# Patient Record
Sex: Female | Born: 1955 | Race: White | Hispanic: No | Marital: Married | Smoking: Former smoker
Health system: Southern US, Community
[De-identification: ages and names within clinical notes are randomized; demographics above are authoritative.]

## PROBLEM LIST (undated history)

## (undated) DIAGNOSIS — E119 Type 2 diabetes mellitus without complications: Secondary | ICD-10-CM

## (undated) DIAGNOSIS — I5189 Other ill-defined heart diseases: Secondary | ICD-10-CM

## (undated) DIAGNOSIS — M4306 Spondylolysis, lumbar region: Secondary | ICD-10-CM

## (undated) DIAGNOSIS — K259 Gastric ulcer, unspecified as acute or chronic, without hemorrhage or perforation: Secondary | ICD-10-CM

## (undated) DIAGNOSIS — L732 Hidradenitis suppurativa: Secondary | ICD-10-CM

## (undated) DIAGNOSIS — E785 Hyperlipidemia, unspecified: Secondary | ICD-10-CM

## (undated) DIAGNOSIS — I259 Chronic ischemic heart disease, unspecified: Secondary | ICD-10-CM

## (undated) DIAGNOSIS — K219 Gastro-esophageal reflux disease without esophagitis: Secondary | ICD-10-CM

## (undated) DIAGNOSIS — M4302 Spondylolysis, cervical region: Secondary | ICD-10-CM

## (undated) DIAGNOSIS — E669 Obesity, unspecified: Secondary | ICD-10-CM

## (undated) DIAGNOSIS — I240 Acute coronary thrombosis not resulting in myocardial infarction: Secondary | ICD-10-CM

## (undated) DIAGNOSIS — R112 Nausea with vomiting, unspecified: Secondary | ICD-10-CM

## (undated) DIAGNOSIS — M5412 Radiculopathy, cervical region: Secondary | ICD-10-CM

## (undated) DIAGNOSIS — I10 Essential (primary) hypertension: Secondary | ICD-10-CM

## (undated) DIAGNOSIS — M069 Rheumatoid arthritis, unspecified: Secondary | ICD-10-CM

## (undated) DIAGNOSIS — I219 Acute myocardial infarction, unspecified: Secondary | ICD-10-CM

## (undated) DIAGNOSIS — I2510 Atherosclerotic heart disease of native coronary artery without angina pectoris: Secondary | ICD-10-CM

## (undated) DIAGNOSIS — Z9889 Other specified postprocedural states: Secondary | ICD-10-CM

## (undated) HISTORY — DX: Obesity, unspecified: E66.9

## (undated) HISTORY — PX: ABDOMINAL HYSTERECTOMY: SHX81

## (undated) HISTORY — DX: Essential (primary) hypertension: I10

## (undated) HISTORY — DX: Other ill-defined heart diseases: I51.89

## (undated) HISTORY — DX: Rheumatoid arthritis, unspecified: M06.9

## (undated) HISTORY — DX: Atherosclerotic heart disease of native coronary artery without angina pectoris: I25.10

## (undated) HISTORY — DX: Acute myocardial infarction, unspecified: I21.9

## (undated) HISTORY — DX: Hyperlipidemia, unspecified: E78.5

## (undated) SURGERY — LEFT HEART CATH AND CORONARY ANGIOGRAPHY
Anesthesia: Moderate Sedation

---

## 2003-11-16 ENCOUNTER — Ambulatory Visit: Payer: Self-pay | Admitting: Family Medicine

## 2008-07-06 ENCOUNTER — Ambulatory Visit: Payer: Self-pay | Admitting: Family Medicine

## 2009-05-01 ENCOUNTER — Ambulatory Visit: Payer: Self-pay | Admitting: Family Medicine

## 2012-07-16 DIAGNOSIS — M47816 Spondylosis without myelopathy or radiculopathy, lumbar region: Secondary | ICD-10-CM | POA: Insufficient documentation

## 2012-07-16 DIAGNOSIS — L732 Hidradenitis suppurativa: Secondary | ICD-10-CM | POA: Insufficient documentation

## 2012-08-03 DIAGNOSIS — M5412 Radiculopathy, cervical region: Secondary | ICD-10-CM | POA: Insufficient documentation

## 2012-08-29 ENCOUNTER — Emergency Department: Payer: Self-pay | Admitting: Emergency Medicine

## 2012-08-29 LAB — COMPREHENSIVE METABOLIC PANEL
Alkaline Phosphatase: 83 U/L (ref 50–136)
Anion Gap: 9 (ref 7–16)
BUN: 11 mg/dL (ref 7–18)
Bilirubin,Total: 0.3 mg/dL (ref 0.2–1.0)
Calcium, Total: 9.4 mg/dL (ref 8.5–10.1)
EGFR (Non-African Amer.): >60
Glucose: 196 mg/dL — ABNORMAL HIGH (ref 65–99)
Osmolality: 279 (ref 275–301)
Potassium: 3.4 mmol/L — ABNORMAL LOW (ref 3.5–5.1)
SGOT(AST): 29 U/L (ref 15–37)
SGPT (ALT): 41 U/L (ref 12–78)
Sodium: 137 mmol/L (ref 136–145)

## 2012-08-29 LAB — CBC
Platelet: 448 10*3/uL — ABNORMAL HIGH (ref 150–440)
RBC: 4.54 10*6/uL (ref 3.80–5.20)
RDW: 13.8 % (ref 11.5–14.5)

## 2012-08-29 LAB — LIPASE, BLOOD: Lipase: 111 U/L (ref 73–393)

## 2014-05-27 ENCOUNTER — Inpatient Hospital Stay
Admission: EM | Admit: 2014-05-27 | Discharge: 2014-05-31 | DRG: 247 | Disposition: A | Discharge status: Home / Self Care | Payer: Medicare Other | Attending: Internal Medicine | Admitting: Internal Medicine

## 2014-05-27 DIAGNOSIS — I361 Nonrheumatic tricuspid (valve) insufficiency: Secondary | ICD-10-CM | POA: Diagnosis not present

## 2014-05-27 DIAGNOSIS — I200 Unstable angina: Secondary | ICD-10-CM | POA: Diagnosis not present

## 2014-05-27 DIAGNOSIS — E781 Pure hyperglyceridemia: Secondary | ICD-10-CM | POA: Diagnosis present

## 2014-05-27 DIAGNOSIS — E669 Obesity, unspecified: Secondary | ICD-10-CM | POA: Diagnosis present

## 2014-05-27 DIAGNOSIS — Z79899 Other long term (current) drug therapy: Secondary | ICD-10-CM

## 2014-05-27 DIAGNOSIS — I214 Non-ST elevation (NSTEMI) myocardial infarction: Secondary | ICD-10-CM | POA: Diagnosis present

## 2014-05-27 DIAGNOSIS — M069 Rheumatoid arthritis, unspecified: Secondary | ICD-10-CM | POA: Diagnosis present

## 2014-05-27 DIAGNOSIS — I10 Essential (primary) hypertension: Secondary | ICD-10-CM | POA: Diagnosis present

## 2014-05-27 DIAGNOSIS — I2510 Atherosclerotic heart disease of native coronary artery without angina pectoris: Secondary | ICD-10-CM | POA: Diagnosis present

## 2014-05-27 DIAGNOSIS — Z6830 Body mass index (BMI) 30.0-30.9, adult: Secondary | ICD-10-CM | POA: Diagnosis not present

## 2014-05-27 DIAGNOSIS — E785 Hyperlipidemia, unspecified: Secondary | ICD-10-CM | POA: Diagnosis present

## 2014-05-27 HISTORY — DX: Non-ST elevation (NSTEMI) myocardial infarction: I21.4

## 2014-05-27 LAB — BASIC METABOLIC PANEL
Anion Gap: 9 (ref 7–16)
BUN: 16 mg/dL
Calcium, Total: 9.5 mg/dL
Chloride: 97 mmol/L — ABNORMAL LOW
Co2: 31 mmol/L
Creatinine: 0.79 mg/dL
GLUCOSE: 243 mg/dL — AB
Potassium: 4.9 mmol/L
Sodium: 137 mmol/L

## 2014-05-27 LAB — CBC
HCT: 41.6 % (ref 35.0–47.0)
HGB: 13.4 g/dL (ref 12.0–16.0)
MCH: 28.5 pg (ref 26.0–34.0)
MCHC: 32.2 g/dL (ref 32.0–36.0)
MCV: 89 fL (ref 80–100)
Platelet: 467 10*3/uL — ABNORMAL HIGH (ref 150–440)
RBC: 4.69 10*6/uL (ref 3.80–5.20)
RDW: 13.5 % (ref 11.5–14.5)
WBC: 17.3 10*3/uL — ABNORMAL HIGH (ref 3.6–11.0)

## 2014-05-27 LAB — TROPONIN I
Troponin-I: 0.2 ng/mL — ABNORMAL HIGH
Troponin-I: 0.62 ng/mL — ABNORMAL HIGH

## 2014-05-27 LAB — CK-MB
CK-MB: 3.7 ng/mL
CK-MB: 4.4 ng/mL

## 2014-05-28 DIAGNOSIS — I361 Nonrheumatic tricuspid (valve) insufficiency: Secondary | ICD-10-CM

## 2014-05-28 DIAGNOSIS — I214 Non-ST elevation (NSTEMI) myocardial infarction: Secondary | ICD-10-CM | POA: Diagnosis present

## 2014-05-28 LAB — HEMOGLOBIN A1C: Hemoglobin A1C: 7.3 % — ABNORMAL HIGH

## 2014-05-28 LAB — LIPID PANEL
Cholesterol: 324 mg/dL — ABNORMAL HIGH
HDL Cholesterol: 36 mg/dL — ABNORMAL LOW
TRIGLYCERIDES: 658 mg/dL — AB

## 2014-05-28 LAB — TROPONIN I: Troponin-I: 0.81 ng/mL — ABNORMAL HIGH

## 2014-05-28 LAB — CK-MB: CK-MB: 5.3 ng/mL — ABNORMAL HIGH

## 2014-05-28 MED ORDER — ATENOLOL 50 MG PO TABS
50.0000 mg | ORAL_TABLET | Freq: Every day | ORAL | Status: DC
  Administered 2014-05-29 – 2014-05-31 (×3): 50 mg via ORAL
  Filled 2014-05-28 (×3): qty 1

## 2014-05-28 MED ORDER — SIMVASTATIN 40 MG PO TABS
40.0000 mg | ORAL_TABLET | Freq: Every day | ORAL | Status: DC
  Administered 2014-05-29 – 2014-05-30 (×2): 40 mg via ORAL
  Filled 2014-05-28 (×2): qty 1

## 2014-05-28 MED ORDER — LEFLUNOMIDE 10 MG PO TABS
10.0000 mg | ORAL_TABLET | Freq: Every day | ORAL | Status: DC
  Administered 2014-05-29 – 2014-05-31 (×2): 10 mg via ORAL

## 2014-05-28 MED ORDER — INSULIN ASPART 100 UNIT/ML ~~LOC~~ SOLN
0.0000 [IU] | Freq: Three times a day (TID) | SUBCUTANEOUS | Status: DC
  Administered 2014-05-29 – 2014-05-30 (×3): 2 [IU] via SUBCUTANEOUS
  Filled 2014-05-28 (×2): qty 2

## 2014-05-28 MED ORDER — SODIUM CHLORIDE 0.9 % IJ SOLN: 10.0000 mL | INTRAMUSCULAR | Status: DC | PRN

## 2014-05-28 MED ORDER — SUCRALFATE 1 G PO TABS
1.0000 g | ORAL_TABLET | Freq: Three times a day (TID) | ORAL | Status: DC
  Administered 2014-05-29 – 2014-05-31 (×7): 1 g via ORAL
  Filled 2014-05-28 (×7): qty 1

## 2014-05-28 MED ORDER — SENNA 8.6 MG PO TABS
1.0000 | ORAL_TABLET | Freq: Two times a day (BID) | ORAL | Status: DC | PRN
  Filled 2014-05-28: qty 1

## 2014-05-28 MED ORDER — HYDROCODONE-ACETAMINOPHEN 5-325 MG PO TABS
1.0000 | ORAL_TABLET | ORAL | Status: DC | PRN
  Administered 2014-05-29 – 2014-05-30 (×4): 1 via ORAL
  Filled 2014-05-28 (×4): qty 1

## 2014-05-28 MED ORDER — ADULT MULTIVITAMIN W/MINERALS CH
1.0000 | ORAL_TABLET | Freq: Every day | ORAL | Status: DC
  Administered 2014-05-29 – 2014-05-31 (×2): 1 via ORAL
  Filled 2014-05-28 (×4): qty 1

## 2014-05-28 MED ORDER — GABAPENTIN 300 MG PO CAPS
600.0000 mg | ORAL_CAPSULE | Freq: Two times a day (BID) | ORAL | Status: DC
  Administered 2014-05-29 – 2014-05-31 (×5): 600 mg via ORAL
  Filled 2014-05-28 (×5): qty 2

## 2014-05-28 MED ORDER — DOCUSATE SODIUM 100 MG PO CAPS: 100.0000 mg | ORAL_CAPSULE | Freq: Two times a day (BID) | ORAL | Status: DC | PRN

## 2014-05-28 MED ORDER — PANTOPRAZOLE SODIUM 40 MG PO TBEC
40.0000 mg | DELAYED_RELEASE_TABLET | Freq: Every day | ORAL | Status: DC
  Administered 2014-05-29 – 2014-05-31 (×3): 40 mg via ORAL
  Filled 2014-05-28 (×3): qty 1

## 2014-05-28 MED ORDER — ACETAMINOPHEN 325 MG PO TABS: 650.0000 mg | ORAL_TABLET | ORAL | Status: DC | PRN

## 2014-05-28 MED ORDER — NITROGLYCERIN 0.1 MG/HR TD PT24
0.1000 mg | MEDICATED_PATCH | Freq: Every day | TRANSDERMAL | Status: DC
  Administered 2014-05-29 – 2014-05-31 (×3): 0.1 mg via TRANSDERMAL
  Filled 2014-05-28 (×5): qty 1

## 2014-05-28 MED ORDER — ENOXAPARIN SODIUM 100 MG/ML ~~LOC~~ SOLN
90.0000 mg | Freq: Two times a day (BID) | SUBCUTANEOUS | Status: DC
Start: 2014-05-29 — End: 2014-05-30
  Administered 2014-05-29 (×2): 90 mg via SUBCUTANEOUS
  Filled 2014-05-28 (×2): qty 1

## 2014-05-28 MED ORDER — CYCLOBENZAPRINE HCL 10 MG PO TABS
10.0000 mg | ORAL_TABLET | Freq: Two times a day (BID) | ORAL | Status: DC | PRN
  Administered 2014-05-29 – 2014-05-30 (×3): 10 mg via ORAL
  Filled 2014-05-28 (×3): qty 1

## 2014-05-28 MED ORDER — ONDANSETRON HCL 4 MG/2ML IJ SOLN: 4.0000 mg | INTRAMUSCULAR | Status: DC | PRN

## 2014-05-29 DIAGNOSIS — I249 Acute ischemic heart disease, unspecified: Secondary | ICD-10-CM

## 2014-05-29 LAB — GLUCOSE, CAPILLARY: GLUCOSE-CAPILLARY: 144 mg/dL — AB (ref 70–99)

## 2014-05-29 MED ORDER — SODIUM CHLORIDE 0.9 % IJ SOLN
3.0000 mL | Freq: Two times a day (BID) | INTRAMUSCULAR | Status: DC
Start: 2014-05-29 — End: 2014-05-30
  Administered 2014-05-29 (×2): 3 mL via INTRAVENOUS

## 2014-05-29 MED ORDER — SODIUM CHLORIDE 0.9 % IV SOLN: 1.0000 mL/kg/h | INTRAVENOUS | Status: DC

## 2014-05-29 MED ORDER — SODIUM CHLORIDE 0.9 % IV SOLN
3.0000 mL/kg/h | INTRAVENOUS | Status: AC
  Administered 2014-05-30: 3 mL/kg/h via INTRAVENOUS

## 2014-05-29 MED ORDER — SODIUM CHLORIDE 0.9 % IV SOLN: 250.0000 mL | INTRAVENOUS | Status: DC | PRN

## 2014-05-29 MED ORDER — ASPIRIN 81 MG PO CHEW
81.0000 mg | CHEWABLE_TABLET | ORAL | Status: AC
  Administered 2014-05-30: 81 mg via ORAL
  Filled 2014-05-29: qty 1

## 2014-05-29 NOTE — Progress Notes (Signed)
Subjective ;59 year old female patient admitted for non-Q-wave MI. She is been chest pain-free since admission. Seen by cardiology, possible cardiac catheter tomorrow morning.:  Continue her aspirin, beta blockers, statins, nitrates and full dose Lovenox. Patient wants to go to and see if she requires a bypass surgery. Denies any other complaints.   Objective: Vital signs in last 24 hours: Temp:  [98 F (36.7 C)] 98 F (36.7 C) (05/01 0457) Pulse Rate:  [80] 80 (05/01 0457) BP: (154)/(80) 154/80 mmHg (05/01 0457) SpO2:  [98 %] 98 % (05/01 0457) Weight:  [89.54 kg (197 lb 6.4 oz)-91.808 kg (202 lb 6.4 oz)] 89.54 kg (197 lb 6.4 oz) (05/01 0457) Weight change:     Intake/Output from previous day:   Intake/Output this shift:    General appearance: cooperative Head: Normocephalic, without obvious abnormality, atraumatic Resp: clear to auscultation bilaterally Cardio: regular rate and rhythm, S1, S2 normal, no murmur, click, rub or gallop Skin: Skin color, texture, turgor normal. No rashes or lesions  Lab Results:  Recent Labs  05/27/14 1717  WBC 17.3*  HGB 13.4  HCT 41.6  PLT 467*   BMET  Recent Labs  05/27/14 1717  CO2 31  BUN 16  CREATININE 0.79  CALCIUM 9.5    Studies/Results: Dg Chest Port 1 View  05/27/2014   CLINICAL DATA:  Chest pain radiating up into jaw and down into left arm for 2 days  EXAM: PORTABLE CHEST - 1 VIEW  COMPARISON:  None.  FINDINGS: The heart size and mediastinal contours are within normal limits. Both lungs are clear. The visualized skeletal structures are unremarkable.  IMPRESSION: No active disease.   Electronically Signed   By: Skipper Cliche M.D.   On: 05/27/2014 19:49    Medications: I have reviewed the patient's current medications.  Assessment/Plan: 59 year old female patient with non-Q-wave MI: Continue aspirin, beta blockers, statins, nitrates, full dose Lovenox. Continue monitoring on telemetry check echocardiogram results,  possible cardiac catheter tomorrow morning. Rheumatoid arthritis rheumatoid arthritis: Continue her home medication mainly leflunomide, prednisone, methotrexate. Leg cramps. Follow up with her rheumatologist. Conservative management.   LOS: 2 days   Harry Shuck 05/29/2014, 8:55 AM

## 2014-05-29 NOTE — Progress Notes (Signed)
Pt remain alert and oriented, compliants of pain assoc with RA, OOB with minimal assist, daughters at bedside, cardiac cath tomorrow, SR on telemetry.

## 2014-05-29 NOTE — Care Management Note (Signed)
Case Management Note  Patient Details  Name: IZETTA SAKAMOTO MRN: [id number] Date of Birth: Jul 29, 1955  Subjective/Objective:                     Order present for CM assessment for discharge planning.  Patient admitted with chest pain.  Troponins elevated and it is documented patient has had nstemi.  Cardiology consult performed and cardiac cath is pending   Patient presents from home and independent in all adls.   Has health insurance.  Denies issues accessing medical care, obtaining medications, maintaining housing, utilities and food.     Action/Plan:       Await cath result and watch for need to anticoagulation meds   Expected Discharge Date:    05/30/2014 if cath does not require intervention              Expected Discharge Plan:     In-House Referral:     Discharge planning Services     None identified at present  Post Acute Care Choice:    Choice offered to:     DME Arranged:    DME Agency:     HH Arranged:    [place] Agency:     Status of Service:     Medicare Important Message Given:    Date Medicare IM Given:    Medicare IM give by:    Date Additional Medicare IM Given:    Additional Medicare Important Message give by:     If discussed at [place] of Stay Meetings, dates discussed:    Additional Comments:  Katrina Stack, RN 05/29/2014, 3:17 PM

## 2014-05-29 NOTE — Progress Notes (Addendum)
Patient ID: Karen Cervantes, female   DOB: 1955-07-20, 59 y.o.   MRN: [id number]    Patient Name: Karen Cervantes Date of Encounter: 05/29/2014     Active Problems:   Acute coronary syndrome    SUBJECTIVE  No additional chest or jaw pain or sob.  CURRENT MEDS . atenolol  50 mg Oral Daily  . enoxaparin (LOVENOX) injection  90 mg Subcutaneous Q12H  . gabapentin  600 mg Oral BID  . insulin aspart  0-10 Units Subcutaneous TID AC & HS  . leflunomide  10 mg Oral Daily  . multivitamin with minerals  1 tablet Oral Daily  . nitroGLYCERIN  0.1 mg Transdermal Q0600  . pantoprazole  40 mg Oral Daily  . simvastatin  40 mg Oral QHS  . sucralfate  1 g Oral TID AC & HS    OBJECTIVE  Filed Vitals:   05/28/14 0906 05/29/14 0457 05/29/14 1145  BP:  154/80 125/72  Pulse:  80 73  Temp:  98 F (36.7 C) 98.3 F (36.8 C)  TempSrc:  Oral Oral  Resp:   19  Height: 5\' 3"  (1.6 m)    Weight: 202 lb 6.4 oz (91.808 kg) 197 lb 6.4 oz (89.54 kg)   SpO2:  98% 96%    Intake/Output Summary (Last 24 hours) at 05/29/14 1230 Last data filed at 05/29/14 0840  Gross per 24 hour  Intake    240 ml  Output      0 ml  Net    240 ml   Filed Weights   05/28/14 0906 05/29/14 0457  Weight: 202 lb 6.4 oz (91.808 kg) 197 lb 6.4 oz (89.54 kg)    PHYSICAL EXAM  General: Pleasant, NAD. Neuro: Alert and oriented X 3. Moves all extremities spontaneously. Psych: Normal affect. HEENT:  Normal  Neck: Supple without bruits or JVD. Lungs:  Resp regular and unlabored, CTA. Heart: RRR no s3, s4, or murmurs. Abdomen: Soft, non-tender, non-distended, BS + x 4.  Extremities: No clubbing, cyanosis or edema. DP/PT/Radials 2+ and equal bilaterally.  Accessory Clinical Findings  CBC  Recent Labs  05/27/14 1717  WBC 17.3*  HGB 13.4  HCT 41.6  MCV 89  PLT 637*   Basic Metabolic Panel  Recent Labs  05/27/14 1717  CO2 31  BUN 16  CREATININE 0.79  CALCIUM 9.5   Liver Function Tests No results for  input(s): AST, ALT, ALKPHOS, BILITOT, PROT, ALBUMIN in the last 72 hours. No results for input(s): LIPASE, AMYLASE in the last 72 hours. Cardiac Enzymes No results for input(s): CKTOTAL, CKMB, CKMBINDEX, TROPONINI in the last 72 hours. BNP Invalid input(s): POCBNP D-Dimer No results for input(s): DDIMER in the last 72 hours. Hemoglobin A1C No results for input(s): HGBA1C in the last 72 hours. Fasting Lipid Panel No results for input(s): CHOL, HDL, LDLCALC, TRIG, CHOLHDL, LDLDIRECT in the last 72 hours. Thyroid Function Tests No results for input(s): TSH, T4TOTAL, T3FREE, THYROIDAB in the last 72 hours.  Invalid input(s): FREET3  TELE  NSR  Radiology/Studies  Dg Chest Port 1 View  05/27/2014   CLINICAL DATA:  Chest pain radiating up into jaw and down into left arm for 2 days  EXAM: PORTABLE CHEST - 1 VIEW  COMPARISON:  None.  FINDINGS: The heart size and mediastinal contours are within normal limits. Both lungs are clear. The visualized skeletal structures are unremarkable.  IMPRESSION: No active disease.   Electronically Signed   By: Elodia Florence.D.  On: 05/27/2014 19:49    ASSESSMENT AND PLAN  1. USA/NSTEMI - she is pain free. Will plan left heart cath tomorrow. She will continue her current meds. 2. Dyslipidemia - her cholesterol and triglycerides are very high. Will start statin therapy.  3. Obesity - weight loss has been discussed. She is limited in her activity by arthritis.   Gregg Taylor,M.D.  05/29/2014 12:30 PM    Echocardiogram: Normal LV function, no WMA, EF >00% Diastolic relaxation ABN noted No significant valve pathology Normal RVSP  Esmond Plants, M.D.

## 2014-05-29 NOTE — Progress Notes (Signed)
Pt is alert and oriented. Pt family had multiple questions and concerns about pt status. Family was unaware of when pt will be doing cardiac cath and family wishes for pt to go to [facility] for surgery. All family and pt questions and concern were addressed. Pt report pain once during shift. No other signs of distress noted. Will continue to monitor.

## 2014-05-30 ENCOUNTER — Other Ambulatory Visit: Payer: Self-pay

## 2014-05-30 ENCOUNTER — Encounter
Admission: EM | Disposition: A | Discharge status: Home / Self Care | Payer: Medicare Other | Source: Home / Self Care | Attending: Internal Medicine

## 2014-05-30 DIAGNOSIS — E785 Hyperlipidemia, unspecified: Secondary | ICD-10-CM

## 2014-05-30 DIAGNOSIS — I2510 Atherosclerotic heart disease of native coronary artery without angina pectoris: Secondary | ICD-10-CM

## 2014-05-30 HISTORY — PX: CARDIAC CATHETERIZATION: SHX172

## 2014-05-30 LAB — GLUCOSE, CAPILLARY
GLUCOSE-CAPILLARY: 120 mg/dL — AB (ref 70–99)
GLUCOSE-CAPILLARY: 124 mg/dL — AB (ref 70–99)
Glucose-Capillary: 150 mg/dL — ABNORMAL HIGH (ref 70–99)
Glucose-Capillary: 164 mg/dL — ABNORMAL HIGH (ref 70–99)

## 2014-05-30 SURGERY — LEFT HEART CATH AND CORONARY ANGIOGRAPHY
Anesthesia: Moderate Sedation

## 2014-05-30 MED ORDER — NITROGLYCERIN 5 MG/ML IV SOLN
INTRAVENOUS | Status: AC
  Filled 2014-05-30: qty 10

## 2014-05-30 MED ORDER — ASPIRIN 81 MG PO CHEW
CHEWABLE_TABLET | ORAL | Status: AC
  Filled 2014-05-30: qty 3

## 2014-05-30 MED ORDER — BIVALIRUDIN 250 MG IV SOLR
INTRAVENOUS | Status: AC
  Filled 2014-05-30: qty 250

## 2014-05-30 MED ORDER — IOHEXOL 350 MG/ML SOLN
INTRAVENOUS | Status: DC | PRN
  Administered 2014-05-30 (×3): 75 mL via INTRA_ARTERIAL

## 2014-05-30 MED ORDER — SODIUM CHLORIDE 0.9 % IJ SOLN: 3.0000 mL | INTRAMUSCULAR | Status: DC | PRN

## 2014-05-30 MED ORDER — ASPIRIN 81 MG PO CHEW
CHEWABLE_TABLET | ORAL | Status: DC | PRN
  Administered 2014-05-30: 243 mg via ORAL

## 2014-05-30 MED ORDER — HEPARIN SODIUM (PORCINE) 1000 UNIT/ML IJ SOLN
INTRAMUSCULAR | Status: AC
  Filled 2014-05-30: qty 1

## 2014-05-30 MED ORDER — VERAPAMIL HCL 2.5 MG/ML IV SOLN
INTRAVENOUS | Status: AC
  Filled 2014-05-30: qty 2

## 2014-05-30 MED ORDER — SODIUM CHLORIDE 0.9 % IV SOLN
250.0000 mL | INTRAVENOUS | Status: DC | PRN
  Administered 2014-05-30: 250 mL via INTRAVENOUS

## 2014-05-30 MED ORDER — NITROGLYCERIN IN D5W 200-5 MCG/ML-% IV SOLN: 0.0000 ug/min | INTRAVENOUS | Status: DC

## 2014-05-30 MED ORDER — SODIUM CHLORIDE 0.9 % WEIGHT BASED INFUSION
1.0000 mL/kg/h | INTRAVENOUS | Status: AC
  Administered 2014-05-30: 1 mL/kg/h via INTRAVENOUS

## 2014-05-30 MED ORDER — FENTANYL CITRATE (PF) 100 MCG/2ML IJ SOLN
INTRAMUSCULAR | Status: DC | PRN
  Administered 2014-05-30: 50 ug via INTRAVENOUS

## 2014-05-30 MED ORDER — TICAGRELOR 90 MG PO TABS
90.0000 mg | ORAL_TABLET | Freq: Two times a day (BID) | ORAL | Status: DC
  Administered 2014-05-30 – 2014-05-31 (×2): 90 mg via ORAL
  Filled 2014-05-30 (×2): qty 1

## 2014-05-30 MED ORDER — FENTANYL CITRATE (PF) 100 MCG/2ML IJ SOLN
INTRAMUSCULAR | Status: AC
  Filled 2014-05-30: qty 2

## 2014-05-30 MED ORDER — TICAGRELOR 90 MG PO TABS
ORAL_TABLET | ORAL | Status: AC
  Filled 2014-05-30: qty 2

## 2014-05-30 MED ORDER — MIDAZOLAM HCL 2 MG/2ML IJ SOLN
INTRAMUSCULAR | Status: DC | PRN
  Administered 2014-05-30: 1 mg via INTRAVENOUS

## 2014-05-30 MED ORDER — SODIUM CHLORIDE 0.9 % IJ SOLN
3.0000 mL | Freq: Two times a day (BID) | INTRAMUSCULAR | Status: DC
  Administered 2014-05-31: 3 mL via INTRAVENOUS
  Filled 2014-05-30: qty 10

## 2014-05-30 MED ORDER — VERAPAMIL HCL 2.5 MG/ML IV SOLN
INTRAVENOUS | Status: DC | PRN
  Administered 2014-05-30: 2.5 mg via INTRA_ARTERIAL

## 2014-05-30 MED ORDER — NITROGLYCERIN 0.2 MG/ML ON CALL CATH LAB
INTRAVENOUS | Status: DC | PRN
  Administered 2014-05-30: 200 ug via INTRAVENOUS

## 2014-05-30 MED ORDER — TICAGRELOR 90 MG PO TABS
ORAL_TABLET | ORAL | Status: DC | PRN
  Administered 2014-05-30: 180 mg via ORAL

## 2014-05-30 MED ORDER — BIVALIRUDIN BOLUS VIA INFUSION
INTRAVENOUS | Status: DC | PRN
  Administered 2014-05-30: 66.45 mg via INTRAVENOUS

## 2014-05-30 MED ORDER — SODIUM CHLORIDE 0.9 % IV SOLN
INTRAVENOUS | Status: DC | PRN
  Administered 2014-05-30: 250 mL via INTRAVENOUS

## 2014-05-30 MED ORDER — LIDOCAINE HCL (PF) 1 % IJ SOLN
INTRAMUSCULAR | Status: AC
  Filled 2014-05-30: qty 30

## 2014-05-30 MED ORDER — METOPROLOL TARTRATE 1 MG/ML IV SOLN
INTRAVENOUS | Status: AC
Start: 2014-05-30 — End: 2014-05-31
  Filled 2014-05-30: qty 5

## 2014-05-30 MED ORDER — HEPARIN (PORCINE) IN NACL 2-0.9 UNIT/ML-% IJ SOLN
INTRAMUSCULAR | Status: AC
  Filled 2014-05-30: qty 1000

## 2014-05-30 MED ORDER — MIDAZOLAM HCL 2 MG/2ML IJ SOLN
INTRAMUSCULAR | Status: AC
  Filled 2014-05-30: qty 2

## 2014-05-30 MED ORDER — SODIUM CHLORIDE 0.9 % IV SOLN
250.0000 mg | INTRAVENOUS | Status: DC | PRN
  Administered 2014-05-30: 1.75 mg/kg/h via INTRAVENOUS

## 2014-05-30 MED ORDER — HEPARIN SODIUM (PORCINE) 5000 UNIT/ML IJ SOLN
5000.0000 [IU] | Freq: Three times a day (TID) | INTRAMUSCULAR | Status: DC
  Administered 2014-05-30 – 2014-05-31 (×2): 5000 [IU] via SUBCUTANEOUS
  Filled 2014-05-30 (×2): qty 1

## 2014-05-30 MED ORDER — METOPROLOL TARTRATE 1 MG/ML IV SOLN
INTRAVENOUS | Status: DC | PRN
  Administered 2014-05-30: 2.5 mg via INTRAVENOUS

## 2014-05-30 SURGICAL SUPPLY — 22 items
BALLN TREK RX 2.5X12 (BALLOONS) ×2
BALLN ~~LOC~~ TREK RX 3.75X12 (BALLOONS) ×2
BALLOON TREK RX 2.5X12 (BALLOONS) ×1 IMPLANT
BALLOON ~~LOC~~ TREK RX 3.75X12 (BALLOONS) ×1 IMPLANT
CATH INFINITI 5 FR 3DRC (CATHETERS) ×2 IMPLANT
CATH INFINITI 5FR ANG PIGTAIL (CATHETERS) IMPLANT
CATH INFINITI 5FR JL4 (CATHETERS) ×2 IMPLANT
CATH INFINITI JR4 5F (CATHETERS) ×2 IMPLANT
CATH OPTITORQUE JACKY 4.0 5F (CATHETERS) ×2 IMPLANT
CATH VISTA GUIDE 6FR XBLAD3.5 (CATHETERS) ×2 IMPLANT
DEVICE PRESTO INFLATION (MISCELLANEOUS) ×2 IMPLANT
DEVICE RAD TR BAND REGULAR (VASCULAR PRODUCTS) ×2 IMPLANT
GLIDESHEATH SLEND SS 6F .021 (SHEATH) ×2 IMPLANT
NEEDLE PERC 18GX7CM (NEEDLE) ×2 IMPLANT
PACK CARDIAC CATH (CUSTOM PROCEDURE TRAY) ×2 IMPLANT
SHEATH PINNACLE 5F 10CM (SHEATH) ×2 IMPLANT
SHEATH PINNACLE 6F 10CM (SHEATH) ×2 IMPLANT
STENT XIENCE ALPINE RX 3.5X18 (Permanent Stent) ×2 IMPLANT
WIRE EMERALD 3MM-J .035X150CM (WIRE) ×2 IMPLANT
WIRE HITORQ VERSACORE ST 145CM (WIRE) ×2 IMPLANT
WIRE RUNTHROUGH .014X180CM (WIRE) ×2 IMPLANT
WIRE SAFE-T 1.5MM-J .035X260CM (WIRE) ×2 IMPLANT

## 2014-05-30 NOTE — Care Management (Signed)
Informed that patient's daughters needed to speak with CM.  They mention that they have been wanting patient to transfer to [facility] as patient's specialty physician practices at that facility.  Says they asked at time of admission.  At time of conversation, patient had just finished cardiac cath resulting in PCI of the RCA.  Daughters are no longer interested in transferring as patient is most likely going to be discharged home tomorrow.

## 2014-05-30 NOTE — H&P (Signed)
PATIENT NAME:  Karen Cervantes, Karen Cervantes MR#:  [id number] DATE OF BIRTH:  Dec 21, 1955  DATE OF ADMISSION:  05/27/2014   PRIMARY CARE PHYSICIAN: Dr. Denton Lank at the [facility].   CHIEF COMPLAINT: Chest pain today.  HISTORY OF PRESENT ILLNESS: Karen Cervantes is a very pleasant, 59 year old, Caucasian female with past medical history of hypertension and rheumatoid arthritis, who comes to the Emergency Room after she started having feelings of chest soreness and heaviness across the chest and radiated to her jaw this afternoon. The patient denies any exertional activity. Denies diaphoresis or shortness of breath. Her chest pain resolved after she took a couple of baby aspirins. She came to the Emergency Room, her troponin is 0.20. EKG shows normal sinus rhythm with left atrial enlargement. Given her symptoms, risk factors and history of hypertension, she is being admitted with an acute STEMI.   PAST MEDICAL HISTORY: 1. Hypertension.  2. Rheumatoid arthritis. Her rheumatologist is at [facility].   ALLERGIES: REMICADE.   MEDICATIONS:  1. Xeljanz 5 mg p.o. b.i.d.  2. Prednisone 5 mg 2 tablets p.o. daily.  3. Omeprazole 20 mg 1 capsule p.o. daily.  4. Multivitamin 1 capsule daily.  5. Methotrexate 0.4 mL subcutaneously once a week. 6. Leflunomide 20 mg p.o. daily in the morning.  7. Gabapentin 300 mg 2 capsules b.i.d.  8. Folic acid 1 tablet p.o. daily.  9. Cyclobenzaprine 10 mg 1 tablet b.i.d.  10. Atenolol 50 mg p.o. daily.  11. Acetaminophen/hydrocodone 5/325 one tablet every 4 hours   REVIEW OF SYSTEMS:    CONSTITUTIONAL: No fever, fatigue, or weakness.  EYES: No blurred or double vision, or cataract.  ENT: No tinnitus, ear pain, or postnasal drip.  RESPIRATORY: No cough, wheeze, hemoptysis.  CARDIOVASCULAR: Positive chest pain, which resolved, hypertension, no dyspnea on exertion, palpitations.  GASTROINTESTINAL: No nausea, vomiting, diarrhea, abdominal pain.  GENITOURINARY: No dysuria, hematuria,  frequency.  ENDOCRINE: No polyuria, nocturia or thyroid problems.  HEMATOLOGY: No anemia, easy bruising or bleeding.  SKIN: No acne, rash, or lesion.  MUSCULOSKELETAL: Positive for arthritis. No swelling or gout.  NEUROLOGIC: No CVA, TIA, ataxia, or dementia.  PSYCHIATRIC: No anxiety, depression, or bipolar disorder. All other systems reviewed and negative.   PHYSICAL EXAMINATION:  GENERAL: The patient is awake, alert, oriented x 3, not in acute distress.  VITAL SIGNS: Afebrile. Pulse is 79, blood pressure is 179/86. Saturations are 97% on room air.  HEENT: Atraumatic, normocephalic. PERRLA. EOMI intact. Oral mucosa is moist.  NECK: Supple. No JVD. No carotid bruit.  RESPIRATORY: Clear to auscultation bilaterally. No rales, rhonchi, respiratory distress, or labored breathing.  CARDIOVASCULAR: Both the heart sounds are normal rate, rhythm regular. PMI not lateralized. Chest is nontender.  EXTREMITIES: Good pedal pulses, good femoral pulses. No lower extremity edema.  ABDOMEN: Soft, benign, nontender. No organomegaly. Positive bowel sounds.  NEUROLOGIC: Grossly intact cranial nerves II through XII. No motor or sensory deficit.  PSYCHIATRIC: The patient is awake, alert, oriented x3.   LABORATORY DATA:  EKG shows normal sinus rhythm, left atrial enlargement changes possible. No acute ST elevation or depression.   Chest x-ray: No active disease.   Troponin 0.20. White count is 17.3. H and H is 13.4 and 41.6, glucose is 243, chloride is 97.  CK-MB is 3.7.   ASSESSMENT AND PLAN: [age]-year-old Karen Cervantes with history of hypertension and rheumatoid arthritis, comes in with:   1. Acute non-Q-wave myocardial infarction. The patient is admitted with chest pain radiating to the jaw.  Her troponin was 0.20. EKG does not show any acute ST elevation or depression. We will admit her. She is already on aspirin, atenolol, p.r.n. nitroglycerin, and give her Lovenox 1 mg/kg b.i.d. Cardiology  consultation with [facility]. Cycle cardiac enzymes x 3. Check lipid profile.  2. Hypertension. Continue her home medication, which is atenolol.  3. Rheumatoid arthritis. Continue her leflunomide, methotrexate, and Xeljanz, along with prednisone. 4.  Gastroesophageal reflux disease. Continue omeprazole. 5. Hyperglycemia. Her sugars are quite high. The patient has no history of diabetes. We will check her A1c. We will give her sliding scale insulin. If sugars continue to remain high with elevated A1c, consider starting oral diabetic agents. 6. Deep vein thrombosis prophylaxis. The patient is already on Lovenox.   CODE STATUS: The patient is a FULL CODE.   TIME SPENT: 50 minutes.    ____________________________ Hart Rochester Posey Pronto, MD sap:JT D: 05/27/2014 20:09:20 ET T: 05/27/2014 20:21:17 ET JOB#: [id number]  cc: Karen Mcweeney A. Posey Pronto, MD, <Dictator> Karen "Sallie" Posey Pronto, MD Ilda Basset MD ELECTRONICALLY SIGNED 05/28/2014 15:12

## 2014-05-30 NOTE — Consult Note (Addendum)
PATIENT NAME:  Karen Cervantes, Karen Cervantes MR#:  [id number] DATE OF BIRTH:  08/06/1955  DATE OF CONSULTATION:  05/28/2014  REFERRING PHYSICIAN:   CONSULTING PHYSICIAN:  Champ Mungo. Lovena Le, MD  REASON FOR CONSULTATION:  The consultation is requested for evaluation of non-ST elevation MI.  HISTORY OF PRESENT ILLNESS:   The patient is a very pleasant 59 year old woman with a history of rheumatoid arthritis and hypertension adenopathy obesity.   She was in her usual state of health until last night when she developed substernal chest pressure and jaw pain.   This was associated with shortness of breath.   The patient presented to the emergency room where she was treated with nitrates and morphine with resolution of her symptoms.   She also noted mild diaphoresis.   The patient has had no recurrent symptoms since she was treated with nitroglycerin and morphine.  She has no history of syncope.   She denies cough or pleuritic chest pain, fevers, or chills.   She denies constipation.  She is admitted for additional evaluation.   PAST MEDICAL HISTORY:   Is notable for the patient and her family moving from the [place] area.  She has been on multiple medications for her rheumatoid arthritis in the past.    FAMILY HISTORY:  Is very positive for a coronary artery disease with both mother and father having heart disease and multiple siblings.   SOCIAL HISTORY:   The patient denies tobacco or ethanol abuse, stopping tobacco approximately 25 years ago.   REVIEW OF SYSTEMS:   All negative except as noted in the HPI.    PHYSICAL EXAMINATION:     GENERAL:   The patient is a very pleasant 59 year old woman in no acute distress.   VITAL SIGNS:   The blood pressure was 179/86, the pulse was 97 and regular, respirations were 18, temperature was is a pleasant, but obese middle aged woman in no acute distress.  The blood pressure was 175/85, the pulse was 80 and regular, respirations were 18, temperature was at times  HEENT:    Normocephalic and atraumatic.  Pupils equal and round.   The oropharynx is moist.   The sclerae were anicteric.    NECK:   Revealed 7 cm jugular venous distention.   There is no thyromegaly.  There were no carotid bruits.  Trachea was midline.  LUNGS:   The lungs revealed rales in the bases bilaterally.   There were no wheezes or rhonchi and no increased work of breathing.   CARDIOVASCULAR:   Had a regular rate and rhythm with normal S1 and S2.    ABDOMEN:   Was soft, nontender.   There was obesity.   There was no organomegaly.   EXTREMITIES:   The extremities demonstrated no clubbing, cyanosis, or edema.   The pulses were 2+ and symmetric.    NEUROLOGICAL:   Alert and oriented x3.   Cranial nerves intact.   Strength was 5 out of 5 and symmetric.    EKG:   Demonstrated a sinus rhythm with no ST elevation.   IMPRESSION:  Non-ST elevation myocardial infarction, elevated troponin of unclear clinical circumstance.    PLAN:   We will plan on proceeding with catheterization early next week to define her coronary anatomy.   She will continue her anticoagulation.   She will continue aspirin.   We will try low dose beta blocker and nitrates as needed.    ____________________________ Champ Mungo. Lovena Le, MD gwt:tr D: 05/28/2014 14:51:29 ET T:  05/28/2014 17:28:17 ET JOB#: [id number]  cc: Champ Mungo. Lovena Le, MD, <Dictator> Dr. Cristopher Peru, M.D. ELECTRONICALLY SIGNED 06/16/2014 14:03

## 2014-05-30 NOTE — Progress Notes (Signed)
Patient ID: Karen Cervantes, female   DOB: 1955-08-15, 59 y.o.   MRN: [id number]    Patient Name: Karen Cervantes Date of Encounter: 05/30/2014     Active Problems:   Acute coronary syndrome    SUBJECTIVE  No additional chest or jaw pain or sob. Symptoms free since Friday.  CURRENT MEDS . atenolol  50 mg Oral Daily  . enoxaparin (LOVENOX) injection  90 mg Subcutaneous Q12H  . gabapentin  600 mg Oral BID  . insulin aspart  0-10 Units Subcutaneous TID AC & HS  . leflunomide  10 mg Oral Daily  . multivitamin with minerals  1 tablet Oral Daily  . nitroGLYCERIN  0.1 mg Transdermal Q0600  . pantoprazole  40 mg Oral Daily  . simvastatin  40 mg Oral QHS  . sodium chloride  3 mL Intravenous Q12H  . sucralfate  1 g Oral TID AC & HS    OBJECTIVE  Filed Vitals:   05/29/14 0457 05/29/14 1145 05/29/14 1943 05/30/14 0551  BP: 154/80 125/72 131/73 147/64  Pulse: 80 73 77 98  Temp: 98 F (36.7 C) 98.3 F (36.8 C) 98.6 F (37 C) 98.4 F (36.9 C)  TempSrc: Oral Oral Oral Oral  Resp:  19 18 18   Height:      Weight: 197 lb 6.4 oz (89.54 kg)     SpO2: 98% 96% 96% 97%    Intake/Output Summary (Last 24 hours) at 05/30/14 0820 Last data filed at 05/29/14 1725  Gross per 24 hour  Intake    480 ml  Output      0 ml  Net    480 ml   Filed Weights   05/28/14 0906 05/29/14 0457  Weight: 202 lb 6.4 oz (91.808 kg) 197 lb 6.4 oz (89.54 kg)    PHYSICAL EXAM  General: Pleasant, NAD. Neuro: Alert and oriented X 3. Moves all extremities spontaneously. Psych: Normal affect. HEENT:  Normal  Neck: Supple without bruits or JVD. Lungs:  Resp regular and unlabored, CTA. Heart: RRR no s3, s4, or murmurs. Abdomen: Soft, non-tender, non-distended, BS + x 4.  Extremities: No clubbing, cyanosis or edema. DP/PT/Radials 2+ and equal bilaterally.  Accessory Clinical Findings  CBC  Recent Labs  05/27/14 1717  WBC 17.3*  HGB 13.4  HCT 41.6  MCV 89  PLT 841*   Basic Metabolic Panel  Recent  Labs  05/27/14 1717  NA 137  K 4.9  CL 97*  CO2 31  GLUCOSE 243*  BUN 16  CREATININE 0.79  CALCIUM 9.5   Liver Function Tests No results for input(s): AST, ALT, ALKPHOS, BILITOT, PROT, ALBUMIN in the last 72 hours. No results for input(s): LIPASE, AMYLASE in the last 72 hours. Cardiac Enzymes No results for input(s): CKTOTAL, CKMB, CKMBINDEX, TROPONINI in the last 72 hours. BNP Invalid input(s): POCBNP D-Dimer No results for input(s): DDIMER in the last 72 hours. Hemoglobin A1C No results for input(s): HGBA1C in the last 72 hours. Fasting Lipid Panel No results for input(s): CHOL, HDL, LDLCALC, TRIG, CHOLHDL, LDLDIRECT in the last 72 hours. Thyroid Function Tests No results for input(s): TSH, T4TOTAL, T3FREE, THYROIDAB in the last 72 hours.  Invalid input(s): FREET3  TELE  NSR  Radiology/Studies  Dg Chest Port 1 View  05/27/2014   CLINICAL DATA:  Chest pain radiating up into jaw and down into left arm for 2 days  EXAM: PORTABLE CHEST - 1 VIEW  COMPARISON:  None.  FINDINGS: The heart size and mediastinal  contours are within normal limits. Both lungs are clear. The visualized skeletal structures are unremarkable.  IMPRESSION: No active disease.   Electronically Signed   By: Skipper Cliche M.D.   On: 05/27/2014 19:49    ASSESSMENT AND PLAN  1. USA/NSTEMI - she is pain free. Will plan left heart cath today. She will continue her current meds. I discussed the procedure in details.  Echocardiogram: Normal LV function, no WMA, EF >40% Diastolic relaxation ABN noted No significant valve pathology Normal RVSP  2. Dyslipidemia - her cholesterol and triglycerides are very high. Will start statin therapy.   3. Obesity - weight loss has been discussed. She is limited in her activity by arthritis.   Kathlyn Sacramento, MD  05/30/2014 8:20 AM

## 2014-05-30 NOTE — Care Management (Signed)
Patient has been started on Brilinta.  Provided patient with 30 day coupon.  Patient receives assistance from [facility].  Daughter relay that patient does have medicare d coverage and receives assistance from [employer], BSN, RN, [place] [phone]

## 2014-05-30 NOTE — Progress Notes (Signed)
Report called to Maddie.  Keep wrist elevated on a pillow this evening.Marland KitchenMarland KitchenNo  Activity with right hand this evening and no lifting

## 2014-05-30 NOTE — Progress Notes (Signed)
Karen Cervantes, is a 59 y.o. female, DOB - 1955/06/13, EML:[id number]  Admit date - 05/27/2014   Admitting Physician Fritzi Mandes, MD  Outpatient Primary MD for the patient is No primary care provider on file.  LOS - 3   No chief complaint on file.       Subjective:   Karen Cervantes went for cardiac cath today.   Procedures cardiac cath   Consults  cardiology   Medications  Scheduled Meds: . aspirin      . [MAR Hold] atenolol  50 mg Oral Daily  . bivalirudin      . [MAR Hold] enoxaparin (LOVENOX) injection  90 mg Subcutaneous Q12H  . fentaNYL      . [MAR Hold] gabapentin  600 mg Oral BID  . heparin      . [MAR Hold] insulin aspart  0-10 Units Subcutaneous TID AC & HS  . [MAR Hold] leflunomide  10 mg Oral Daily  . lidocaine (PF)      . metoprolol      . midazolam      . [MAR Hold] multivitamin with minerals  1 tablet Oral Daily  . [MAR Hold] nitroGLYCERIN  0.1 mg Transdermal Q0600  . nitroGLYCERIN      . [MAR Hold] pantoprazole  40 mg Oral Daily  . [MAR Hold] simvastatin  40 mg Oral QHS  . [MAR Hold] sucralfate  1 g Oral TID AC & HS  . ticagrelor      . verapamil       Continuous Infusions: . sodium chloride    . bivalirudin (ANGIOMAX) infusion 5 mg/mL (Cath Lab,ACS,PCI indication) Stopped (05/30/14 1306)   PRN Meds:.sodium chloride, [MAR Hold] acetaminophen, aspirin, bivalirudin (ANGIOMAX) infusion 5 mg/mL (Cath Lab,ACS,PCI indication), bivalirudin, [MAR Hold] cyclobenzaprine, [MAR Hold] docusate sodium, fentaNYL, [MAR Hold] HYDROcodone-acetaminophen, iohexol, metoprolol, midazolam, nitroGLYCERIN, [MAR Hold] ondansetron (ZOFRAN) IV, [MAR Hold] senna, [MAR Hold] sodium chloride, ticagrelor, verapamil, verapamil  DVT Prophylaxis  Lovenox -  Lab Results  Component Value Date   PLT 467*  05/27/2014    Antibiotics    Anti-infectives    None          Objective:   Filed Vitals:   05/29/14 1145 05/29/14 1943 05/30/14 0551 05/30/14 0927  BP: 125/72 131/73 147/64 148/81  Pulse: 73 77 98 103  Temp: 98.3 F (36.8 C) 98.6 F (37 C) 98.4 F (36.9 C) 98.5 F (36.9 C)  TempSrc: Oral Oral Oral Oral  Resp: 19 18 18 18   Height:      Weight:   88.633 kg (195 lb 6.4 oz)   SpO2: 96% 96% 97%     Wt Readings from Last 3 Encounters:  05/30/14 88.633 kg (195 lb 6.4 oz)     Intake/Output Summary (Last 24 hours) at 05/30/14 1314 Last data filed at 05/29/14 1725  Gross per 24 hour  Intake      0 ml  Output      0 ml  Net      0 ml     Physical Exam  Awake Alert, Oriented X 3, No new F.N deficits, Normal affect [place].AT,PERRAL Supple Neck,No JVD, No cervical lymphadenopathy appriciated.  Symmetrical Chest wall movement, Good air movement bilaterally, CTAB RRR,No Gallops,Rubs or new Murmurs, No  Parasternal Heave +ve B.Sounds, Abd Soft, No tenderness, No organomegaly appriciated, No rebound - guarding or rigidity. No Cyanosis, Clubbing or edema, No new Rash or bruise     Data Review   Micro Results No results found for this or any previous visit (from the past 240 hour(s)).  Radiology Reports Dg Chest Port 1 View  05/27/2014   CLINICAL DATA:  Chest pain radiating up into jaw and down into left arm for 2 days  EXAM: PORTABLE CHEST - 1 VIEW  COMPARISON:  None.  FINDINGS: The heart size and mediastinal contours are within normal limits. Both lungs are clear. The visualized skeletal structures are unremarkable.  IMPRESSION: No active disease.   Electronically Signed   By: Skipper Cliche M.D.   On: 05/27/2014 19:49     CBC  Recent Labs Lab 05/27/14 1717  WBC 17.3*  HGB 13.4  HCT 41.6  PLT 467*  MCV 89  MCH 28.5  MCHC 32.2  RDW 13.5    Chemistries   Recent Labs Lab 05/27/14 1717  NA 137  K 4.9  CL 97*  CO2 31  GLUCOSE 243*  BUN 16  CREATININE  0.79  CALCIUM 9.5   ------------------------------------------------------------------------------------------------------------------ estimated creatinine clearance is 81 mL/min (by C-G formula based on Cr of 0.79). ------------------------------------------------------------------------------------------------------------------ No results for input(s): HGBA1C in the last 72 hours. ------------------------------------------------------------------------------------------------------------------ No results for input(s): CHOL, HDL, LDLCALC, TRIG, CHOLHDL, LDLDIRECT in the last 72 hours. ------------------------------------------------------------------------------------------------------------------ No results for input(s): TSH, T4TOTAL, T3FREE, THYROIDAB in the last 72 hours.  Invalid input(s): FREET3 ------------------------------------------------------------------------------------------------------------------ No results for input(s): VITAMINB12, FOLATE, FERRITIN, TIBC, IRON, RETICCTPCT in the last 72 hours.  Coagulation profile No results for input(s): INR, PROTIME in the last 168 hours.  No results for input(s): DDIMER in the last 72 hours.  Cardiac Enzymes No results for input(s): CKMB, TROPONINI, MYOGLOBIN in the last 168 hours.  Invalid input(s): CK ------------------------------------------------------------------------------------------------------------------ Invalid input(s): POCBNP   Assessment & Plan    Problem list:  Patient Active Problem List   Diagnosis Date Noted  . Acute coronary syndrome 05/28/2014    Active Problems:   Acute coronary syndrome  -59 year-old female patient with non-Q-wave MI: Continue aspirin, beta blockers, statins, nitrates, full dose Lovenox. Continue monitoring on telemetry check echocardiogram results dispo;pending cath results Rheumatoid arthritis rheumatoid arthritis: Continue her home medication mainly leflunomide, prednisone,  methotrexate. Leg cramps. Follow up with her rheumatologist. Conservative management. Code Status:full  Family Communication: d/w daughter  Disposition Plan:home     Time Spent in minutes  33 min   Donne Robillard M.D on 05/30/2014 at 1:14 PM

## 2014-05-30 NOTE — Progress Notes (Signed)
Stent card given to patients daughter 

## 2014-05-31 ENCOUNTER — Telehealth: Payer: Self-pay

## 2014-05-31 ENCOUNTER — Other Ambulatory Visit: Payer: Self-pay | Admitting: Physician Assistant

## 2014-05-31 ENCOUNTER — Encounter: Payer: Self-pay | Admitting: Physician Assistant

## 2014-05-31 DIAGNOSIS — I200 Unstable angina: Secondary | ICD-10-CM

## 2014-05-31 DIAGNOSIS — I2510 Atherosclerotic heart disease of native coronary artery without angina pectoris: Secondary | ICD-10-CM | POA: Insufficient documentation

## 2014-05-31 DIAGNOSIS — M0579 Rheumatoid arthritis with rheumatoid factor of multiple sites without organ or systems involvement: Secondary | ICD-10-CM | POA: Insufficient documentation

## 2014-05-31 DIAGNOSIS — I5189 Other ill-defined heart diseases: Secondary | ICD-10-CM | POA: Insufficient documentation

## 2014-05-31 DIAGNOSIS — E785 Hyperlipidemia, unspecified: Secondary | ICD-10-CM | POA: Insufficient documentation

## 2014-05-31 LAB — CBC
HEMATOCRIT: 35.8 % (ref 35.0–47.0)
Hemoglobin: 11.5 g/dL — ABNORMAL LOW (ref 12.0–16.0)
MCH: 28.5 pg (ref 26.0–34.0)
MCHC: 32.2 g/dL (ref 32.0–36.0)
MCV: 88.5 fL (ref 80.0–100.0)
Platelets: 294 10*3/uL (ref 150–440)
RBC: 4.04 MIL/uL (ref 3.80–5.20)
RDW: 13.9 % (ref 11.5–14.5)
WBC: 12.9 10*3/uL — ABNORMAL HIGH (ref 3.6–11.0)

## 2014-05-31 LAB — BASIC METABOLIC PANEL
Anion gap: 7 (ref 5–15)
BUN: 11 mg/dL (ref 6–20)
CHLORIDE: 105 mmol/L (ref 101–111)
CO2: 26 mmol/L (ref 22–32)
Calcium: 8.6 mg/dL — ABNORMAL LOW (ref 8.9–10.3)
Creatinine, Ser: 0.54 mg/dL (ref 0.44–1.00)
GFR calc Af Amer: >60 mL/min (ref >60)
GFR calc non Af Amer: >60 mL/min (ref >60)
Glucose, Bld: 109 mg/dL — ABNORMAL HIGH (ref 65–99)
POTASSIUM: 4 mmol/L (ref 3.5–5.1)
Sodium: 138 mmol/L (ref 135–145)

## 2014-05-31 LAB — GLUCOSE, CAPILLARY
Glucose-Capillary: 102 mg/dL — ABNORMAL HIGH (ref 70–99)
Glucose-Capillary: 153 mg/dL — ABNORMAL HIGH (ref 70–99)

## 2014-05-31 MED ORDER — LISINOPRIL 5 MG PO TABS
5.0000 mg | ORAL_TABLET | Freq: Every day | ORAL | Status: DC
  Administered 2014-05-31: 5 mg via ORAL
  Filled 2014-05-31: qty 1

## 2014-05-31 MED ORDER — LISINOPRIL 5 MG PO TABS: 5.0000 mg | ORAL_TABLET | Freq: Every day | ORAL | Status: DC

## 2014-05-31 MED ORDER — SIMVASTATIN 40 MG PO TABS: 40.0000 mg | ORAL_TABLET | Freq: Every day | ORAL | Status: DC

## 2014-05-31 MED ORDER — ASPIRIN EC 81 MG PO TBEC: 81.0000 mg | DELAYED_RELEASE_TABLET | Freq: Every day | ORAL | Status: AC

## 2014-05-31 MED ORDER — ASPIRIN EC 81 MG PO TBEC
81.0000 mg | DELAYED_RELEASE_TABLET | ORAL | Status: AC
  Administered 2014-05-31: 81 mg via ORAL
  Filled 2014-05-31: qty 1

## 2014-05-31 MED ORDER — SUCRALFATE 1 G PO TABS: 1.0000 g | ORAL_TABLET | Freq: Three times a day (TID) | ORAL | Status: DC

## 2014-05-31 MED ORDER — TICAGRELOR 90 MG PO TABS: 90.0000 mg | ORAL_TABLET | Freq: Two times a day (BID) | ORAL | Status: DC

## 2014-05-31 MED ORDER — FENOFIBRATE 48 MG PO TABS: 48.0000 mg | ORAL_TABLET | Freq: Every day | ORAL | Status: DC

## 2014-05-31 NOTE — Progress Notes (Signed)
Pt is alert and oriented. Pt has ambulated to bathroom during shift. Pt gait is back to baseline. Pt right groin and right radial dressing is clean, dry and intact. No other signs of distress noted. Will continue to monitor.

## 2014-05-31 NOTE — Discharge Instructions (Signed)
Follow up with DR.Gollan in one month

## 2014-05-31 NOTE — Telephone Encounter (Signed)
Medication is on the AVS to be continued. I wrote for this in the hospital. They do not need to do anything.

## 2014-05-31 NOTE — Plan of Care (Signed)
Problem: Food- and Nutrition-Related Knowledge Deficit (NB-1.1) Goal: Nutrition education Formal process to instruct or train a patient/client in a skill or to impart knowledge to help patients/clients voluntarily manage or modify food choices and eating behavior to maintain or improve health. Outcome: Completed/Met Date Met:  05/31/14 Nutrition Education Note  RD consulted for nutrition education regarding a Heart Healthy diet.   Lipid Panel  Triglycerides: 658, Cholesterol- 324  RD provided "Heart Healthy Nutrition Therapy" handout from the Academy of [employer]etics. Reviewed patient's dietary recall. Provided examples on ways to decrease sodium and fat intake in diet. Discouraged intake of processed foods and use of salt shaker. Encouraged fresh fruits and vegetables as well as whole grain sources of carbohydrates to maximize fiber intake. Teach back method used.  Expect good compliance.  Body mass index is 30.76 kg/(m^2).   Current diet order is clear liquid, patient is consuming approximately 100% of meals at this time. Labs and medications reviewed. No further nutrition interventions warranted at this time. RD contact information provided. If additional nutrition issues arise, please re-consult RD.  Roda Shutters, RDN [phone]

## 2014-05-31 NOTE — Progress Notes (Signed)
Patient: Karen Cervantes / Admit Date: 05/27/2014 / Date of Encounter: 05/31/2014, 8:35 AM   Subjective: Feels well. No chest pain or SOB. Has ambulated without any symptoms. Status post cardiac cath Ost LAD lesion, 30% stenosed, Ost RCA lesion, 60% stenosed, Prox RCA lesion, 50% stenosed, Dist RCA lesion, 60% stenosed, Mid LAD lesion, 95% stenosed s/p PCI/DES. EF >55% by echo.   Review of Systems: Review of Systems  Constitutional: Negative for fever, chills, weight loss, malaise/fatigue and diaphoresis.  Eyes: Negative for blurred vision and double vision.  Respiratory: Negative for cough, shortness of breath and wheezing.   Cardiovascular: Negative for chest pain, palpitations, orthopnea, claudication, leg swelling and PND.  Gastrointestinal: Negative for heartburn and nausea.  Skin: Negative for rash.  Neurological: Negative for weakness.    Objective: Telemetry: NSR, 70s Physical Exam: Blood pressure 154/52, pulse 77, temperature 98.4 F (36.9 C), temperature source Oral, resp. rate 18, height 5\' 3"  (1.6 m), weight 173 lb 9.6 oz (78.744 kg), SpO2 99 %. Body mass index is 30.76 kg/(m^2). General: Well developed, well nourished, in no acute distress. Head: Normocephalic, atraumatic, sclera non-icteric, no xanthomas, nares are without discharge. Neck: Negative for carotid bruits. JVP not elevated. Lungs: Clear bilaterally to auscultation without wheezes, rales, or rhonchi. Breathing is unlabored. Heart: RRR S1 S2 without murmurs, rubs, or gallops.  Abdomen: Soft, non-tender, non-distended with normoactive bowel sounds. No rebound/guarding. Extremities: No clubbing or cyanosis. No edema. Distal pedal pulses are 2+ and equal bilaterally. Neuro: Alert and oriented X 3. Moves all extremities spontaneously. Psych:  Responds to questions appropriately with a normal affect.   Intake/Output Summary (Last 24 hours) at 05/31/14 0835 Last data filed at 05/31/14 0235  Gross per 24 hour    Intake    750 ml  Output    600 ml  Net    150 ml    Inpatient Medications:  . aspirin EC  81 mg Oral STAT  . atenolol  50 mg Oral Daily  . gabapentin  600 mg Oral BID  . heparin  5,000 Units Subcutaneous 3 times per day  . insulin aspart  0-10 Units Subcutaneous TID AC & HS  . leflunomide  10 mg Oral Daily  . lisinopril  5 mg Oral Daily  . multivitamin with minerals  1 tablet Oral Daily  . nitroGLYCERIN  0.1 mg Transdermal Q0600  . pantoprazole  40 mg Oral Daily  . simvastatin  40 mg Oral QHS  . sodium chloride  3 mL Intravenous Q12H  . sucralfate  1 g Oral TID AC & HS  . ticagrelor  90 mg Oral BID      Infusions:    Labs:  Recent Labs  05/31/14 0431  NA 138  K 4.0  CL 105  CO2 26  GLUCOSE 109*  BUN 11  CREATININE 0.54  CALCIUM 8.6*   No results for input(s): AST, ALT, ALKPHOS, BILITOT, PROT, ALBUMIN in the last 72 hours.  Recent Labs  05/31/14 0431  WBC 12.9*  HGB 11.5*  HCT 35.8  MCV 88.5  PLT 294   No results for input(s): CKTOTAL, CKMB, TROPONINI in the last 72 hours. Invalid input(s): POCBNP No results for input(s): HGBA1C in the last 72 hours.   Weights: Filed Weights   05/29/14 0457 05/30/14 0551 05/31/14 0445  Weight: 197 lb 6.4 oz (89.54 kg) 195 lb 6.4 oz (88.633 kg) 173 lb 9.6 oz (78.744 kg)     Radiology/Studies:  Dg Chest [facility]  1 View  05/27/2014   CLINICAL DATA:  Chest pain radiating up into jaw and down into left arm for 2 days  EXAM: PORTABLE CHEST - 1 VIEW  COMPARISON:  None.  FINDINGS: The heart size and mediastinal contours are within normal limits. Both lungs are clear. The visualized skeletal structures are unremarkable.  IMPRESSION: No active disease.   Electronically Signed   By: Skipper Cliche M.D.   On: 05/27/2014 19:49     Assessment and Plan  1. [place]/NSTEMI: -Status post cardiac cath as above, PCI/DEs to Mid LAD lesion, 95% stenosed s/p PCI/DES. Residual 60% stenosis along dRCA. Plan for nuclear stress test in 4-6 weeks  to evaluate for high risk ischemia. EF >55% by echo.  -She has remained chest pain free -Ambulated without difficulty  -Continue DAPT for at least 12 months (Brilinta 90 mg bid and aspirin 81 mg daily) -Continue atenolol 50 mg daily and simvastatin 40 mg qhs -Cardiac rehab -Nutritionist  -Patient's daughter requests a letter for home health CNA given the NSTEMI and patient's underlying RA (daughter lives at college and is unable to help)  2. Dyslipidemia: -Add Tricor -Continue simvastatin   3. Obesity: -Weight loss has been discussed -She is limited in her activity by arthritis. -Per #2   Signed, Christell Faith, PA-C   Attending note: Patient seen and examined. Agree with above. Doing well this AM. Discussed obtaining her Brilinta with her from the pharmacy.  She has a coupon for free month.  continue b-blocker, asa, brilinta, statin, ace Dr. Fletcher Anon to see her in clinic in follow up.   Esmond Plants   M.D.

## 2014-05-31 NOTE — Telephone Encounter (Signed)
[facility] requesting a refill but unclear if Lisinopril is being discontinue or not. Looking at the discharge summary, not on the medication list. Please advise.

## 2014-05-31 NOTE — Telephone Encounter (Signed)
LMOM to call regarding Brilinta samples.

## 2014-05-31 NOTE — Progress Notes (Signed)
Pt. Discharged to home.  Patient assessment unchanged from this morning. Discharge instructions and medication regimen reviewed at bedside with patient and daughter. Pt. And daughter verbalizes understanding of instructions and medication regimen. Prescriptions given to patient to take home. Pt. Will be in touch with Dr. Donivan Scull office about obtaining Brillinta samples, pt has the appropriate phone number and Dr. Gabriel Carina aware they will be expecting a call. TELE and IV discontinued per policy.  Pt. Taken to main entrance via wheelchair and Youth worker, Careers adviser.

## 2014-05-31 NOTE — Discharge Summary (Signed)
Karen Cervantes, is a 59 y.o. female  DOB 1955-06-18  MRN [id number].  Admission date:  05/27/2014  Admitting Physician  Karen Mandes, MD  Discharge Date:  05/31/2014   Primary MD  [facility]  Recommendations for primary care physician for things to follow:     Admission Diagnosis  ACUTE CORONARY SYNDROME   Discharge Diagnosis  ACUTE CORONARY SYNDROME   Hyperlipidemia  Rheumatoid arthritis   Active Problems:   Acute coronary syndrome      No past medical history on file.  No past surgical history on file.     History of present illness and  Hospital Course:     Karen Cervantes is a very pleasant, 59 year old, Caucasian female with past medical history of hypertension and rheumatoid arthritis, who comes to the Emergency Room after she started having feelings of chest soreness and heaviness across the chest and radiated to her jaw .admitted to [facility] for NSTEMI. NQMI;troponin . Peaked from 0.02 to 0.81/started on asa,bblcoker,nitrates,full dose Lovenox..high intensity statins.seen by cardiology ,DR.Arida,and taken to cardiac cath on 05/30/14.HAd DEs in Mid  LAD.  RCA lesion, 60% stenosed.  Prox RCA lesion, 50% stenosed.  Dist RCA lesion, 60% stenosed.  Mid LAD lesion, 95% stenosed. There is a 0% residual stenosis post intervention.  A drug-eluting stent was placed.  1. Severe 1 vessel CAD involving LAD (culprit) and borderline significant disease in RCA.  2. Normal EF by echo.  3. Successful PCI and DES placement to mid LAD.   Recommendations: 1. Dual antiplatelet therapy for at least 1 year.  2. Nuclear stress test in 4-6 weeks to evaluate for possible ischemia in RCA distribution.  Severe Hyperlipidemia.hypertryglyceridemia; TG 658,started on statins.    Hospital Course ;uncomplicated,chest pain free since  admission.course as above   Discharge Condition:stable    Follow UP;with [facility];cardiology  In one month      Discharge Instructions  and  Discharge Medications  Continue low sodium,low fat diet.     Medication List    ASK your doctor about these medications        atenolol 50 MG tablet  Commonly known as:  TENORMIN  Take 50 mg by mouth every morning.     cyclobenzaprine 10 MG tablet  Commonly known as:  FLEXERIL  Take 10 mg by mouth 2 (two) times daily.     FOLIC ACID PO  Take 1 tablet by mouth every morning.     gabapentin 300 MG capsule  Commonly known as:  NEURONTIN  Take 600 mg by mouth  2 (two) times daily.     HYDROcodone-acetaminophen 5-325 MG per tablet  Commonly known as:  NORCO/VICODIN  Take 1 tablet by mouth every 4 (four) hours as needed for moderate pain.     leflunomide 10 MG tablet  Commonly known as:  ARAVA  Take 20 mg by mouth every morning.     METHOTREXATE (PF) [place]  Inject 0.4 mLs into the skin once a week. Once a week     MULTIVITAMIN PO  Take 1 tablet by mouth every morning.     omeprazole 20 MG capsule  Commonly known as:  PRILOSEC  Take 20 mg by mouth every morning.     predniSONE 5 MG tablet  Commonly known as:  DELTASONE  Take 10 mg by mouth every morning.     Tofacitinib Citrate 5 MG Tabs  Take 1 tablet by mouth 2 (two) times daily.          Diet and Activity recommendation: See Discharge Instructions above   Consults obtained - cardiology   Major procedures and Radiology Reports - PLEASE review detailed and final reports for all details, in brief -    Dg Chest Port 1 View  05/27/2014   CLINICAL DATA:  Chest pain radiating up into jaw and down into left arm for 2 days  EXAM: PORTABLE CHEST - 1 VIEW  COMPARISON:  None.  FINDINGS: The heart size and mediastinal contours are within normal limits. Both lungs are clear. The visualized skeletal structures are unremarkable.  IMPRESSION: No active disease.   Electronically  Signed   By: Skipper Cliche M.D.   On: 05/27/2014 19:49    Micro Results   No results found for this or any previous visit (from the past 240 hour(s)).     Today   Subjective:   Karen Cervantes today has no headache,no chest abdominal pain,no new weakness tingling or numbness, feels much better wants to go home today.  Objective:   Blood pressure 154/52, pulse 77, temperature 98.4 F (36.9 C), temperature source Oral, resp. rate 18, height 5\' 3"  (1.6 m), weight 78.744 kg (173 lb 9.6 oz), SpO2 99 %.   Intake/Output Summary (Last 24 hours) at 05/31/14 0743 Last data filed at 05/31/14 0235  Gross per 24 hour  Intake    750 ml  Output    600 ml  Net    150 ml    Exam Awake Alert, Oriented x 3, No new F.N deficits, Normal affect [place].AT,PERRAL Supple Neck,No JVD, No cervical lymphadenopathy appriciated.  Symmetrical Chest wall movement, Good air movement bilaterally, CTAB RRR,No Gallops,Rubs or new Murmurs, No Parasternal Heave +ve B.Sounds, Abd Soft, Non tender, No organomegaly appriciated, No rebound -guarding or rigidity. No Cyanosis, Clubbing or edema, No new Rash or bruise  Data Review   CBC w Diff: Lab Results  Component Value Date   WBC 12.9* 05/31/2014   WBC 17.3* 05/27/2014   HGB 11.5* 05/31/2014   HGB 13.4 05/27/2014   HCT 35.8 05/31/2014   HCT 41.6 05/27/2014   PLT 294 05/31/2014   PLT 467* 05/27/2014    CMP: Lab Results  Component Value Date   NA 138 05/31/2014   NA 137 05/27/2014   K 4.0 05/31/2014   K 4.9 05/27/2014   CL 105 05/31/2014   CL 97* 05/27/2014   CO2 26 05/31/2014   CO2 31 05/27/2014   BUN 11 05/31/2014   BUN 16 05/27/2014   CREATININE 0.54 05/31/2014   CREATININE 0.79 05/27/2014  PROT 7.9 08/29/2012   ALBUMIN 3.8 08/29/2012   ALKPHOS 83 08/29/2012   AST 29 08/29/2012   ALT 41 08/29/2012  .   Total Time in preparing paper work, data evaluation and todays exam - 38 minutes  Karen Cervantes M.D on 05/31/2014 at 7:43  AM

## 2014-05-31 NOTE — Care Management (Signed)
Late entry due to EPIC implementation   Met with patient and her daughter approx 8:45 A and 10:40 A.  Dr Rockey Situ was present during the first encounter when discussed Brilinta and that patient should not go without.  Patient allows during this interactions that she does not have medicare D coverage but gets her meds filled at [facility]. Dr Rockey Situ informed patient she could contact office to obtain more samples.  Patient' daughter will do this today. Provided patient with the patient assistance application for the Brillinta and patient's daughter will take the application this day to [facility] and obtain assist with completion.  There is a Probation officer person present requesting face sheet information and order for home health physical therapy.  Also says she was going to put aide service in the home but patient does not have a payor that would cover this.   Discussed that patient did not have PT consult during stay and no recommendations that she needs home health.  Discussed that if  Attending feels patient  would benefit from outpatient physical therapy services, the order would be placed in  EPIC.  Updated primary nurse.  Patient and daughter verbalize understanding

## 2014-05-31 NOTE — Telephone Encounter (Signed)
Pt would like Brilinta samples. Pt is being D/C from [facility] today

## 2014-06-01 NOTE — Telephone Encounter (Signed)
Placed samples of Brilinta 90 mg at front desk for pick up.

## 2014-06-01 NOTE — Telephone Encounter (Signed)
-----   Message from Rise Mu, PA-C sent at 05/31/2014  8:48 AM EDT ----- Marykay Lex, this patient needs a Lexiscan in 4-6 weeks. Post PCI. She also needs cardiac rehab. NSTEMI.   Thanks!

## 2014-06-01 NOTE — Telephone Encounter (Signed)
Spoke w/ pt.  Advised her of Ryan's recommendation.  She is sched to see Dr. Fletcher Anon on 06/10/14, to advised that we can sched lexi at that time so that we can go over instructions and try to get a date & time that are convenient for pt.  Advised her that we will send info to cardiac rehab at that time, as well, as we do not have clinic notes to send at this time. She is agreeable and will keep her appt next week.

## 2014-06-03 ENCOUNTER — Encounter: Payer: Self-pay | Admitting: Cardiovascular Disease

## 2014-06-03 LAB — GLUCOSE, CAPILLARY
GLUCOSE-CAPILLARY: 141 mg/dL — AB (ref 70–99)
Glucose-Capillary: 162 mg/dL — ABNORMAL HIGH (ref 70–99)

## 2014-06-10 ENCOUNTER — Encounter: Payer: Self-pay | Admitting: Cardiovascular Disease

## 2014-06-10 ENCOUNTER — Ambulatory Visit (INDEPENDENT_AMBULATORY_CARE_PROVIDER_SITE_OTHER): Payer: Medicare Other | Admitting: Cardiovascular Disease

## 2014-06-10 VITALS — BP 148/80 | HR 73 | Ht 63.0 in | Wt 197.5 lb

## 2014-06-10 DIAGNOSIS — I10 Essential (primary) hypertension: Secondary | ICD-10-CM

## 2014-06-10 DIAGNOSIS — I25118 Atherosclerotic heart disease of native coronary artery with other forms of angina pectoris: Secondary | ICD-10-CM | POA: Diagnosis not present

## 2014-06-10 DIAGNOSIS — E785 Hyperlipidemia, unspecified: Secondary | ICD-10-CM | POA: Diagnosis not present

## 2014-06-10 DIAGNOSIS — R0602 Shortness of breath: Secondary | ICD-10-CM

## 2014-06-10 DIAGNOSIS — R079 Chest pain, unspecified: Secondary | ICD-10-CM

## 2014-06-10 NOTE — Assessment & Plan Note (Signed)
Blood pressure is mildly elevated. I will consider increasing lisinopril or switching atenolol to carvedilol.

## 2014-06-10 NOTE — Assessment & Plan Note (Signed)
Continue treatment with simvastatin and fenofibrate. I will plan on repeat lipid and liver profile in 4 weeks.

## 2014-06-10 NOTE — Progress Notes (Signed)
HPI  This is a pleasant 59 year old female who is here today for a follow-up visit after recent hospitalization for non-ST elevation myocardial infarction. She has known history of rheumatoid arthritis and hypertension. She presented with substernal chest and jaw pain. ECG was unremarkable but troponin was elevated. Cardiac catheterization was attempted via the right radial artery but could not be completed due to stenosis noted in the radial artery. Was successfully done via the right femoral artery and showed severe 95% mid LAD stenosis and 60% stenosis and ostial and distal RCA. Ejection fraction was normal. She underwent successful angioplasty and drug-eluting stent placement to the mid LAD without complications. She reports no further symptoms since then. She has been taking her medications regularly. She is on Brilinta and reports dyspnea when she takes the medication which has been mild overall and has been improving.  she was found to have severely elevated triglyceride and was started on simvastatin and fenofibrate.  Allergies  Allergen Reactions  . Remicade [Infliximab] Hives and Hypertension     Current Outpatient Prescriptions on File Prior to Visit  Medication Sig Dispense Refill  . aspirin EC 81 MG tablet Take 1 tablet (81 mg total) by mouth daily. 30 tablet 0  . atenolol (TENORMIN) 50 MG tablet Take 50 mg by mouth every morning.     . fenofibrate (TRICOR) 48 MG tablet Take 1 tablet (48 mg total) by mouth daily. 30 tablet 0  . FOLIC ACID PO Take 1 tablet by mouth every morning.     . gabapentin (NEURONTIN) 300 MG capsule Take 600 mg by mouth 2 (two) times daily.     Marland Kitchen HYDROcodone-acetaminophen (NORCO/VICODIN) 5-325 MG per tablet Take 1 tablet by mouth every 4 (four) hours as needed for moderate pain.    Marland Kitchen lisinopril (PRINIVIL,ZESTRIL) 5 MG tablet Take 1 tablet (5 mg total) by mouth daily. 30 tablet 5  . Multiple Vitamins-Minerals (MULTIVITAMIN PO) Take 1 tablet by mouth every  morning.    Marland Kitchen omeprazole (PRILOSEC) 20 MG capsule Take 20 mg by mouth every morning.     . simvastatin (ZOCOR) 40 MG tablet Take 1 tablet (40 mg total) by mouth at bedtime. 30 tablet 0  . sucralfate (CARAFATE) 1 G tablet Take 1 tablet (1 g total) by mouth 4 (four) times daily -  before meals and at bedtime. 60 tablet 0  . ticagrelor (BRILINTA) 90 MG TABS tablet Take 1 tablet (90 mg total) by mouth 2 (two) times daily. 60 tablet 0  . Methotrexate, Anti-Rheumatic, (METHOTREXATE, PF, [place]) Inject 0.4 mLs into the skin once a week. Once a week    . Tofacitinib Citrate 5 MG TABS Take 1 tablet by mouth 2 (two) times daily.     No current facility-administered medications on file prior to visit.     Past Medical History  Diagnosis Date  . CAD (coronary artery disease)     a. NSTEMI 04/2014; b. cardiac cath 05/30/2014: ost LAD 30%, mLAD 95% s/p PCI/DES, ost RCA 60%, pRCA 50%, dRCA 60%, EF >55% by echo  . HLD (hyperlipidemia)   . Obesity   . RA (rheumatoid arthritis)   . Diastolic dysfunction     a. echo 04/2014: EF >20%, no WMA, diastolic dysfunction, no valvular abnormalities, normal RVSP  . Hypertension      Past Surgical History  Procedure Laterality Date  . Cardiac catheterization N/A 05/30/2014    Procedure: Left Heart Cath and Coronary Angiography;  Surgeon: Wellington Hampshire, MD;  Location:  [facility];  Service: Cardiovascular;  Laterality: N/A;  . Cardiac catheterization N/A 05/30/2014    Procedure: Coronary Stent Intervention;  Surgeon: Wellington Hampshire, MD;  Location: [facility];  Service: Cardiovascular;  Laterality: N/A;     Family History  Problem Relation Age of Onset  . Heart attack Father   . Heart disease Father   . Heart Problems Brother   . Heart Problems Brother   . Heart Problems Brother      History   Social History  . Marital Status: Married    Spouse Name: N/A  . Number of Children: N/A  . Years of Education: N/A   Occupational History  .  Not on file.   Social History Main Topics  . Smoking status: Never Smoker   . Smokeless tobacco: Not on file  . Alcohol Use: Yes  . Drug Use: No  . Sexual Activity: Not on file   Other Topics Concern  . Not on file   Social History Narrative     PHYSICAL EXAM   BP 148/80 mmHg  Pulse 73  Ht 5\' 3"  (1.6 m)  Wt 197 lb 8 oz (89.585 kg)  BMI 34.99 kg/m2 Constitutional: She is oriented to person, place, and time. She appears well-developed and well-nourished. No distress.  HENT: No nasal discharge.  Head: Normocephalic and atraumatic.  Eyes: Pupils are equal and round. No discharge.  Neck: Normal range of motion. Neck supple. No JVD present. No thyromegaly present.  Cardiovascular: Normal rate, regular rhythm, normal heart sounds. Exam reveals no gallop and no friction rub. No murmur heard.  Pulmonary/Chest: Effort normal and breath sounds normal. No stridor. No respiratory distress. She has no wheezes. She has no rales. She exhibits no tenderness.  Abdominal: Soft. Bowel sounds are normal. She exhibits no distension. There is no tenderness. There is no rebound and no guarding.  Musculoskeletal: Normal range of motion. She exhibits no edema and no tenderness.  Neurological: She is alert and oriented to person, place, and time. Coordination normal.  Skin: Skin is warm and dry. No rash noted. She is not diaphoretic. No erythema. No pallor.  Psychiatric: She has a normal mood and affect. Her behavior is normal. Judgment and thought content normal.  Right radial pulse is normal with no hematoma. Right femoral pulse is normal with no hematoma.   OXB:DZHGD  Rhythm  Low voltage in precordial leads.   ABNORMAL     ASSESSMENT AND PLAN

## 2014-06-10 NOTE — Assessment & Plan Note (Signed)
She is doing well overall after recent angioplasty and drug-eluting stent placement to the mid LAD. She still has residual borderline significant stenosis in the RCA in 2 different locations. Her current symptoms include mild exertional dyspnea without chest pain. I recommend a treadmill nuclear stress test in 2-3 weeks to evaluate for ischemia in the RCA distribution. If stress test comes back low risk, she can proceed with cardiac rehabilitation. Continue dual antiplatelet therapy for now on aggressive treatment of risk factors.

## 2014-06-10 NOTE — Patient Instructions (Addendum)
Medication Instructions: - no changes  Labwork: - Your physician recommends that you return for a FASTING lipid/liver profile: 1 month (same day as follow up with Dr. Fletcher Anon)  Procedures/Testing: - Your physician has requested that you have an exercise stress myoview- in 2-3 weeks.   Follow-Up: - Your physician recommends that you schedule a follow-up appointment in: 1 month with Dr. Fletcher Anon.  Any Additional Special Instructions Will Be Listed Below (If Applicable).  [place]  Your caregiver has ordered a Stress Test with nuclear imaging. The purpose of this test is to evaluate the blood supply to your heart muscle. This procedure is referred to as a "Non-Invasive Stress Test." This is because other than having an IV started in your vein, nothing is inserted or "invades" your body. Cardiac stress tests are done to find areas of poor blood flow to the heart by determining the extent of coronary artery disease (CAD). Some patients exercise on a treadmill, which naturally increases the blood flow to your heart, while others who are  unable to walk on a treadmill due to physical limitations have a pharmacologic/chemical stress agent called Lexiscan . This medicine will mimic walking on a treadmill by temporarily increasing your coronary blood flow.   Please note: these test may take anywhere between 2-4 hours to complete  PLEASE REPORT TO [facility] [place] AT THE FIRST DESK WILL DIRECT YOU WHERE TO GO  Date of Procedure:__________Tuesday 5/31/16___________________________  Arrival Time for Procedure:_________7:45 am_____________________  Instructions regarding medication:   ____ : Hold diabetes medication morning of procedure  _x___:  Hold betablocker(s) night before procedure and morning of procedure (atenolol)  ____:  Hold other medications as  follows:_________________________________________________________________________________________________________________________________________________________________________________________________________________________________________________________________________________________  PLEASE NOTIFY THE OFFICE AT LEAST 24 HOURS IN ADVANCE IF YOU ARE UNABLE TO KEEP YOUR APPOINTMENT.  [phone] AND  PLEASE NOTIFY NUCLEAR MEDICINE AT [facility] AT LEAST 24 HOURS IN ADVANCE IF YOU ARE UNABLE TO KEEP YOUR APPOINTMENT. [phone]  How to prepare for your Myoview test:  1. Do not eat or drink after midnight 2. No caffeine for 24 hours prior to test 3. No smoking 24 hours prior to test. 4. Your medication may be taken with water.  If your doctor stopped a medication because of this test, do not take that medication. 5. Ladies, please do not wear dresses.  Skirts or pants are appropriate. Please wear a short sleeve shirt. 6. No perfume, cologne or lotion. 7. Wear comfortable walking shoes. No heels!

## 2014-06-13 ENCOUNTER — Other Ambulatory Visit: Payer: Self-pay

## 2014-06-13 DIAGNOSIS — I214 Non-ST elevation (NSTEMI) myocardial infarction: Secondary | ICD-10-CM

## 2014-06-28 ENCOUNTER — Ambulatory Visit: Admission: RE | Admit: 2014-06-28 | Payer: Medicare Other | Source: Ambulatory Visit

## 2014-06-28 ENCOUNTER — Ambulatory Visit: Payer: Medicare Other

## 2014-07-11 ENCOUNTER — Encounter: Payer: Self-pay | Admitting: *Deleted

## 2014-07-11 ENCOUNTER — Ambulatory Visit: Payer: Medicare Other | Admitting: Cardiovascular Disease

## 2014-07-11 ENCOUNTER — Other Ambulatory Visit: Payer: Medicare Other

## 2014-07-12 ENCOUNTER — Other Ambulatory Visit: Payer: Self-pay

## 2014-07-12 MED ORDER — FENOFIBRATE 48 MG PO TABS: 48.0000 mg | ORAL_TABLET | Freq: Every day | ORAL | Status: DC

## 2014-07-12 NOTE — Telephone Encounter (Signed)
Refill sent for Fenofibrate 48 mg

## 2014-12-05 DIAGNOSIS — I240 Acute coronary thrombosis not resulting in myocardial infarction: Secondary | ICD-10-CM | POA: Insufficient documentation

## 2015-01-01 DIAGNOSIS — E876 Hypokalemia: Secondary | ICD-10-CM | POA: Diagnosis present

## 2015-01-01 DIAGNOSIS — Z7982 Long term (current) use of aspirin: Secondary | ICD-10-CM

## 2015-01-01 DIAGNOSIS — I10 Essential (primary) hypertension: Secondary | ICD-10-CM | POA: Diagnosis present

## 2015-01-01 DIAGNOSIS — I214 Non-ST elevation (NSTEMI) myocardial infarction: Principal | ICD-10-CM | POA: Diagnosis present

## 2015-01-01 DIAGNOSIS — I2510 Atherosclerotic heart disease of native coronary artery without angina pectoris: Secondary | ICD-10-CM | POA: Diagnosis present

## 2015-01-01 DIAGNOSIS — E785 Hyperlipidemia, unspecified: Secondary | ICD-10-CM | POA: Diagnosis present

## 2015-01-01 DIAGNOSIS — Z79899 Other long term (current) drug therapy: Secondary | ICD-10-CM

## 2015-01-01 DIAGNOSIS — M069 Rheumatoid arthritis, unspecified: Secondary | ICD-10-CM | POA: Diagnosis present

## 2015-01-01 DIAGNOSIS — Z87891 Personal history of nicotine dependence: Secondary | ICD-10-CM

## 2015-01-01 DIAGNOSIS — Z8249 Family history of ischemic heart disease and other diseases of the circulatory system: Secondary | ICD-10-CM

## 2015-01-01 DIAGNOSIS — I252 Old myocardial infarction: Secondary | ICD-10-CM

## 2015-01-01 DIAGNOSIS — Z955 Presence of coronary angioplasty implant and graft: Secondary | ICD-10-CM

## 2015-01-01 DIAGNOSIS — E669 Obesity, unspecified: Secondary | ICD-10-CM | POA: Diagnosis present

## 2015-01-01 DIAGNOSIS — Z6834 Body mass index (BMI) 34.0-34.9, adult: Secondary | ICD-10-CM

## 2015-01-02 ENCOUNTER — Encounter: Payer: Self-pay | Admitting: Emergency Medicine

## 2015-01-02 ENCOUNTER — Encounter
Admission: EM | Disposition: A | Discharge status: Home / Self Care | Payer: Self-pay | Source: Home / Self Care | Attending: Internal Medicine

## 2015-01-02 ENCOUNTER — Inpatient Hospital Stay
Admission: EM | Admit: 2015-01-02 | Discharge: 2015-01-03 | DRG: 247 | Disposition: A | Discharge status: Home / Self Care | Payer: Medicare Other | Attending: Internal Medicine | Admitting: Internal Medicine

## 2015-01-02 ENCOUNTER — Emergency Department: Payer: Medicare Other

## 2015-01-02 DIAGNOSIS — I209 Angina pectoris, unspecified: Secondary | ICD-10-CM | POA: Diagnosis not present

## 2015-01-02 DIAGNOSIS — M069 Rheumatoid arthritis, unspecified: Secondary | ICD-10-CM | POA: Diagnosis present

## 2015-01-02 DIAGNOSIS — I252 Old myocardial infarction: Secondary | ICD-10-CM | POA: Diagnosis not present

## 2015-01-02 DIAGNOSIS — I5189 Other ill-defined heart diseases: Secondary | ICD-10-CM | POA: Diagnosis present

## 2015-01-02 DIAGNOSIS — Z8249 Family history of ischemic heart disease and other diseases of the circulatory system: Secondary | ICD-10-CM | POA: Diagnosis not present

## 2015-01-02 DIAGNOSIS — E876 Hypokalemia: Secondary | ICD-10-CM | POA: Diagnosis present

## 2015-01-02 DIAGNOSIS — I25118 Atherosclerotic heart disease of native coronary artery with other forms of angina pectoris: Secondary | ICD-10-CM | POA: Diagnosis not present

## 2015-01-02 DIAGNOSIS — Z7982 Long term (current) use of aspirin: Secondary | ICD-10-CM | POA: Diagnosis not present

## 2015-01-02 DIAGNOSIS — Z87891 Personal history of nicotine dependence: Secondary | ICD-10-CM | POA: Diagnosis not present

## 2015-01-02 DIAGNOSIS — Z955 Presence of coronary angioplasty implant and graft: Secondary | ICD-10-CM | POA: Insufficient documentation

## 2015-01-02 DIAGNOSIS — M0579 Rheumatoid arthritis with rheumatoid factor of multiple sites without organ or systems involvement: Secondary | ICD-10-CM | POA: Diagnosis present

## 2015-01-02 DIAGNOSIS — E785 Hyperlipidemia, unspecified: Secondary | ICD-10-CM | POA: Diagnosis present

## 2015-01-02 DIAGNOSIS — E669 Obesity, unspecified: Secondary | ICD-10-CM | POA: Diagnosis present

## 2015-01-02 DIAGNOSIS — I214 Non-ST elevation (NSTEMI) myocardial infarction: Secondary | ICD-10-CM

## 2015-01-02 DIAGNOSIS — I10 Essential (primary) hypertension: Secondary | ICD-10-CM | POA: Diagnosis not present

## 2015-01-02 DIAGNOSIS — I2510 Atherosclerotic heart disease of native coronary artery without angina pectoris: Secondary | ICD-10-CM | POA: Diagnosis present

## 2015-01-02 DIAGNOSIS — Z79899 Other long term (current) drug therapy: Secondary | ICD-10-CM | POA: Diagnosis not present

## 2015-01-02 DIAGNOSIS — Z6834 Body mass index (BMI) 34.0-34.9, adult: Secondary | ICD-10-CM | POA: Diagnosis not present

## 2015-01-02 HISTORY — PX: CARDIAC CATHETERIZATION: SHX172

## 2015-01-02 LAB — BASIC METABOLIC PANEL
Anion gap: 7 (ref 5–15)
BUN: 14 mg/dL (ref 6–20)
CHLORIDE: 104 mmol/L (ref 101–111)
CO2: 28 mmol/L (ref 22–32)
Calcium: 9.4 mg/dL (ref 8.9–10.3)
Creatinine, Ser: 0.62 mg/dL (ref 0.44–1.00)
GFR calc non Af Amer: >60 mL/min (ref >60)
GLUCOSE: 132 mg/dL — AB (ref 65–99)
Potassium: 3.4 mmol/L — ABNORMAL LOW (ref 3.5–5.1)
Sodium: 139 mmol/L (ref 135–145)

## 2015-01-02 LAB — TROPONIN I
TROPONIN I: 2.5 ng/mL — AB (ref <0.031)
TROPONIN I: 1.53 ng/mL — AB (ref <0.031)
Troponin I: 0.04 ng/mL — ABNORMAL HIGH (ref <0.031)
Troponin I: 0.63 ng/mL — ABNORMAL HIGH (ref <0.031)
Troponin I: 2.29 ng/mL — ABNORMAL HIGH (ref <0.031)

## 2015-01-02 LAB — CBC
HEMATOCRIT: 36.9 % (ref 35.0–47.0)
Hemoglobin: 11.9 g/dL — ABNORMAL LOW (ref 12.0–16.0)
MCH: 29.3 pg (ref 26.0–34.0)
MCHC: 32.3 g/dL (ref 32.0–36.0)
MCV: 90.6 fL (ref 80.0–100.0)
Platelets: 342 10*3/uL (ref 150–440)
RBC: 4.08 MIL/uL (ref 3.80–5.20)
RDW: 14.1 % (ref 11.5–14.5)
WBC: 7.7 10*3/uL (ref 3.6–11.0)

## 2015-01-02 LAB — PROTIME-INR
INR: 1.02
Prothrombin Time: 13.6 seconds (ref 11.4–15.0)

## 2015-01-02 LAB — APTT: aPTT: 35 seconds (ref 24–36)

## 2015-01-02 LAB — HEMOGLOBIN A1C: Hgb A1c MFr Bld: 6.3 % — ABNORMAL HIGH (ref 4.0–6.0)

## 2015-01-02 LAB — HEPARIN LEVEL (UNFRACTIONATED): Heparin Unfractionated: <0.1 IU/mL — ABNORMAL LOW (ref 0.30–0.70)

## 2015-01-02 SURGERY — LEFT HEART CATH AND CORONARY ANGIOGRAPHY
Anesthesia: Moderate Sedation

## 2015-01-02 MED ORDER — ASPIRIN EC 81 MG PO TBEC: 81.0000 mg | DELAYED_RELEASE_TABLET | Freq: Every day | ORAL | Status: DC

## 2015-01-02 MED ORDER — NITROGLYCERIN 5 MG/ML IV SOLN
INTRAVENOUS | Status: AC
  Filled 2015-01-02: qty 10

## 2015-01-02 MED ORDER — ONDANSETRON HCL 4 MG PO TABS: 4.0000 mg | ORAL_TABLET | Freq: Four times a day (QID) | ORAL | Status: DC | PRN

## 2015-01-02 MED ORDER — SODIUM CHLORIDE 0.9 % IV SOLN: 250.0000 mL | INTRAVENOUS | Status: DC | PRN

## 2015-01-02 MED ORDER — SODIUM CHLORIDE 0.9 % IV SOLN
INTRAVENOUS | Status: DC
  Administered 2015-01-02: 14:00:00 via INTRAVENOUS

## 2015-01-02 MED ORDER — DOCUSATE SODIUM 100 MG PO CAPS
100.0000 mg | ORAL_CAPSULE | Freq: Two times a day (BID) | ORAL | Status: DC
  Administered 2015-01-02 – 2015-01-03 (×3): 100 mg via ORAL
  Filled 2015-01-02 (×3): qty 1

## 2015-01-02 MED ORDER — FOLIC ACID 1 MG PO TABS
1.0000 mg | ORAL_TABLET | Freq: Every morning | ORAL | Status: DC
  Administered 2015-01-02 – 2015-01-03 (×2): 1 mg via ORAL
  Filled 2015-01-02 (×2): qty 1

## 2015-01-02 MED ORDER — MIDAZOLAM HCL 2 MG/2ML IJ SOLN
INTRAMUSCULAR | Status: DC | PRN
  Administered 2015-01-02 (×2): 1 mg via INTRAVENOUS

## 2015-01-02 MED ORDER — BIVALIRUDIN 250 MG IV SOLR
250.0000 mg | INTRAVENOUS | Status: DC | PRN
  Administered 2015-01-02: 1.75 mg/kg/h via INTRAVENOUS

## 2015-01-02 MED ORDER — PANTOPRAZOLE SODIUM 40 MG PO TBEC
40.0000 mg | DELAYED_RELEASE_TABLET | Freq: Every day | ORAL | Status: DC
  Administered 2015-01-02 – 2015-01-03 (×2): 40 mg via ORAL
  Filled 2015-01-02 (×2): qty 1

## 2015-01-02 MED ORDER — CLOPIDOGREL BISULFATE 75 MG PO TABS
ORAL_TABLET | ORAL | Status: AC
  Filled 2015-01-02: qty 8

## 2015-01-02 MED ORDER — HEPARIN (PORCINE) IN NACL 2-0.9 UNIT/ML-% IJ SOLN
INTRAMUSCULAR | Status: AC
  Filled 2015-01-02: qty 500

## 2015-01-02 MED ORDER — SODIUM CHLORIDE 0.9 % IJ SOLN: 3.0000 mL | INTRAMUSCULAR | Status: DC | PRN

## 2015-01-02 MED ORDER — BIVALIRUDIN 250 MG IV SOLR
INTRAVENOUS | Status: AC
  Filled 2015-01-02: qty 250

## 2015-01-02 MED ORDER — LISINOPRIL 5 MG PO TABS
5.0000 mg | ORAL_TABLET | Freq: Every day | ORAL | Status: DC
  Filled 2015-01-02: qty 1

## 2015-01-02 MED ORDER — ONDANSETRON HCL 4 MG/2ML IJ SOLN: 4.0000 mg | Freq: Four times a day (QID) | INTRAMUSCULAR | Status: DC | PRN

## 2015-01-02 MED ORDER — IOHEXOL 300 MG/ML  SOLN
INTRAMUSCULAR | Status: DC | PRN
  Administered 2015-01-02: 70 mL via INTRA_ARTERIAL
  Administered 2015-01-02: 65 mL via INTRA_ARTERIAL
  Administered 2015-01-02: 30 mL via INTRA_ARTERIAL

## 2015-01-02 MED ORDER — SODIUM CHLORIDE 0.9 % IV SOLN: INTRAVENOUS | Status: DC

## 2015-01-02 MED ORDER — LISINOPRIL 10 MG PO TABS
10.0000 mg | ORAL_TABLET | Freq: Every day | ORAL | Status: DC
  Administered 2015-01-03: 10 mg via ORAL
  Filled 2015-01-02: qty 1

## 2015-01-02 MED ORDER — SODIUM CHLORIDE 0.9 % IJ SOLN
3.0000 mL | Freq: Two times a day (BID) | INTRAMUSCULAR | Status: DC
  Administered 2015-01-02 – 2015-01-03 (×2): 3 mL via INTRAVENOUS

## 2015-01-02 MED ORDER — ACETAMINOPHEN 650 MG RE SUPP: 650.0000 mg | Freq: Four times a day (QID) | RECTAL | Status: DC | PRN

## 2015-01-02 MED ORDER — ASPIRIN EC 325 MG PO TBEC
325.0000 mg | DELAYED_RELEASE_TABLET | Freq: Every day | ORAL | Status: DC
  Administered 2015-01-02: 325 mg via ORAL
  Filled 2015-01-02: qty 1

## 2015-01-02 MED ORDER — ACETAMINOPHEN 325 MG PO TABS: 650.0000 mg | ORAL_TABLET | Freq: Four times a day (QID) | ORAL | Status: DC | PRN

## 2015-01-02 MED ORDER — METOPROLOL TARTRATE 1 MG/ML IV SOLN
INTRAVENOUS | Status: DC | PRN
  Administered 2015-01-02: 5 mg via INTRAVENOUS

## 2015-01-02 MED ORDER — FENTANYL CITRATE (PF) 100 MCG/2ML IJ SOLN
INTRAMUSCULAR | Status: AC
  Filled 2015-01-02: qty 2

## 2015-01-02 MED ORDER — SODIUM CHLORIDE 0.9 % IJ SOLN
3.0000 mL | Freq: Two times a day (BID) | INTRAMUSCULAR | Status: DC
  Administered 2015-01-03: 3 mL via INTRAVENOUS

## 2015-01-02 MED ORDER — NITROGLYCERIN 2 % TD OINT
0.5000 [in_us] | TOPICAL_OINTMENT | Freq: Four times a day (QID) | TRANSDERMAL | Status: DC
  Administered 2015-01-02 (×2): 0.5 [in_us] via TOPICAL
  Filled 2015-01-02 (×2): qty 1

## 2015-01-02 MED ORDER — GABAPENTIN 300 MG PO CAPS
600.0000 mg | ORAL_CAPSULE | Freq: Two times a day (BID) | ORAL | Status: DC
  Administered 2015-01-02 – 2015-01-03 (×3): 600 mg via ORAL
  Filled 2015-01-02 (×3): qty 2

## 2015-01-02 MED ORDER — HEPARIN BOLUS VIA INFUSION
3500.0000 [IU] | Freq: Once | INTRAVENOUS | Status: AC
  Administered 2015-01-02: 3500 [IU] via INTRAVENOUS
  Filled 2015-01-02: qty 3500

## 2015-01-02 MED ORDER — FENTANYL CITRATE (PF) 100 MCG/2ML IJ SOLN
INTRAMUSCULAR | Status: DC | PRN
  Administered 2015-01-02 (×3): 50 ug via INTRAVENOUS

## 2015-01-02 MED ORDER — MIDAZOLAM HCL 2 MG/2ML IJ SOLN
INTRAMUSCULAR | Status: AC
  Filled 2015-01-02: qty 2

## 2015-01-02 MED ORDER — SODIUM CHLORIDE 0.9 % IJ SOLN: 3.0000 mL | Freq: Two times a day (BID) | INTRAMUSCULAR | Status: DC

## 2015-01-02 MED ORDER — ASPIRIN 81 MG PO CHEW
324.0000 mg | CHEWABLE_TABLET | Freq: Once | ORAL | Status: AC
  Administered 2015-01-02: 324 mg via ORAL
  Filled 2015-01-02: qty 4

## 2015-01-02 MED ORDER — FENOFIBRATE 54 MG PO TABS
54.0000 mg | ORAL_TABLET | Freq: Every day | ORAL | Status: DC
  Administered 2015-01-02 – 2015-01-03 (×2): 54 mg via ORAL
  Filled 2015-01-02 (×2): qty 1

## 2015-01-02 MED ORDER — CARVEDILOL 6.25 MG PO TABS: 6.2500 mg | ORAL_TABLET | Freq: Two times a day (BID) | ORAL | Status: DC

## 2015-01-02 MED ORDER — ATENOLOL 25 MG PO TABS
50.0000 mg | ORAL_TABLET | Freq: Every morning | ORAL | Status: DC
Start: 2015-01-02 — End: 2015-01-02
  Filled 2015-01-02: qty 2

## 2015-01-02 MED ORDER — LABETALOL HCL 5 MG/ML IV SOLN
10.0000 mg | INTRAVENOUS | Status: DC | PRN
  Administered 2015-01-02: 10 mg via INTRAVENOUS
  Filled 2015-01-02: qty 4

## 2015-01-02 MED ORDER — CYCLOBENZAPRINE HCL 10 MG PO TABS
10.0000 mg | ORAL_TABLET | Freq: Every day | ORAL | Status: DC
  Administered 2015-01-02: 10 mg via ORAL
  Filled 2015-01-02: qty 1

## 2015-01-02 MED ORDER — POTASSIUM CHLORIDE CRYS ER 20 MEQ PO TBCR
40.0000 meq | EXTENDED_RELEASE_TABLET | Freq: Once | ORAL | Status: AC
  Administered 2015-01-02: 40 meq via ORAL
  Filled 2015-01-02: qty 2

## 2015-01-02 MED ORDER — LISINOPRIL 10 MG PO TABS
10.0000 mg | ORAL_TABLET | Freq: Every day | ORAL | Status: DC
  Administered 2015-01-02: 10 mg via ORAL
  Filled 2015-01-02: qty 1

## 2015-01-02 MED ORDER — CLOPIDOGREL BISULFATE 75 MG PO TABS
75.0000 mg | ORAL_TABLET | Freq: Every day | ORAL | Status: DC
  Administered 2015-01-02 – 2015-01-03 (×2): 75 mg via ORAL
  Filled 2015-01-02 (×2): qty 1

## 2015-01-02 MED ORDER — ASPIRIN 81 MG PO CHEW
81.0000 mg | CHEWABLE_TABLET | Freq: Every day | ORAL | Status: DC
  Administered 2015-01-03: 81 mg via ORAL
  Filled 2015-01-02: qty 1

## 2015-01-02 MED ORDER — BIVALIRUDIN BOLUS VIA INFUSION - CUPID
INTRAVENOUS | Status: DC | PRN
  Administered 2015-01-02: 65.85 mg via INTRAVENOUS

## 2015-01-02 MED ORDER — OXYCODONE-ACETAMINOPHEN 5-325 MG PO TABS
1.0000 | ORAL_TABLET | Freq: Four times a day (QID) | ORAL | Status: DC | PRN
  Administered 2015-01-02: 1 via ORAL
  Filled 2015-01-02: qty 1

## 2015-01-02 MED ORDER — CLOPIDOGREL BISULFATE 75 MG PO TABS
ORAL_TABLET | ORAL | Status: DC | PRN
  Administered 2015-01-02: 600 mg via ORAL

## 2015-01-02 MED ORDER — METOPROLOL TARTRATE 1 MG/ML IV SOLN
INTRAVENOUS | Status: AC
  Filled 2015-01-02: qty 5

## 2015-01-02 MED ORDER — MORPHINE SULFATE (PF) 2 MG/ML IV SOLN: 2.0000 mg | INTRAVENOUS | Status: DC | PRN

## 2015-01-02 MED ORDER — HEPARIN (PORCINE) IN NACL 100-0.45 UNIT/ML-% IJ SOLN
700.0000 [IU]/h | INTRAMUSCULAR | Status: DC
  Administered 2015-01-02: 700 [IU]/h via INTRAVENOUS
  Filled 2015-01-02: qty 250

## 2015-01-02 MED ORDER — ATORVASTATIN CALCIUM 20 MG PO TABS: 80.0000 mg | ORAL_TABLET | Freq: Every evening | ORAL | Status: DC

## 2015-01-02 SURGICAL SUPPLY — 18 items
BALLN TREK RX 2.75X15 (BALLOONS) ×2
BALLN ~~LOC~~ TREK RX 3.5X20 (BALLOONS) ×2
BALLOON TREK RX 2.75X15 (BALLOONS) ×1 IMPLANT
BALLOON ~~LOC~~ TREK RX 3.5X20 (BALLOONS) ×1 IMPLANT
CATH INFINITI 5 FR 3DRC (CATHETERS) ×2 IMPLANT
CATH INFINITI 5FR ANG PIGTAIL (CATHETERS) ×2 IMPLANT
CATH INFINITI 5FR JL4 (CATHETERS) ×2 IMPLANT
CATH INFINITI JR4 5F (CATHETERS) ×2 IMPLANT
CATH VISTA GUIDE 6FR 3DRC (CATHETERS) ×2 IMPLANT
DEVICE CLOSURE MYNXGRIP 6/7F (Vascular Products) ×2 IMPLANT
DEVICE INFLAT 30 PLUS (MISCELLANEOUS) ×2 IMPLANT
KIT MANI 3VAL PERCEP (MISCELLANEOUS) ×2 IMPLANT
NEEDLE PERC 18GX7CM (NEEDLE) ×2 IMPLANT
PACK CARDIAC CATH (CUSTOM PROCEDURE TRAY) ×2 IMPLANT
SHEATH AVANTI 5FR X 11CM (SHEATH) ×2 IMPLANT
STENT XIENCE ALPINE RX 3.25X28 (Permanent Stent) ×2 IMPLANT
WIRE EMERALD 3MM-J .035X150CM (WIRE) ×2 IMPLANT
WIRE RUNTHROUGH .014X180CM (WIRE) ×2 IMPLANT

## 2015-01-02 NOTE — Progress Notes (Signed)
Patient is alert and oriented x 4 admitted to room 242 with a diagnosis of NSTEMI. Denied any acute pain. No respiratory distress noted. Patient is oriented to her room, call bell/ascom and staff. Skin assessment done by Aundria Mems RN, they reported no skin issues. Will continue to monitor.

## 2015-01-02 NOTE — ED Notes (Signed)
MD at bedside. 

## 2015-01-02 NOTE — Consult Note (Signed)
CARDIOLOGY CONSULT NOTE  Patient ID: Karen Cervantes MRN: [id number] DOB/AGE: August 20, 1955 59 y.o.  Admit date: 01/02/2015 Referring Physician : Dr. Marcille Blanco.  Primary Physician : Dr. Clide Deutscher Primary Cardiologist : Dr. Cloyd Stagers at [facility] Reason for Consultation : NSTEMI  HPI:  This is a pleasant 59 year old female who presented with chest pain last night and was found to have elevated troponin. She is well-known to me as she had non-ST elevation myocardial infarction in May of this year. She underwent cardiac catheterization at that time which showed 95% mid LAD stenosis and 60% ostial and distal RCA stenosis. Angioplasty and drug-eluting stent placement was done to the mid LAD. Ejection fraction was normal by echocardiogram. A follow-up nuclear stress test in 4-6 weeks was recommended to evaluate for ischemia in the RCA distribution. The patient switched to [facility] for insurance reasons and has been following with Dr. Cloyd Stagers. She attended cardiac rehabilitation.  She has other chronic medical conditions that include rheumatoid arthritis and hypertension.  she has been taking her medications regularly.  She presented with substernal chest tightness, heartburn and belching which happened last night and lasted for a few hours. Initial troponin was 0.04 and increased to 0.63. She is currently chest pain-free.   Review of systems complete and found to be negative unless listed above   Past Medical History  Diagnosis Date  . CAD (coronary artery disease)     a. NSTEMI 04/2014; b. cardiac cath 05/30/2014: ost LAD 30%, mLAD 95% s/p PCI/DES, ost RCA 60%, pRCA 50%, dRCA 60%, EF >55% by echo  . HLD (hyperlipidemia)   . Obesity   . RA (rheumatoid arthritis) ([place])   . Diastolic dysfunction     a. echo 04/2014: EF 123456, no WMA, diastolic dysfunction, no valvular abnormalities, normal RVSP  . Hypertension     Family History  Problem Relation Age of Onset  . Heart attack Father   . Heart disease Father   . Heart  Problems Brother   . Heart Problems Brother   . Heart Problems Brother     Social History   Social History  . Marital Status: Married    Spouse Name: N/A  . Number of Children: N/A  . Years of Education: N/A   Occupational History  . Not on file.   Social History Main Topics  . Smoking status: Former Research scientist (life sciences)  . Smokeless tobacco: Not on file  . Alcohol Use: No  . Drug Use: No  . Sexual Activity: Not on file   Other Topics Concern  . Not on file   Social History Narrative    Past Surgical History  Procedure Laterality Date  . Cardiac catheterization N/A 05/30/2014    Procedure: Left Heart Cath and Coronary Angiography;  Surgeon: Wellington Hampshire, MD;  Location: [facility];  Service: Cardiovascular;  Laterality: N/A;  . Cardiac catheterization N/A 05/30/2014    Procedure: Coronary Stent Intervention;  Surgeon: Wellington Hampshire, MD;  Location: [facility];  Service: Cardiovascular;  Laterality: N/A;     Prescriptions prior to admission  Medication Sig Dispense Refill Last Dose  . aspirin EC 81 MG tablet Take 1 tablet (81 mg total) by mouth daily. 30 tablet 0 01/02/2015 at Unknown time  . atenolol (TENORMIN) 50 MG tablet Take 50 mg by mouth every morning.    01/02/2015 at Unknown time  . atorvastatin (LIPITOR) 80 MG tablet Take 80 mg by mouth every evening.   01/02/2015 at Unknown time  .  cyclobenzaprine (FLEXERIL) 10 MG tablet Take 10 mg by mouth at bedtime.   01/02/2015 at Unknown time  . fenofibrate (TRICOR) 48 MG tablet Take 1 tablet (48 mg total) by mouth daily. 30 tablet 6 01/02/2015 at Unknown time  . FOLIC ACID PO Take 1 tablet by mouth every morning.    01/02/2015 at Unknown time  . gabapentin (NEURONTIN) 300 MG capsule Take 600 mg by mouth 2 (two) times daily.    01/02/2015 at Unknown time  . lisinopril (PRINIVIL,ZESTRIL) 5 MG tablet Take 1 tablet (5 mg total) by mouth daily. 30 tablet 5 01/02/2015 at Unknown time  . Methotrexate, Anti-Rheumatic,  (METHOTREXATE, PF, [place]) Inject 0.4 mLs into the skin once a week. Once a week   12/28/2014 at Unknown time  . Multiple Vitamins-Minerals (MULTIVITAMIN PO) Take 1 tablet by mouth every morning.   01/02/2015 at Unknown time  . omeprazole (PRILOSEC) 20 MG capsule Take 20 mg by mouth every morning.    01/02/2015 at Unknown time  . oxyCODONE-acetaminophen (PERCOCET) 10-325 MG tablet Take 1 tablet by mouth every 6 (six) hours as needed for pain.   01/02/2015 at Unknown time  . simvastatin (ZOCOR) 40 MG tablet Take 1 tablet (40 mg total) by mouth at bedtime. (Patient not taking: Reported on 01/02/2015) 30 tablet 0 Not Taking at Unknown time  . sucralfate (CARAFATE) 1 G tablet Take 1 tablet (1 g total) by mouth 4 (four) times daily -  before meals and at bedtime. (Patient not taking: Reported on 01/02/2015) 60 tablet 0 Not Taking at Unknown time  . ticagrelor (BRILINTA) 90 MG TABS tablet Take 1 tablet (90 mg total) by mouth 2 (two) times daily. (Patient not taking: Reported on 01/02/2015) 60 tablet 0 Not Taking at Unknown time    Physical Exam: Blood pressure 160/69, pulse 78, temperature 98.1 F (36.7 C), temperature source Oral, resp. rate 13, height 5\' 3"  (1.6 m), weight 193 lb 8 oz (87.771 kg), SpO2 99 %.  Constitutional: She is oriented to person, place, and time. She appears well-developed and well-nourished. No distress.  HENT: No nasal discharge.  Head: Normocephalic and atraumatic.  Eyes: Pupils are equal and round. No discharge.  Neck: Normal range of motion. Neck supple. No JVD present. No thyromegaly present.  Cardiovascular: Normal rate, regular rhythm, normal heart sounds. Exam reveals no gallop and no friction rub. No murmur heard.  Pulmonary/Chest: Effort normal and breath sounds normal. No stridor. No respiratory distress. She has no wheezes. She has no rales. She exhibits no tenderness.  Abdominal: Soft. Bowel sounds are normal. She exhibits no distension. There is no tenderness. There is no  rebound and no guarding.  Musculoskeletal: Normal range of motion. She exhibits no edema and no tenderness.  Neurological: She is alert and oriented to person, place, and time. Coordination normal.  Skin: Skin is warm and dry. No rash noted. She is not diaphoretic. No erythema. No pallor.  Psychiatric: She has a normal mood and affect. Her behavior is normal. Judgment and thought content normal.      Labs:   Lab Results  Component Value Date   WBC 7.7 01/02/2015   HGB 11.9* 01/02/2015   HCT 36.9 01/02/2015   MCV 90.6 01/02/2015   PLT 342 01/02/2015    Recent Labs Lab 01/02/15 0016  NA 139  K 3.4*  CL 104  CO2 28  BUN 14  CREATININE 0.62  CALCIUM 9.4  GLUCOSE 132*   Lab Results  Component Value Date  CKMB 5.3* 05/28/2014   TROPONINI 0.63* 01/02/2015      EKG:  Sinus tachycardia with possible left atrial enlargement. Possible old septal infarct.    ASSESSMENT AND PLAN:    1. Non-ST elevation myocardial infarction: The patient has been taking her medications regularly including dual antiplatelet therapy. EKG does not show acute changes. The patient had previous LAD drug-eluting stent placement and is known to have borderline significant RCA disease. I recommend proceeding with cardiac catheterization and possible coronary intervention. I discussed risks, benefits and alternatives and details. The procedure could not be done via the right radial artery last time and the patient is not able to straighten her left arm due to rheumatoid arthritis. I will plan right femoral artery access.  2. Hypertension:  blood pressure has been elevated since her presentation. I'm going to switch atenolol to carvedilol and also increase the dose of fosinopril.   3. Hyperlipidemia: Continue high dose atorvastatin. She is also on fenofibrate for elevated triglyceride.     Signed: Kathlyn Sacramento MD, [facility] 01/02/2015, 9:02 AM

## 2015-01-02 NOTE — Progress Notes (Signed)
   01/02/15 1110  Clinical Encounter Type  Visited With Patient and family together  Visit Type Initial  Consult/Referral To Chaplain  Spiritual Encounters  Spiritual Needs Emotional  Stress Factors  Patient Stress Factors Health changes  Met w/patient & family. Pt. was engaging and exhibited good morale. Provided pastoral care & prayer.  Chap. Malene Blaydes G. [place], ext, [place]

## 2015-01-02 NOTE — ED Provider Notes (Signed)
[facility] Emergency Department Provider Note  ____________________________________________  Time seen: Approximately 0037 AM  I have reviewed the triage vital signs and the nursing notes.   HISTORY  Chief Complaint Chest Pain    HPI Karen Cervantes is a 59 y.o. female with a history of heart disease and a stent who comes in today with some chest pain. The patient reports that she woke up around 10:30 PM with some chest pressure. She reports that she got a glass of water and burped. She reports that after she burped the pain went away. She reports though that it was short-lived as the symptoms returned and she was able to do that 3 times. After the third episode though the patient reports that she developed some pain in her jaw. She describes the discomfort as pressure and not his pain. She reports that she did have some significant pain with radiation to the jaw when she had an MI previously. The patient's cardiologist is in [place]. The patient denies any nausea or vomiting denies any sweats shortness of breath headache or dizziness. She reports that the pain is currently gone. When the patient started having the symptoms she took some baby aspirin but does not take nitroglycerin. The patient is here for evaluation for her chest pain.   Past Medical History  Diagnosis Date  . CAD (coronary artery disease)     a. NSTEMI 04/2014; b. cardiac cath 05/30/2014: ost LAD 30%, mLAD 95% s/p PCI/DES, ost RCA 60%, pRCA 50%, dRCA 60%, EF >55% by echo  . HLD (hyperlipidemia)   . Obesity   . RA (rheumatoid arthritis) ([place])   . Diastolic dysfunction     a. echo 04/2014: EF 123456, no WMA, diastolic dysfunction, no valvular abnormalities, normal RVSP  . Hypertension     Patient Active Problem List   Diagnosis Date Noted  . Essential hypertension 06/10/2014  . CAD (coronary artery disease)   . HLD (hyperlipidemia)   . Obesity   . RA (rheumatoid arthritis) ([place])   .  Diastolic dysfunction   . NSTEMI (non-ST elevated myocardial infarction) ([place]) 05/28/2014    Past Surgical History  Procedure Laterality Date  . Cardiac catheterization N/A 05/30/2014    Procedure: Left Heart Cath and Coronary Angiography;  Surgeon: Wellington Hampshire, MD;  Location: [place] [facility];  Service: Cardiovascular;  Laterality: N/A;  . Cardiac catheterization N/A 05/30/2014    Procedure: Coronary Stent Intervention;  Surgeon: Wellington Hampshire, MD;  Location: [place] [facility];  Service: Cardiovascular;  Laterality: N/A;    Current Outpatient Rx  Name  Route  Sig  Dispense  Refill  . aspirin EC 81 MG tablet   Oral   Take 1 tablet (81 mg total) by mouth daily.   30 tablet   0   . atenolol (TENORMIN) 50 MG tablet   Oral   Take 50 mg by mouth every morning.          Marland Kitchen atorvastatin (LIPITOR) 80 MG tablet   Oral   Take 80 mg by mouth every evening.         . cyclobenzaprine (FLEXERIL) 10 MG tablet   Oral   Take 10 mg by mouth at bedtime.         . fenofibrate (TRICOR) 48 MG tablet   Oral   Take 1 tablet (48 mg total) by mouth daily.   30 tablet   6   . FOLIC ACID PO   Oral  Take 1 tablet by mouth every morning.          . gabapentin (NEURONTIN) 300 MG capsule   Oral   Take 600 mg by mouth 2 (two) times daily.          Marland Kitchen lisinopril (PRINIVIL,ZESTRIL) 5 MG tablet   Oral   Take 1 tablet (5 mg total) by mouth daily.   30 tablet   5   . Methotrexate, Anti-Rheumatic, (METHOTREXATE, PF, [place])   Subcutaneous   Inject 0.4 mLs into the skin once a week. Once a week         . Multiple Vitamins-Minerals (MULTIVITAMIN PO)   Oral   Take 1 tablet by mouth every morning.         Marland Kitchen omeprazole (PRILOSEC) 20 MG capsule   Oral   Take 20 mg by mouth every morning.          Marland Kitchen oxyCODONE-acetaminophen (PERCOCET) 10-325 MG tablet   Oral   Take 1 tablet by mouth every 6 (six) hours as needed for pain.         . simvastatin (ZOCOR) 40 MG tablet    Oral   Take 1 tablet (40 mg total) by mouth at bedtime. Patient not taking: Reported on 01/02/2015   30 tablet   0   . sucralfate (CARAFATE) 1 G tablet   Oral   Take 1 tablet (1 g total) by mouth 4 (four) times daily -  before meals and at bedtime. Patient not taking: Reported on 01/02/2015   60 tablet   0   . ticagrelor (BRILINTA) 90 MG TABS tablet   Oral   Take 1 tablet (90 mg total) by mouth 2 (two) times daily. Patient not taking: Reported on 01/02/2015   60 tablet   0     Allergies Remicade  Family History  Problem Relation Age of Onset  . Heart attack Father   . Heart disease Father   . Heart Problems Brother   . Heart Problems Brother   . Heart Problems Brother     Social History Social History  Substance Use Topics  . Smoking status: Former Research scientist (life sciences)  . Smokeless tobacco: None  . Alcohol Use: No    Review of Systems Constitutional: No fever/chills Eyes: No visual changes. ENT: No sore throat. Cardiovascular:chest pain. Respiratory: Denies shortness of breath. Gastrointestinal: No abdominal pain.  No nausea, no vomiting.  No diarrhea.  No constipation. Genitourinary: Negative for dysuria. Musculoskeletal: Negative for back pain. Skin: Negative for rash. Neurological: Negative for headaches, focal weakness or numbness.  10-point ROS otherwise negative.  ____________________________________________   PHYSICAL EXAM:  VITAL SIGNS: ED Triage Vitals  Enc Vitals Group     BP 01/02/15 0004 191/82 mmHg     Pulse Rate 01/02/15 0004 103     Resp 01/02/15 0004 18     Temp 01/02/15 0004 98 F (36.7 C)     Temp Source 01/02/15 0004 Oral     SpO2 01/02/15 0004 100 %     Weight 01/02/15 0004 192 lb (87.091 kg)     Height 01/02/15 0004 5\' 3"  (1.6 m)     Head Cir --      Peak Flow --      Pain Score 01/02/15 0004 0     Pain Loc --      Pain Edu? --      Excl. in [place]? --     Constitutional: Alert and oriented. Well appearing and in no  acute  distress. Eyes: Conjunctivae are normal. PERRL. EOMI. Head: Atraumatic. Nose: No congestion/rhinnorhea. Mouth/Throat: Mucous membranes are moist.  Oropharynx non-erythematous. Cardiovascular: Normal rate, regular rhythm. Grossly normal heart sounds.  Good peripheral circulation. Respiratory: Normal respiratory effort.  No retractions. Lungs CTAB. Gastrointestinal: Soft and nontender. No distention. Positive bowel sounds Musculoskeletal: No lower extremity tenderness nor edema.   Neurologic:  Normal speech and language. No gross focal neurologic deficits are appreciated. Skin:  Skin is warm, dry and intact.  Psychiatric: Mood and affect are normal.   ____________________________________________   LABS (all labs ordered are listed, but only abnormal results are displayed)  Labs Reviewed  BASIC METABOLIC PANEL - Abnormal; Notable for the following:    Potassium 3.4 (*)    Glucose, Bld 132 (*)    All other components within normal limits  CBC - Abnormal; Notable for the following:    Hemoglobin 11.9 (*)    All other components within normal limits  TROPONIN I - Abnormal; Notable for the following:    Troponin I 0.04 (*)    All other components within normal limits  TROPONIN I - Abnormal; Notable for the following:    Troponin I 0.63 (*)    All other components within normal limits   ____________________________________________  EKG  ED ECG REPORT I, Loney Hering, the attending physician, personally viewed and interpreted this ECG.   Date: 01/02/2015  EKG Time: 0005  Rate: 101  Rhythm: sinus tachycardia  Axis: normal  Intervals:none  ST&T Change: none  ____________________________________________  RADIOLOGY  CXR: minimal bibasilar atelectasis noted. Lungs otherwise clear ____________________________________________   PROCEDURES  Procedure(s) performed: None  Critical Care performed: No  ____________________________________________   INITIAL  IMPRESSION / ASSESSMENT AND PLAN / ED COURSE  Pertinent labs & imaging results that were available during my care of the patient were reviewed by me and considered in my medical decision making (see chart for details).  This is a 59 year old female who comes in with a history of heart disease and MI with some chest pain this evening. We'll repeat the patient's troponin given her history and the mild elevation of her troponin. The patient is not having any pain at this time and her vital signs are unremarkable.  The patient's initial troponin was 0.04 but the repeat was higher at 0.63. I will admit the patient to the hospitalist service for further evaluation of her chest pain and elevated troponin. It appears as though the patient may be having an NSTEMI but her pain is improved at this time. The hospitalist will order anticoagulation for the patient. The patient did receive 324 aspirin in the emergency department.  ____________________________________________   FINAL CLINICAL IMPRESSION(S) / ED DIAGNOSES  Final diagnoses:  Ischemic chest pain ([place])  NSTEMI (non-ST elevated myocardial infarction) ([place])      Loney Hering, MD 01/02/15 [phone]

## 2015-01-02 NOTE — H&P (Signed)
Karen Cervantes is an 59 y.o. female.   Chief Complaint: Chest pain HPI: The patient presents to the emergency department after a two-hour period of intermittent chest pain. The pain began at rest while she was laying in bed. She felt like it might be indigestion so she drank some water which relieved the pain to some degree. The pain returned and she tried drinking water again. The pain did not go away so she took a baby aspirin which eventually relieved her pain. Prior to the pain completely going away she felt it radiated into her jaw which was similar to her last heart attack (although notably, at that time pain started in her jaw and eventually radiated to her chest). Chest pain was not associated with nausea, vomiting, shortness of breath or diaphoresis, although she admits that it felt as if somebody was sitting on her chest. The patient has a history of coronary artery disease status post stenting. In the emergency department she was given the remainder 325 mg of aspirin. Initial troponin was negative but repeat showed elevation which prompted the emergency department staff to call for admission.  Past Medical History  Diagnosis Date  . CAD (coronary artery disease)     a. NSTEMI 04/2014; b. cardiac cath 05/30/2014: ost LAD 30%, mLAD 95% s/p PCI/DES, ost RCA 60%, pRCA 50%, dRCA 60%, EF >55% by echo  . HLD (hyperlipidemia)   . Obesity   . RA (rheumatoid arthritis) ([place])   . Diastolic dysfunction     a. echo 04/2014: EF >71%, no WMA, diastolic dysfunction, no valvular abnormalities, normal RVSP  . Hypertension     Past Surgical History  Procedure Laterality Date  . Cardiac catheterization N/A 05/30/2014    Procedure: Left Heart Cath and Coronary Angiography;  Surgeon: Wellington Hampshire, MD;  Location: [place] [facility];  Service: Cardiovascular;  Laterality: N/A;  . Cardiac catheterization N/A 05/30/2014    Procedure: Coronary Stent Intervention;  Surgeon: Wellington Hampshire, MD;  Location: [place] [facility];  Service: Cardiovascular;  Laterality: N/A;    Family History  Problem Relation Age of Onset  . Heart attack Father   . Heart disease Father   . Heart Problems Brother   . Heart Problems Brother   . Heart Problems Brother    Social History:  reports that she has quit smoking. She does not have any smokeless tobacco history on file. She reports that she does not drink alcohol or use illicit drugs.  Allergies:  Allergies  Allergen Reactions  . Remicade [Infliximab] Hives and Hypertension    Prior to Admission medications   Medication Sig Start Date End Date Taking? Authorizing Provider  aspirin EC 81 MG tablet Take 1 tablet (81 mg total) by mouth daily. 05/31/14  Yes Epifanio Lesches, MD  atenolol (TENORMIN) 50 MG tablet Take 50 mg by mouth every morning.    Yes Historical Provider, MD  atorvastatin (LIPITOR) 80 MG tablet Take 80 mg by mouth every evening.   Yes Historical Provider, MD  cyclobenzaprine (FLEXERIL) 10 MG tablet Take 10 mg by mouth at bedtime.   Yes Historical Provider, MD  fenofibrate (TRICOR) 48 MG tablet Take 1 tablet (48 mg total) by mouth daily. 07/12/14  Yes Wellington Hampshire, MD  FOLIC ACID PO Take 1 tablet by mouth every morning.    Yes Historical Provider, MD  gabapentin (NEURONTIN) 300 MG capsule Take 600 mg by mouth 2 (two) times daily.    Yes Historical Provider,  MD  lisinopril (PRINIVIL,ZESTRIL) 5 MG tablet Take 1 tablet (5 mg total) by mouth daily. 05/31/14  Yes Ryan M Dunn, PA-C  Methotrexate, Anti-Rheumatic, (METHOTREXATE, PF, [place]) Inject 0.4 mLs into the skin once a week. Once a week   Yes Historical Provider, MD  Multiple Vitamins-Minerals (MULTIVITAMIN PO) Take 1 tablet by mouth every morning.   Yes Historical Provider, MD  omeprazole (PRILOSEC) 20 MG capsule Take 20 mg by mouth every morning.    Yes Historical Provider, MD  oxyCODONE-acetaminophen (PERCOCET) 10-325 MG tablet Take 1 tablet by mouth every 6 (six) hours as needed for pain.    Yes Historical Provider, MD  simvastatin (ZOCOR) 40 MG tablet Take 1 tablet (40 mg total) by mouth at bedtime. Patient not taking: Reported on 01/02/2015 05/31/14   Epifanio Lesches, MD  sucralfate (CARAFATE) 1 G tablet Take 1 tablet (1 g total) by mouth 4 (four) times daily -  before meals and at bedtime. Patient not taking: Reported on 01/02/2015 05/31/14   Epifanio Lesches, MD  ticagrelor (BRILINTA) 90 MG TABS tablet Take 1 tablet (90 mg total) by mouth 2 (two) times daily. Patient not taking: Reported on 01/02/2015 05/31/14   Epifanio Lesches, MD     Results for orders placed or performed during the hospital encounter of 01/02/15 (from the past 48 hour(s))  Basic metabolic panel     Status: Abnormal   Collection Time: 01/02/15 12:16 AM  Result Value Ref Range   Sodium 139 135 - 145 mmol/L   Potassium 3.4 (L) 3.5 - 5.1 mmol/L   Chloride 104 101 - 111 mmol/L   CO2 28 22 - 32 mmol/L   Glucose, Bld 132 (H) 65 - 99 mg/dL   BUN 14 6 - 20 mg/dL   Creatinine, Ser 0.62 0.44 - 1.00 mg/dL   Calcium 9.4 8.9 - 10.3 mg/dL   GFR calc non Af Amer >60 >60 mL/min   GFR calc Af Amer >60 >60 mL/min    Comment: (NOTE) The eGFR has been calculated using the CKD EPI equation. This calculation has not been validated in all clinical situations. eGFR's persistently <60 mL/min signify possible Chronic Kidney Disease.    Anion gap 7 5 - 15  CBC     Status: Abnormal   Collection Time: 01/02/15 12:16 AM  Result Value Ref Range   WBC 7.7 3.6 - 11.0 K/uL   RBC 4.08 3.80 - 5.20 MIL/uL   Hemoglobin 11.9 (L) 12.0 - 16.0 g/dL   HCT 36.9 35.0 - 47.0 %   MCV 90.6 80.0 - 100.0 fL   MCH 29.3 26.0 - 34.0 pg   MCHC 32.3 32.0 - 36.0 g/dL   RDW 14.1 11.5 - 14.5 %   Platelets 342 150 - 440 K/uL  Troponin I     Status: Abnormal   Collection Time: 01/02/15 12:16 AM  Result Value Ref Range   Troponin I 0.04 (H) <0.031 ng/mL    Comment: READ BACK AND VERIFIED BY JERRIE THOMPSON AT 0151 01/02/15 [facility]         PERSISTENTLY INCREASED TROPONIN VALUES IN THE RANGE OF 0.04-0.49 ng/mL CAN BE SEEN IN:       -UNSTABLE ANGINA       -CONGESTIVE HEART FAILURE       -MYOCARDITIS       -CHEST TRAUMA       -ARRYHTHMIAS       -LATE PRESENTING MYOCARDIAL INFARCTION       -COPD   CLINICAL FOLLOW-UP RECOMMENDED.  Troponin I     Status: Abnormal   Collection Time: 01/02/15  3:17 AM  Result Value Ref Range   Troponin I 0.63 (H) <0.031 ng/mL    Comment: PREVIOUS RESULT CALLED TO JERRIE THOMPSON BY [facility] AT 0151 01/02/15 [facility]        POSSIBLE MYOCARDIAL ISCHEMIA. SERIAL TESTING RECOMMENDED.    Dg Chest 2 View  01/02/2015  CLINICAL DATA:  Acute onset of generalized chest pressure and generalized jaw pain. Initial encounter. EXAM: CHEST  2 VIEW COMPARISON:  Chest radiograph performed 05/27/2014 FINDINGS: The lungs are well-aerated. Minimal bibasilar atelectasis is noted. There is no evidence of pleural effusion or pneumothorax. The heart is normal in size; the mediastinal contour is within normal limits. No acute osseous abnormalities are seen. IMPRESSION: Minimal bibasilar atelectasis noted.  Lungs otherwise clear. Electronically Signed   By: Garald Balding M.D.   On: 01/02/2015 00:34    Review of Systems  Constitutional: Negative for fever and chills.  HENT: Negative for sore throat and tinnitus.   Eyes: Negative for blurred vision and redness.  Respiratory: Negative for cough and shortness of breath.   Cardiovascular: Positive for chest pain (resolved). Negative for palpitations, orthopnea and PND.  Gastrointestinal: Negative for nausea, vomiting, abdominal pain and diarrhea.  Genitourinary: Negative for dysuria, urgency and frequency.  Musculoskeletal: Negative for myalgias and joint pain.  Skin: Negative for rash.       No lesions  Neurological: Negative for speech change, focal weakness and weakness.  Endo/Heme/Allergies: Does not bruise/bleed easily.       No temperature intolerance   Psychiatric/Behavioral: Negative for depression and suicidal ideas.    Blood pressure 172/74, pulse 81, temperature 98 F (36.7 C), temperature source Oral, resp. rate 14, height 5' 3" (1.6 m), weight 87.091 kg (192 lb), SpO2 94 %. Physical Exam  Vitals reviewed. Constitutional: She is oriented to person, place, and time. She appears well-developed and well-nourished. No distress.  HENT:  Head: Normocephalic and atraumatic.  Mouth/Throat: Oropharynx is clear and moist.  Eyes: Conjunctivae and EOM are normal. Pupils are equal, round, and reactive to light. No scleral icterus.  Neck: Normal range of motion. Neck supple. No JVD present. No tracheal deviation present. No thyromegaly present.  Cardiovascular: Normal rate, regular rhythm and normal heart sounds.  Exam reveals no gallop and no friction rub.   No murmur heard. Respiratory: Effort normal and breath sounds normal.  GI: Soft. Bowel sounds are normal. She exhibits no distension. There is no tenderness.  Genitourinary:  Deferred  Musculoskeletal: Normal range of motion. She exhibits no edema.  Lymphadenopathy:    She has no cervical adenopathy.  Neurological: She is alert and oriented to person, place, and time. No cranial nerve deficit. She exhibits normal muscle tone.  Skin: Skin is warm and dry. No rash noted. No erythema.  Psychiatric: She has a normal mood and affect. Her behavior is normal. Judgment and thought content normal.     Assessment/Plan This is a 59 year old Caucasian female with history of coronary artery disease status post PCI admitted for NSTEMI. 1. NSTEMI: Elevated troponin in setting of normal renal function and history of coronary artery disease. Start full dose heparin. Continue Brilinta. Monitor telemetry and follow cardiac enzymes. Cardiology consult placed. 2. Essential hypertension: Uncontrolled this time. I placed nitroglycerin paste on the patient's chest. Labetalol for SBP greater than 160. Continue  atenolol and lisinopril per home regimen. 3. Hyperlipidemia: Continue statin therapy and fenofibrate. Check lipid panel and 4. Rheumatoid  arthritis: Continue methotrexate every Wednesday 5. DVT prophylaxis: Full dose anticoagulation as above 6. GI prophylaxis: Omeprazole The patient is a full code. Time spent on admission orders and patient care approximately 45 minutes  Harrie Foreman 01/02/2015, 4:30 AM

## 2015-01-02 NOTE — Progress Notes (Signed)
[place] [facility] [place] NAME: Karen Cervantes    MR#:  [id number]  DATE OF BIRTH:  06/22/1955  SUBJECTIVE:  CHIEF COMPLAINT:   Chief Complaint  Patient presents with  . Chest Pain  No chest pain. On heparin drip.  REVIEW OF SYSTEMS:  CONSTITUTIONAL: No fever, fatigue or weakness.  EYES: No blurred or double vision.  EARS, NOSE, AND THROAT: No tinnitus or ear pain.  RESPIRATORY: No cough, shortness of breath, wheezing or hemoptysis.  CARDIOVASCULAR: No chest pain, orthopnea, edema.  GASTROINTESTINAL: No nausea, vomiting, diarrhea or abdominal pain.  GENITOURINARY: No dysuria, hematuria.  ENDOCRINE: No polyuria, nocturia,  HEMATOLOGY: No anemia, easy bruising or bleeding SKIN: No rash or lesion. MUSCULOSKELETAL: No joint pain or arthritis.   NEUROLOGIC: No tingling, numbness, weakness.  PSYCHIATRY: No anxiety or depression.   DRUG ALLERGIES:   Allergies  Allergen Reactions  . Remicade [Infliximab] Hives and Hypertension    VITALS:  Blood pressure 100/80, pulse 64, temperature 98.5 F (36.9 C), temperature source Oral, resp. rate 15, height 5\' 3"  (1.6 m), weight 87.771 kg (193 lb 8 oz), SpO2 96 %.  PHYSICAL EXAMINATION:  GENERAL:  59 y.o.-year-old patient lying in the bed with no acute distress. Obese. EYES: Pupils equal, round, reactive to light and accommodation. No scleral icterus. Extraocular muscles intact.  HEENT: Head atraumatic, normocephalic. Oropharynx and nasopharynx clear.  NECK:  Supple, no jugular venous distention. No thyroid enlargement, no tenderness.  LUNGS: Normal breath sounds bilaterally, no wheezing, rales,rhonchi or crepitation. No use of accessory muscles of respiration.  CARDIOVASCULAR: S1, S2 normal. No murmurs, rubs, or gallops.  ABDOMEN: Soft, nontender, nondistended. Bowel sounds present. No organomegaly or mass.  EXTREMITIES: No pedal edema, cyanosis, or clubbing.  NEUROLOGIC: Cranial nerves II  through XII are intact. Muscle strength 5/5 in all extremities. Sensation intact. Gait not checked.  PSYCHIATRIC: The patient is alert and oriented x 3.  SKIN: No obvious rash, lesion, or ulcer.    LABORATORY PANEL:   CBC  Recent Labs Lab 01/02/15 0016  WBC 7.7  HGB 11.9*  HCT 36.9  PLT 342   ------------------------------------------------------------------------------------------------------------------  Chemistries   Recent Labs Lab 01/02/15 0016  NA 139  K 3.4*  CL 104  CO2 28  GLUCOSE 132*  BUN 14  CREATININE 0.62  CALCIUM 9.4   ------------------------------------------------------------------------------------------------------------------  Cardiac Enzymes  Recent Labs Lab 01/02/15 1419  TROPONINI 2.50*   ------------------------------------------------------------------------------------------------------------------  RADIOLOGY:  Dg Chest 2 View  01/02/2015  CLINICAL DATA:  Acute onset of generalized chest pressure and generalized jaw pain. Initial encounter. EXAM: CHEST  2 VIEW COMPARISON:  Chest radiograph performed 05/27/2014 FINDINGS: The lungs are well-aerated. Minimal bibasilar atelectasis is noted. There is no evidence of pleural effusion or pneumothorax. The heart is normal in size; the mediastinal contour is within normal limits. No acute osseous abnormalities are seen. IMPRESSION: Minimal bibasilar atelectasis noted.  Lungs otherwise clear. Electronically Signed   By: Garald Balding M.D.   On: 01/02/2015 00:34    EKG:   Orders placed or performed during the hospital encounter of 01/02/15  . EKG 12-Lead  . EKG 12-Lead  . ED EKG within 10 minutes  . ED EKG within 10 minutes  . EKG 12-Lead immediately post procedure  . EKG 12-Lead  . EKG 12-Lead immediately post procedure    ASSESSMENT AND PLAN:   1. NSTEMI: with history of coronary artery disease.  Troponin is up to 2.5 while on heparin  drip.  Continue ASA, plavix and lipitor.  Cardiac  cath today per Dr. Fletcher Anon.  2. Essential hypertension: Uncontrolled this time.  continiue nitroglycerin, prn labetalol.  Continue atenolol and lisinopril.  3. Hyperlipidemia: Continue statin therapy and fenofibrate. Check lipid panel and  * hypokalemia. KCl.    All the records are reviewed and case discussed with Care Management/Social Workerr. Management plans discussed with the patient, her daugher and they are in agreement.  CODE STATUS: Full code.  TOTAL TIME TAKING CARE OF THIS PATIENT: 43  minutes.  Greater than 50% time was spent on coordination of care and face-to-face counseling.  POSSIBLE D/C IN 2 DAYS, DEPENDING ON CLINICAL CONDITION.   Demetrios Loll M.D on 01/02/2015 at 5:16 PM  Between 7am to 6pm - Pager - [phone]  After 6pm go to [email or web address] - password [email or web address] [facility]  Office  [phone]  CC: Primary care physician; Baltazar Apo, MD

## 2015-01-02 NOTE — ED Notes (Signed)
Pt taken to xray. Family in treatment room.

## 2015-01-02 NOTE — ED Notes (Signed)
Critical Lab value 0.04 Troponin; MD made aware.

## 2015-01-02 NOTE — Progress Notes (Signed)
Troponin 2.29 notified Christell Faith

## 2015-01-02 NOTE — ED Notes (Signed)
Pt says she had pressure to the left side of her chest that radiated up into left neck and jaw; started about 45 minutes ago; pt says when she stood up at her house to leave for the ED the pain went away; history of MI; pt alert and oriented x 3; talking in complete coherent sentences; pt says currently she is pain free

## 2015-01-02 NOTE — Progress Notes (Signed)
Pt complaining of chronic back pain, sciatica pain and headache. Says she takes percocet every 6 hours at home as needed. Okay to order per Dr. Verdell Carmine

## 2015-01-02 NOTE — Progress Notes (Signed)
ANTICOAGULATION CONSULT NOTE - Initial Consult  Pharmacy Consult for heparin drip Indication: chest pain/ACS  Allergies  Allergen Reactions  . Remicade [Infliximab] Hives and Hypertension    Patient Measurements: Height: 5\' 3"  (160 cm) Weight: 193 lb 8 oz (87.771 kg) IBW/kg (Calculated) : 52.4 Heparin Dosing Weight: 59kg  Vital Signs: Temp: 98.5 F (36.9 C) (12/05 1455) Temp Source: Oral (12/05 1102) BP: 164/75 mmHg (12/05 1827) Pulse Rate: 75 (12/05 1827)  Labs:  Recent Labs  01/02/15 0016 01/02/15 0317 01/02/15 0554 01/02/15 0906 01/02/15 1419  HGB 11.9*  --   --   --   --   HCT 36.9  --   --   --   --   PLT 342  --   --   --   --   APTT  --   --  35  --   --   LABPROT  --   --  13.6  --   --   INR  --   --  1.02  --   --   HEPARINUNFRC  --   --   --   --  <0.10*  CREATININE 0.62  --   --   --   --   TROPONINI 0.04* 0.63*  --  2.29* 2.50*    Estimated Creatinine Clearance: 79.6 mL/min (by C-G formula based on Cr of 0.62).   Medical History: Past Medical History  Diagnosis Date  . CAD (coronary artery disease)     a. NSTEMI 04/2014; b. cardiac cath 05/30/2014: ost LAD 30%, mLAD 95% s/p PCI/DES, ost RCA 60%, pRCA 50%, dRCA 60%, EF >55% by echo  . HLD (hyperlipidemia)   . Obesity   . RA (rheumatoid arthritis) ([place])   . Diastolic dysfunction     a. echo 04/2014: EF 123456, no WMA, diastolic dysfunction, no valvular abnormalities, normal RVSP  . Hypertension     Medications:    Assessment: Hgb 11.9  plt 342 INR 1.02  aPTT 35  Goal of Therapy:  Heparin level 0.3-0.7 units/ml Monitor platelets by anticoagulation protocol: Yes   Plan:  3500 unit bolus and initial rate of 700 units/hr. First anti-Xa 6 hours after start of infusion.  12/05:  Heparin gtt and consult d/c'd by Dr Fletcher Anon on 12/05 @ 18:49.   Cyniah Gossard D 01/02/2015,7:02 PM

## 2015-01-02 NOTE — Progress Notes (Addendum)
ANTICOAGULATION CONSULT NOTE - Initial Consult  Pharmacy Consult for heparin drip Indication: chest pain/ACS  Allergies  Allergen Reactions  . Remicade [Infliximab] Hives and Hypertension    Patient Measurements: Height: 5\' 3"  (160 cm) Weight: 193 lb 8 oz (87.771 kg) IBW/kg (Calculated) : 52.4 Heparin Dosing Weight: 59kg  Vital Signs: Temp: 98.1 F (36.7 C) (12/05 0513) Temp Source: Oral (12/05 0513) BP: 160/69 mmHg (12/05 0553) Pulse Rate: 78 (12/05 0553)  Labs:  Recent Labs  01/02/15 0016 01/02/15 0317  HGB 11.9*  --   HCT 36.9  --   PLT 342  --   CREATININE 0.62  --   TROPONINI 0.04* 0.63*    Estimated Creatinine Clearance: 79.6 mL/min (by C-G formula based on Cr of 0.62).   Medical History: Past Medical History  Diagnosis Date  . CAD (coronary artery disease)     a. NSTEMI 04/2014; b. cardiac cath 05/30/2014: ost LAD 30%, mLAD 95% s/p PCI/DES, ost RCA 60%, pRCA 50%, dRCA 60%, EF >55% by echo  . HLD (hyperlipidemia)   . Obesity   . RA (rheumatoid arthritis) ([place])   . Diastolic dysfunction     a. echo 04/2014: EF 123456, no WMA, diastolic dysfunction, no valvular abnormalities, normal RVSP  . Hypertension     Medications:    Assessment: Hgb 11.9  plt 342 INR 1.02  aPTT 35  Goal of Therapy:  Heparin level 0.3-0.7 units/ml Monitor platelets by anticoagulation protocol: Yes   Plan:  3500 unit bolus and initial rate of 700 units/hr. First anti-Xa 6 hours after start of infusion.  Lavaughn Bisig S 01/02/2015,6:24 AM

## 2015-01-02 NOTE — Progress Notes (Signed)
2nd IV attempted by 2 RN's unable to gain access

## 2015-01-03 ENCOUNTER — Encounter: Payer: Self-pay | Admitting: Nurse Practitioner

## 2015-01-03 ENCOUNTER — Telehealth: Payer: Self-pay

## 2015-01-03 DIAGNOSIS — I209 Angina pectoris, unspecified: Secondary | ICD-10-CM

## 2015-01-03 DIAGNOSIS — I25118 Atherosclerotic heart disease of native coronary artery with other forms of angina pectoris: Secondary | ICD-10-CM

## 2015-01-03 DIAGNOSIS — Z955 Presence of coronary angioplasty implant and graft: Secondary | ICD-10-CM

## 2015-01-03 DIAGNOSIS — E785 Hyperlipidemia, unspecified: Secondary | ICD-10-CM

## 2015-01-03 DIAGNOSIS — I10 Essential (primary) hypertension: Secondary | ICD-10-CM

## 2015-01-03 LAB — CBC
HCT: 35.9 % (ref 35.0–47.0)
HEMOGLOBIN: 12 g/dL (ref 12.0–16.0)
MCH: 30.4 pg (ref 26.0–34.0)
MCHC: 33.5 g/dL (ref 32.0–36.0)
MCV: 90.5 fL (ref 80.0–100.0)
PLATELETS: 314 10*3/uL (ref 150–440)
RBC: 3.96 MIL/uL (ref 3.80–5.20)
RDW: 14.5 % (ref 11.5–14.5)
WBC: 7.6 10*3/uL (ref 3.6–11.0)

## 2015-01-03 LAB — BASIC METABOLIC PANEL
Anion gap: 7 (ref 5–15)
BUN: 9 mg/dL (ref 6–20)
CHLORIDE: 105 mmol/L (ref 101–111)
CO2: 27 mmol/L (ref 22–32)
CREATININE: 0.45 mg/dL (ref 0.44–1.00)
Calcium: 9 mg/dL (ref 8.9–10.3)
GFR calc non Af Amer: >60 mL/min (ref >60)
GLUCOSE: 95 mg/dL (ref 65–99)
Potassium: 3.7 mmol/L (ref 3.5–5.1)
Sodium: 139 mmol/L (ref 135–145)

## 2015-01-03 MED ORDER — CARVEDILOL 12.5 MG PO TABS: 12.5000 mg | ORAL_TABLET | Freq: Two times a day (BID) | ORAL | Status: DC

## 2015-01-03 MED ORDER — NITROGLYCERIN 0.4 MG SL SUBL: 0.4000 mg | SUBLINGUAL_TABLET | SUBLINGUAL | Status: DC | PRN

## 2015-01-03 MED ORDER — CARVEDILOL 6.25 MG PO TABS: 6.2500 mg | ORAL_TABLET | Freq: Two times a day (BID) | ORAL | Status: DC

## 2015-01-03 MED ORDER — LISINOPRIL 10 MG PO TABS: 10.0000 mg | ORAL_TABLET | Freq: Every day | ORAL | Status: DC

## 2015-01-03 MED ORDER — CARVEDILOL 12.5 MG PO TABS: 12.5000 mg | ORAL_TABLET | Freq: Two times a day (BID) | ORAL | Status: AC

## 2015-01-03 MED ORDER — PANTOPRAZOLE SODIUM 40 MG PO TBEC: 40.0000 mg | DELAYED_RELEASE_TABLET | Freq: Every day | ORAL | Status: DC

## 2015-01-03 MED ORDER — CARVEDILOL 6.25 MG PO TABS
18.7500 mg | ORAL_TABLET | Freq: Two times a day (BID) | ORAL | Status: DC
  Administered 2015-01-03: 18.75 mg via ORAL
  Filled 2015-01-03: qty 1

## 2015-01-03 MED ORDER — CLOPIDOGREL BISULFATE 75 MG PO TABS: 75.0000 mg | ORAL_TABLET | Freq: Every day | ORAL | Status: DC

## 2015-01-03 NOTE — Telephone Encounter (Signed)
-----   Message from Blain Pais sent at 01/03/2015  9:34 AM EST ----- Regarding: tcm/ph 01/10/2015 11:30 Ignacia Bayley, NP

## 2015-01-03 NOTE — Telephone Encounter (Signed)
Attempted to contact pt regarding discharge from [facility] on 01/03/15. Left message asking pt to call back regarding discharge instructions and/or medications. Advised pt of appt w/ Ignacia Bayley, NP on 01/10/15 at 11:30am w/ [facility]. Asked pt to call back if unable to keep this appt.

## 2015-01-03 NOTE — Discharge Instructions (Signed)
Heart healthy diet. Activity as tolerated.  Angiogram An angiogram is an X-ray test. It is used to look at your blood vessels. For this test, a dye is put into the blood vessel being checked. The dye shows up on X-rays. It helps your doctor see if there is a blockage or other problem in the blood vessel. BEFORE THE PROCEDURE  Follow your doctor's instructions about limiting what you eat or drink.  Ask your doctor if you may drink enough water to take any needed medicines the morning of the test.  Plan to have someone take you home after the test.  If you go home the same day as the test, plan to have someone stay with you for 24 hours. PROCEDURE   An IV tube will be put into one of your veins.  You will be given a medicine that makes you relax (sedative).  Your skin will be washed and shaved where the thin tube (catheter) will be inserted. This will usually be done in the upper part of your leg (groin). It may also be done in your arm near the elbow or in your wrist.  You will be given a medicine that numbs the area where the tube will be inserted (local anesthetic).  The tube will be inserted into a blood vessel.  Using a type of X-ray (fluoroscopy) to see, your doctor will move the tube into the blood vessel to check it.  Dye will be put in through the tube. X-rays of your blood vessels will then be taken. Different health care providers and hospitals may do this procedure differently. AFTER THE PROCEDURE   If the test is done through the leg, you will be kept in bed lying flat for several hours. You will be told to not bend or cross your legs.   The area where the tube was inserted will be checked often.   The pulse in your feet or wrist will be checked often.   More tests or X-rays may be done.    This information is not intended to replace advice given to you by your health care provider. Make sure you discuss any questions you have with your health care provider.     Document Released: 04/12/2008 Document Revised: 02/04/2014 Document Reviewed: 06/17/2012 Elsevier Interactive Patient Education [employer].

## 2015-01-03 NOTE — Progress Notes (Signed)
Patient discharged via wheelchair and private vehicle. IV removed and catheter intact. All discharge instructions given and patient verbalizes understanding. Patient educated on post heart cath care and verbalizes understanding. Tele removed and returned.  prescriptions given to patient No distress noted at time of discharge.

## 2015-01-03 NOTE — Discharge Summary (Signed)
[facility] [place] [facility] [place] NAME: Karen Cervantes    MR#:  [id number]  DATE OF BIRTH:  [birth year]  DATE OF ADMISSION:  01/02/2015 ADMITTING PHYSICIAN: Harrie Foreman, MD  DATE OF DISCHARGE: 01/03/2015 11:48 AM  PRIMARY CARE PHYSICIAN: Baltazar Apo, MD    ADMISSION DIAGNOSIS:  NSTEMI (non-ST elevated myocardial infarction) ([place]) [I21.4] Ischemic chest pain ([place]) [I20.9]   DISCHARGE DIAGNOSIS:    SECONDARY DIAGNOSIS:   Past Medical History  Diagnosis Date  . CAD (coronary artery disease)     a. NSTEMI 04/2014; b. cardiac cath 05/30/2014: ost LAD 30%, mLAD 95% s/p PCI/DES, ost RCA 60%, pRCA 50%, dRCA 60%, EF >55% by echo;  c. 12/2014 NSTEMI/PCI: LM nl, LAD 30ost, patent mid stent, D1 small, D2 min dzs, D3 small, LCX min irregs, OM1 small, OM2 min irregs, OM3 70, RCA 60ost, 40p, 37m/36m/d, 70d (3.25x28 Xience Alpine DES), RPDA min irregs, EF 55-65%.  Marland Kitchen HLD (hyperlipidemia)   . Obesity   . RA (rheumatoid arthritis) ([place])   . Diastolic dysfunction     a. echo 04/2014: EF 123456, no WMA, diastolic dysfunction, no valvular abnormalities, normal RVSP;  b. 12/2014 EF 55-65% by LV gram.  . Hypertension     HOSPITAL COURSE:   1. NSTEMI: with history of coronary artery disease.  Troponin was up to 2.5 while on heparin drip. She has been treated with ASA, plavix and lipitor.  She got cardiac cath and stent placement yesterday by Dr. Fletcher Anon.  2. Essential hypertension: Better controlled withCoreg and lisinopril. Also treated with nitroglycerin, prn labetalol.  3. Hyperlipidemia: Treated with Lipitor and fenofibrate.  * hypokalemia. Improved with a potassium supplement. DISCHARGE CONDITIONS:   Stable, discharged to home today.  CONSULTS OBTAINED:  Treatment Team:  Wellington Hampshire, MD  DRUG ALLERGIES:   Allergies  Allergen Reactions  . Remicade [Infliximab] Hives and Hypertension    DISCHARGE MEDICATIONS:   Discharge Medication  List as of 01/03/2015 11:08 AM    START taking these medications   Details  carvedilol (COREG) 12.5 MG tablet Take 1 tablet (12.5 mg total) by mouth 2 (two) times daily with a meal., Starting 01/03/2015, Until Discontinued, Print    clopidogrel (PLAVIX) 75 MG tablet Take 1 tablet (75 mg total) by mouth daily., Starting 01/03/2015, Until Discontinued, Print    nitroGLYCERIN (NITROSTAT) 0.4 MG SL tablet Place 1 tablet (0.4 mg total) under the tongue every 5 (five) minutes as needed for chest pain., Starting 01/03/2015, Until Discontinued, Normal    pantoprazole (PROTONIX) 40 MG tablet Take 1 tablet (40 mg total) by mouth daily., Starting 01/03/2015, Until Discontinued, Normal      CONTINUE these medications which have CHANGED   Details  lisinopril (PRINIVIL,ZESTRIL) 10 MG tablet Take 1 tablet (10 mg total) by mouth daily., Starting 01/03/2015, Until Discontinued, Print      CONTINUE these medications which have NOT CHANGED   Details  aspirin EC 81 MG tablet Take 1 tablet (81 mg total) by mouth daily., Starting 05/31/2014, Until Discontinued, Normal    atorvastatin (LIPITOR) 80 MG tablet Take 80 mg by mouth every evening., Until Discontinued, Historical Med    cyclobenzaprine (FLEXERIL) 10 MG tablet Take 10 mg by mouth at bedtime., Until Discontinued, Historical Med    fenofibrate (TRICOR) 48 MG tablet Take 1 tablet (48 mg total) by mouth daily., Starting 07/12/2014, Until Discontinued, Normal    FOLIC ACID PO Take 1 tablet by mouth every morning. , Until  Discontinued, Historical Med    gabapentin (NEURONTIN) 300 MG capsule Take 600 mg by mouth 2 (two) times daily. , Until Discontinued, Historical Med    Methotrexate, Anti-Rheumatic, (METHOTREXATE, PF, [place]) Inject 0.4 mLs into the skin once a week. Once a week, Until Discontinued, Historical Med    Multiple Vitamins-Minerals (MULTIVITAMIN PO) Take 1 tablet by mouth every morning., Until Discontinued, Historical Med    oxyCODONE-acetaminophen  (PERCOCET) 10-325 MG tablet Take 1 tablet by mouth every 6 (six) hours as needed for pain., Until Discontinued, Historical Med    sucralfate (CARAFATE) 1 G tablet Take 1 tablet (1 g total) by mouth 4 (four) times daily -  before meals and at bedtime., Starting 05/31/2014, Until Discontinued, Normal      STOP taking these medications     atenolol (TENORMIN) 50 MG tablet      omeprazole (PRILOSEC) 20 MG capsule      simvastatin (ZOCOR) 40 MG tablet      ticagrelor (BRILINTA) 90 MG TABS tablet          DISCHARGE INSTRUCTIONS:    If you experience worsening of your admission symptoms, develop shortness of breath, life threatening emergency, suicidal or homicidal thoughts you must seek medical attention immediately by calling 911 or calling your MD immediately  if symptoms less severe.  You Must read complete instructions/literature along with all the possible adverse reactions/side effects for all the Medicines you take and that have been prescribed to you. Take any new Medicines after you have completely understood and accept all the possible adverse reactions/side effects.   Please note  You were cared for by a hospitalist during your hospital stay. If you have any questions about your discharge medications or the care you received while you were in the hospital after you are discharged, you can call the unit and asked to speak with the hospitalist on call if the hospitalist that took care of you is not available. Once you are discharged, your primary care physician will handle any further medical issues. Please note that NO REFILLS for any discharge medications will be authorized once you are discharged, as it is imperative that you return to your primary care physician (or establish a relationship with a primary care physician if you do not have one) for your aftercare needs so that they can reassess your need for medications and monitor your lab values.    Today   SUBJECTIVE    No  complaint.  VITAL SIGNS:  Blood pressure 156/71, pulse 89, temperature 97.9 F (36.6 C), temperature source Oral, resp. rate 18, height 5\' 3"  (1.6 m), weight 86.456 kg (190 lb 9.6 oz), SpO2 97 %.  I/O:   Intake/Output Summary (Last 24 hours) at 01/03/15 1637 Last data filed at 01/03/15 1130  Gross per 24 hour  Intake      0 ml  Output   2050 ml  Net  -2050 ml    PHYSICAL EXAMINATION:  GENERAL:  59 y.o.-year-old patient lying in the bed with no acute distress.  EYES: Pupils equal, round, reactive to light and accommodation. No scleral icterus. Extraocular muscles intact.  HEENT: Head atraumatic, normocephalic. Oropharynx and nasopharynx clear.  NECK:  Supple, no jugular venous distention. No thyroid enlargement, no tenderness.  LUNGS: Normal breath sounds bilaterally, no wheezing, rales,rhonchi or crepitation. No use of accessory muscles of respiration.  CARDIOVASCULAR: S1, S2 normal. No murmurs, rubs, or gallops.  ABDOMEN: Soft, non-tender, non-distended. Bowel sounds present. No organomegaly or mass.  EXTREMITIES:  No pedal edema, cyanosis, or clubbing.  NEUROLOGIC: Cranial nerves II through XII are intact. Muscle strength 5/5 in all extremities. Sensation intact. Gait not checked.  PSYCHIATRIC: The patient is alert and oriented x 3.  SKIN: No obvious rash, lesion, or ulcer.   DATA REVIEW:   CBC  Recent Labs Lab 01/03/15 0419  WBC 7.6  HGB 12.0  HCT 35.9  PLT 314    Chemistries   Recent Labs Lab 01/03/15 0419  NA 139  K 3.7  CL 105  CO2 27  GLUCOSE 95  BUN 9  CREATININE 0.45  CALCIUM 9.0    Cardiac Enzymes  Recent Labs Lab 01/02/15 1926  TROPONINI 1.53*    Microbiology Results  No results found for this or any previous visit.  RADIOLOGY:  Dg Chest 2 View  01/02/2015  CLINICAL DATA:  Acute onset of generalized chest pressure and generalized jaw pain. Initial encounter. EXAM: CHEST  2 VIEW COMPARISON:  Chest radiograph performed 05/27/2014 FINDINGS:  The lungs are well-aerated. Minimal bibasilar atelectasis is noted. There is no evidence of pleural effusion or pneumothorax. The heart is normal in size; the mediastinal contour is within normal limits. No acute osseous abnormalities are seen. IMPRESSION: Minimal bibasilar atelectasis noted.  Lungs otherwise clear. Electronically Signed   By: Garald Balding M.D.   On: 01/02/2015 00:34      I discussed with cardiologist.  Management plans discussed with the patient, family and they are in agreement.  CODE STATUS:  Advance Directive Documentation        Most Recent Value   Type of Advance Directive  Healthcare Power of Attorney   Pre-existing out of facility DNR order (yellow form or pink MOST form)     "MOST" Form in Place?        TOTAL TIME TAKING CARE OF THIS PATIENT: 35 minutes.    Demetrios Loll M.D on 01/03/2015 at 4:37 PM  Between 7am to 6pm - Pager - [phone]  After 6pm go to [email or web address] - password [email or web address] [facility]  Office  [phone]  CC: Primary care physician; Baltazar Apo, MD

## 2015-01-03 NOTE — Progress Notes (Signed)
Patient Name: Karen Cervantes Date of Encounter: 01/03/2015     Principal Problem:   NSTEMI (non-ST elevated myocardial infarction) [facility]) Active Problems:   CAD (coronary artery disease)   Ischemic chest pain ([place])   Obesity   Essential hypertension   HLD (hyperlipidemia)   RA (rheumatoid arthritis) ([facility])   Diastolic dysfunction    SUBJECTIVE  No chest pain or sob.  No right groin pain.  Ambulated w/o difficulty.  Eager to go home.  CURRENT MEDS . aspirin  81 mg Oral Daily  . atorvastatin  80 mg Oral QPM  . carvedilol  12.5 mg Oral BID WC  . clopidogrel  75 mg Oral Daily  . cyclobenzaprine  10 mg Oral QHS  . docusate sodium  100 mg Oral BID  . fenofibrate  54 mg Oral Daily  . folic acid  1 mg Oral q morning - 10a  . gabapentin  600 mg Oral BID  . lisinopril  10 mg Oral Daily  . nitroGLYCERIN  0.5 inch Topical 4 times per day  . pantoprazole  40 mg Oral Daily  . sodium chloride  3 mL Intravenous Q12H  . sodium chloride  3 mL Intravenous Q12H    OBJECTIVE  Filed Vitals:   01/02/15 1827 01/02/15 2037 01/03/15 0500 01/03/15 0512  BP: 164/75 142/85  167/67  Pulse: 75 68  76  Temp:  97.7 F (36.5 C)  97.9 F (36.6 C)  TempSrc:  Oral  Oral  Resp: 18 18  18   Height:      Weight:   190 lb 9.6 oz (86.456 kg)   SpO2: 98% 97%  96%    Intake/Output Summary (Last 24 hours) at 01/03/15 0909 Last data filed at 01/03/15 0700  Gross per 24 hour  Intake      0 ml  Output   2450 ml  Net  -2450 ml   Filed Weights   01/02/15 0004 01/02/15 0524 01/03/15 0500  Weight: 192 lb (87.091 kg) 193 lb 8 oz (87.771 kg) 190 lb 9.6 oz (86.456 kg)    PHYSICAL EXAM  General: Pleasant, NAD. Neuro: Alert and oriented X 3. Moves all extremities spontaneously. Psych: Normal affect. HEENT:  Normal  Neck: Supple without bruits or JVD. Lungs:  Resp regular and unlabored, CTA. Heart: RRR no s3, s4, or murmurs. Abdomen: Soft, non-tender, non-distended, BS + x 4.  Extremities: No  clubbing, cyanosis or edema. DP/PT/Radials 2+ and equal bilaterally.  R groin w/o bleeding/bruit/hematoma.  Accessory Clinical Findings  CBC  Recent Labs  01/02/15 0016 01/03/15 0419  WBC 7.7 7.6  HGB 11.9* 12.0  HCT 36.9 35.9  MCV 90.6 90.5  PLT 342 [id number]   Basic Metabolic Panel  Recent Labs  01/02/15 0016 01/03/15 0419  NA 139 139  K 3.4* 3.7  CL 104 105  CO2 28 27  GLUCOSE 132* 95  BUN 14 9  CREATININE 0.62 0.45  CALCIUM 9.4 9.0   Cardiac Enzymes  Recent Labs  01/02/15 0906 01/02/15 1419 01/02/15 1926  TROPONINI 2.29* 2.50* 1.53*   Hemoglobin A1C  Recent Labs  01/02/15 0554  HGBA1C 6.3*   Lab Results  Component Value Date   CHOL 324* 05/28/2014   HDL 36* 05/28/2014   LDLCALC SEE COMMENT 05/28/2014   TRIG 658* 05/28/2014    TELE  RSR  ECG  RSR, 66, prolonged QT, no acute st/t changes.  Radiology/Studies  Dg Chest 2 View  01/02/2015  CLINICAL DATA:  Acute onset  of generalized chest pressure and generalized jaw pain. Initial encounter. EXAM: CHEST  2 VIEW COMPARISON:  Chest radiograph performed 05/27/2014 FINDINGS: The lungs are well-aerated. Minimal bibasilar atelectasis is noted. There is no evidence of pleural effusion or pneumothorax. The heart is normal in size; the mediastinal contour is within normal limits. No acute osseous abnormalities are seen. IMPRESSION: Minimal bibasilar atelectasis noted.  Lungs otherwise clear. Electronically Signed   By: Garald Balding M.D.   On: 01/02/2015 00:34    ASSESSMENT AND PLAN  1.  NSTEMI/CAD:  S/p cath 12/5 revealing patent LAD stent with severe stenosis within the mid/dist RCA  3.25 x 28 mm Xience Alpine DES.  Cont asa, statin, bb, acei, high potency statin.  Cont plavix 75 daily.  Though home med rec says that she was on brilinta, she was actually on plavix (switched 2/2 cost over the summer).  She will need Rx for coreg and also prn nitrate.  Given that she is on plavix, I will switch prilosec to  protonix.  2.  Essential HTN:  BP trending 140's to 160's.  Prev on atenolol @ home but switched to coreg here.  Titrate coreg this AM. Cont lisinopril.  3.  HL/HTG:  Lipids from 04/2014 reviewed.  TG 658 @ that time.  She is on tricor @ home currently.  Home med list also shows simva and lipitor.  Family says she was switched off of simva to lipitor.  Cont lipitor 80 and tricor.  Signed, Murray Hodgkins NP    Attending Note Patient seen and examined, agree with detailed note above,  Patient presentation and plan discussed on rounds.   Patient seen on rounds today, doing well, without any significant chest pain concerning for angina On aspirin and Plavix Plan for discharge today Medication changes as above. She will follow up with Dr. Fletcher Anon in 2 weeks and follow-up at [facility], her cardiologist  Signed: Esmond Plants  M.D., Ph.D. [facility]

## 2015-01-03 NOTE — Progress Notes (Signed)
Per Dr. Bridgett Larsson, let cardiology team see patient prior to discharge.

## 2015-01-03 NOTE — Care Management (Signed)
Admitted for nstemi and had cardiac cath 12/5.  Required stent to RCA.  Patient to discharge on Plavix which will not be cost prohibitive

## 2015-01-04 ENCOUNTER — Encounter: Payer: Self-pay | Admitting: Cardiovascular Disease

## 2015-01-10 ENCOUNTER — Encounter: Payer: Medicare Other | Admitting: Nurse Practitioner

## 2015-01-17 ENCOUNTER — Encounter: Payer: Medicare Other | Attending: Cardiovascular Disease | Admitting: *Deleted

## 2015-01-17 VITALS — Ht 62.25 in | Wt 191.2 lb

## 2015-01-17 DIAGNOSIS — Z9861 Coronary angioplasty status: Secondary | ICD-10-CM | POA: Insufficient documentation

## 2015-01-17 NOTE — Progress Notes (Signed)
Cardiac Individual Treatment Plan  Patient Details  Name: Karen Cervantes MRN: [id number] Date of Birth: [birth year] Referring Provider:  Wellington Hampshire, MD  Initial Encounter Date: Date: 01/17/15  Visit Diagnosis: S/P PTCA (percutaneous transluminal coronary angioplasty)  Patient's Home Medications on Admission:  Current outpatient prescriptions:  .  aspirin EC 81 MG tablet, Take 1 tablet (81 mg total) by mouth daily., Disp: 30 tablet, Rfl: 0 .  atorvastatin (LIPITOR) 80 MG tablet, Take 80 mg by mouth every evening., Disp: , Rfl:  .  carvedilol (COREG) 12.5 MG tablet, Take 1 tablet (12.5 mg total) by mouth 2 (two) times daily with a meal., Disp: 30 tablet, Rfl: 0 .  clopidogrel (PLAVIX) 75 MG tablet, Take 1 tablet (75 mg total) by mouth daily., Disp: 30 tablet, Rfl: 0 .  cyclobenzaprine (FLEXERIL) 10 MG tablet, Take 10 mg by mouth at bedtime., Disp: , Rfl:  .  fenofibrate (TRICOR) 48 MG tablet, Take 1 tablet (48 mg total) by mouth daily., Disp: 30 tablet, Rfl: 6 .  FOLIC ACID PO, Take 1 tablet by mouth every morning. , Disp: , Rfl:  .  gabapentin (NEURONTIN) 300 MG capsule, Take 600 mg by mouth 2 (two) times daily. , Disp: , Rfl:  .  lisinopril (PRINIVIL,ZESTRIL) 10 MG tablet, Take 1 tablet (10 mg total) by mouth daily., Disp: 30 tablet, Rfl: 0 .  Methotrexate, Anti-Rheumatic, (METHOTREXATE, PF, [place]), Inject 0.4 mLs into the skin once a week. Once a week, Disp: , Rfl:  .  Multiple Vitamins-Minerals (MULTIVITAMIN PO), Take 1 tablet by mouth every morning., Disp: , Rfl:  .  nitroGLYCERIN (NITROSTAT) 0.4 MG SL tablet, Place 1 tablet (0.4 mg total) under the tongue every 5 (five) minutes as needed for chest pain., Disp: 25 tablet, Rfl: 3 .  omeprazole (PRILOSEC) 20 MG capsule, Take 20 mg by mouth 2 (two) times daily before a meal., Disp: , Rfl:  .  oxyCODONE-acetaminophen (PERCOCET) 10-325 MG tablet, Take 1 tablet by mouth every 6 (six) hours as needed for pain., Disp: , Rfl:  .  pantoprazole  (PROTONIX) 40 MG tablet, Take 1 tablet (40 mg total) by mouth daily. (Patient not taking: Reported on 01/17/2015), Disp: 30 tablet, Rfl: 6 .  sucralfate (CARAFATE) 1 G tablet, Take 1 tablet (1 g total) by mouth 4 (four) times daily -  before meals and at bedtime. (Patient not taking: Reported on 01/02/2015), Disp: 60 tablet, Rfl: 0  Past Medical History: Past Medical History  Diagnosis Date  . CAD (coronary artery disease)     a. NSTEMI 04/2014; b. cardiac cath 05/30/2014: ost LAD 30%, mLAD 95% s/p PCI/DES, ost RCA 60%, pRCA 50%, dRCA 60%, EF >55% by echo;  c. 12/2014 NSTEMI/PCI: LM nl, LAD 30ost, patent mid stent, D1 small, D2 min dzs, D3 small, LCX min irregs, OM1 small, OM2 min irregs, OM3 70, RCA 60ost, 40p, 21m/72m/d, 70d (3.25x28 Xience Alpine DES), RPDA min irregs, EF 55-65%.  Marland Kitchen HLD (hyperlipidemia)   . Obesity   . RA (rheumatoid arthritis) ([place])   . Diastolic dysfunction     a. echo 04/2014: EF 123456, no WMA, diastolic dysfunction, no valvular abnormalities, normal RVSP;  b. 12/2014 EF 55-65% by LV gram.  . Hypertension     Tobacco Use: History  Smoking status  . Former Smoker  Smokeless tobacco  . Not on file    Labs: Recent Review [place] for ITP Cardiac and Pulmonary Rehab Latest Ref Rng 05/27/2014 05/28/2014  01/02/2015   Cholestrol - - 324(H) -   LDLCALC - - SEE COMMENT -   HDL - - 36(L) -   Trlycerides - - 658(H) -   Hemoglobin A1c 4.0 - 6.0 % 7.3(H) - 6.3(H)       Exercise Target Goals: Date: 01/17/15  Exercise Program Goal: Individual exercise prescription set with THRR, safety & activity barriers. Participant demonstrates ability to understand and report RPE using BORG scale, to self-measure pulse accurately, and to acknowledge the importance of the exercise prescription.  Exercise Prescription Goal: Starting with aerobic activity 30 plus minutes a day, 3 days per week for initial exercise prescription. Provide home exercise prescription and guidelines  that participant acknowledges understanding prior to discharge.  Activity Barriers & Risk Stratification:     Activity Barriers & Risk Stratification - 01/17/15 0847    Activity Barriers & Risk Stratification   Activity Barriers Arthritis;Back Problems;Joint Problems  Has sciatia, gets injections for this. Feet hurt all the time, wearing shoes does help reduce the foot pain.  Arthritis , hands, shoulders, jaw, neck. Limited movement beacaue of the arthritis   Risk Stratification High      6 Minute Walk:     6 Minute Walk      01/17/15 1133       6 Minute Walk   Phase Initial     Distance 923 feet     Walk Time 5.25 minutes     Resting HR 56 bpm     Resting BP 152/84 mmHg     Max Ex. HR 97 bpm     Max Ex. BP 154/76 mmHg     RPE 13     Symptoms No        Initial Exercise Prescription:     Initial Exercise Prescription - 01/17/15 1100    Date of Initial Exercise Prescription   Date 01/17/15   Treadmill   MPH 1.3   Grade 0   Minutes 5  Use 5 minute intervals ie 2 x 5 min with rest inbetween   Bike   Level 0.2   Minutes 10   Recumbant Bike   Level 2   RPM 40   Watts 15   Minutes 10   NuStep   Level 2   Watts 20   Minutes 15   Arm Ergometer   Level 1   Watts 8   Minutes 10   Arm/Foot Ergometer   Level 4   Watts 15   Minutes 10   Cybex   Level 1   RPM 45   Minutes 10   Recumbant Elliptical   Level 1   RPM 40   Watts 10   Minutes 10   REL-XR   Level 2   Watts 30   Minutes 10   T5 Nustep   Level 1   Watts 10   Minutes 15   Biostep-RELP   Level 2   Watts 15   Minutes 15   Prescription Details   Frequency (times per week) 3   Duration Progress to 30 minutes of continuous aerobic without signs/symptoms of physical distress   Intensity   THRR REST +  30   Ratings of Perceived Exertion 11-15   Progression Continue progressive overload as per policy without signs/symptoms or physical distress.   Resistance Training   Training Prescription  Yes   Weight 2   Reps 10-15      Exercise Prescription Changes:     Exercise Prescription  Changes      01/17/15 0900           Response to Exercise   Blood Pressure (Admit) 152/84 mmHg       Blood Pressure (Exercise) 154/76 mmHg       Blood Pressure (Exit) 138/72 mmHg       Heart Rate (Admit) 54 bpm       Heart Rate (Exercise) 96 bpm       Heart Rate (Exit) 62 bpm       Rating of Perceived Exertion (Exercise) 13       Symptoms no cardiac symptoms       Comments HAD to stop for sciatica pain  which resolved iwth rest          Discharge Exercise Prescription (Final Exercise Prescription Changes):     Exercise Prescription Changes - 01/17/15 0900    Response to Exercise   Blood Pressure (Admit) 152/84 mmHg   Blood Pressure (Exercise) 154/76 mmHg   Blood Pressure (Exit) 138/72 mmHg   Heart Rate (Admit) 54 bpm   Heart Rate (Exercise) 96 bpm   Heart Rate (Exit) 62 bpm   Rating of Perceived Exertion (Exercise) 13   Symptoms no cardiac symptoms   Comments HAD to stop for sciatica pain  which resolved iwth rest      Nutrition:  Target Goals: Understanding of nutrition guidelines, daily intake of sodium 1500mg , cholesterol 200mg , calories 30% from fat and 7% or less from saturated fats, daily to have 5 or more servings of fruits and vegetables.  Biometrics:     Pre Biometrics - 01/17/15 1133    Pre Biometrics   Height 5' 2.25" (1.581 m)   Weight 191 lb 3.2 oz (86.728 kg)   Waist Circumference 38 inches   Hip Circumference 48 inches   Waist to Hip Ratio 0.79 %   BMI (Calculated) 34.8       Nutrition Therapy Plan and Nutrition Goals:     Nutrition Therapy & Goals - 01/17/15 0851    Intervention Plan   Intervention Using nutrition plan and personal goals to gain a healthy nutrition lifestyle. Add exercise as prescribed.      Nutrition Discharge: Rate Your Plate Scores:   Nutrition Goals Re-Evaluation:   Psychosocial: Target Goals: Acknowledge  presence or absence of depression, maximize coping skills, provide positive support system. Participant is able to verbalize types and ability to use techniques and skills needed for reducing stress and depression.  Initial Review & Psychosocial Screening:     Initial Psych Review & Screening - 01/17/15 0906    Family Dynamics   Good Support System? Yes   Barriers   Psychosocial barriers to participate in program There are no identifiable barriers or psychosocial needs.;The patient should benefit from training in stress management and relaxation.   Screening Interventions   Interventions Encouraged to exercise      Quality of Life Scores:     Quality of Life - 01/17/15 1227    Quality of Life Scores   Health/Function Pre 26.5 %   Socioeconomic Pre 30 %   Psych/Spiritual Pre 28.29 %   Family Pre 30 %   GLOBAL Pre 28.26 %      PHQ-9:     Recent Review Flowsheet Data    Depression screen [facility] 2/9 01/17/2015   Decreased Interest 0   Down, Depressed, Hopeless 0   PHQ - 2 Score 0   Altered sleeping 0   Tired, decreased energy 0  Change in appetite 1    Feeling bad or failure about yourself  0   Trouble concentrating 0   Moving slowly or fidgety/restless 0   Suicidal thoughts 0   PHQ-9 Score 1   Difficult doing work/chores Somewhat difficult      Psychosocial Evaluation and Intervention:   Psychosocial Re-Evaluation:   Vocational Rehabilitation: Provide vocational rehab assistance to qualifying candidates.   Vocational Rehab Evaluation & Intervention:     Vocational Rehab - 01/17/15 0849    Initial Vocational Rehab Evaluation & Intervention   Assessment shows need for Vocational Rehabilitation No      Education: Education Goals: Education classes will be provided on a weekly basis, covering required topics. Participant will state understanding/return demonstration of topics presented.  Learning Barriers/Preferences:     Learning Barriers/Preferences -  01/17/15 0849    Learning Barriers/Preferences   Learning Barriers None   Learning Preferences Written Material      Education Topics: General Nutrition Guidelines/Fats and Fiber: -Group instruction provided by verbal, written material, models and posters to present the general guidelines for heart healthy nutrition. Gives an explanation and review of dietary fats and fiber.   Controlling Sodium/Reading Food Labels: -Group verbal and written material supporting the discussion of sodium use in heart healthy nutrition. Review and explanation with models, verbal and written materials for utilization of the food label.   Exercise Physiology & Risk Factors: - Group verbal and written instruction with models to review the exercise physiology of the cardiovascular system and associated critical values. Details cardiovascular disease risk factors and the goals associated with each risk factor.   Aerobic Exercise & Resistance Training: - Gives group verbal and written discussion on the health impact of inactivity. On the components of aerobic and resistive training programs and the benefits of this training and how to safely progress through these programs.   Flexibility, Balance, General Exercise Guidelines: - Provides group verbal and written instruction on the benefits of flexibility and balance training programs. Provides general exercise guidelines with specific guidelines to those with heart or lung disease. Demonstration and skill practice provided.   Stress Management: - Provides group verbal and written instruction about the health risks of elevated stress, cause of high stress, and healthy ways to reduce stress.   Depression: - Provides group verbal and written instruction on the correlation between heart/lung disease and depressed mood, treatment options, and the stigmas associated with seeking treatment.   Anatomy & Physiology of the Heart: - Group verbal and written  instruction and models provide basic cardiac anatomy and physiology, with the coronary electrical and arterial systems. Review of: AMI, Angina, Valve disease, Heart Failure, Cardiac Arrhythmia, Pacemakers, and the ICD.   Cardiac Procedures: - Group verbal and written instruction and models to describe the testing methods done to diagnose heart disease. Reviews the outcomes of the test results. Describes the treatment choices: Medical Management, Angioplasty, or Coronary Bypass Surgery.   Cardiac Medications: - Group verbal and written instruction to review commonly prescribed medications for heart disease. Reviews the medication, class of the drug, and side effects. Includes the steps to properly store meds and maintain the prescription regimen.   Go Sex-Intimacy & Heart Disease, Get SMART - Goal Setting: - Group verbal and written instruction through game format to discuss heart disease and the return to sexual intimacy. Provides group verbal and written material to discuss and apply goal setting through the application of the S.M.A.R.T. Method.   Other Matters of the Heart: -  Provides group verbal, written materials and models to describe Heart Failure, Angina, Valve Disease, and Diabetes in the realm of heart disease. Includes description of the disease process and treatment options available to the cardiac patient.   Exercise & Equipment Safety: - Individual verbal instruction and demonstration of equipment use and safety with use of the equipment.          Cardiac Rehab from 01/17/2015 in [facility] Cardiac Rehab   Date  01/17/15   Educator  Sb   Instruction Review Code  2- meets goals/outcomes      Infection Prevention: - Provides verbal and written material to individual with discussion of infection control including proper hand washing and proper equipment cleaning during exercise session.      Cardiac Rehab from 01/17/2015 in [facility] Cardiac Rehab   Date  01/17/15   Educator  Sb    Instruction Review Code  2- meets goals/outcomes      Falls Prevention: - Provides verbal and written material to individual with discussion of falls prevention and safety.      Cardiac Rehab from 01/17/2015 in [facility] Cardiac Rehab   Date  01/17/15   Educator  SB   Instruction Review Code  2- meets goals/outcomes      Diabetes: - Individual verbal and written instruction to review signs/symptoms of diabetes, desired ranges of glucose level fasting, after meals and with exercise. Advice that pre and post exercise glucose checks will be done for 3 sessions at entry of program.    Knowledge Questionnaire Score:     Knowledge Questionnaire Score - 01/17/15 0849    Knowledge Questionnaire Score   Pre Score 23/28      Personal Goals and Risk Factors at Admission:     Personal Goals and Risk Factors at Admission - 01/17/15 0851    Personal Goals and Risk Factors on Admission    Weight Management Obesity;Yes   Intervention Learn and follow the exercise and diet guidelines while in the program. Utilize the nutrition and education classes to help gain knowledge of the diet and exercise expectations in the program   Intervention Provide weight management tools through evaluation completed by registered dietician and exercise physiologist.  Establish a goal weight with participant.   Admit Weight 193 lb (87.544 kg)   Goal Weight 140 lb (63.504 kg)   Increase Aerobic Exercise and Physical Activity Yes;Sedentary  Not able to get up from floor because of knees. Both knees have osteoarthritis and Sarahjane is waitnig to be set up for knee replacements.  Rheumatiod arthritis in other joints.  Feet hurt  constantly because of shifting of bones created by the arthritis..   Intervention While in program, learn and follow the exercise prescription taught. Start at a low level workload and increase workload after able to maintain previous level for 30 minutes. Increase time before increasing intensity.    Intervention Provide exercise education and an individualized exercise prescription that will provide continued progressive overload as per policy without signs/symptoms of physical distress.   Quit Smoking --  Quit 20 + years ago   Hypertension Yes   Goal Participant will see blood pressure controlled within the values of 140/66mm/Hg or within value directed by their physician.   Intervention Provide nutrition & aerobic exercise along with prescribed medications to achieve BP 140/90 or less.   Lipids Yes   Goal Cholesterol controlled with medications as prescribed, with individualized exercise RX and with personalized nutrition plan. Value goals: LDL < 70mg , HDL >  40mg . Participant states understanding of desired cholesterol values and following prescriptions.   Intervention Provide nutrition & aerobic exercise along with prescribed medications to achieve LDL 70mg , HDL >40mg .      Personal Goals and Risk Factors Review:    Personal Goals Discharge (Final Personal Goals and Risk Factors Review):    ITP Comments:     ITP Comments      01/17/15 0851           ITP Comments Initial orientation and ITP. Continue with ITP          Comments: initial ITP

## 2015-01-17 NOTE — Patient Instructions (Signed)
Patient Instructions  Patient Details  Name: Karen Cervantes MRN: [id number] Date of Birth: [birth year] Referring Provider:  Wellington Hampshire, MD  Below are the personal goals you chose as well as exercise and nutrition goals. Our goal is to help you keep on track towards obtaining and maintaining your goals. We will be discussing your progress on these goals with you throughout the program.  Initial Exercise Prescription:     Initial Exercise Prescription - 01/17/15 1100    Date of Initial Exercise Prescription   Date 01/17/15   Treadmill   MPH 1.3   Grade 0   Minutes 5  Use 5 minute intervals ie 2 x 5 min with rest inbetween   Bike   Level 0.2   Minutes 10   Recumbant Bike   Level 2   RPM 40   Watts 15   Minutes 10   NuStep   Level 2   Watts 20   Minutes 15   Arm Ergometer   Level 1   Watts 8   Minutes 10   Arm/Foot Ergometer   Level 4   Watts 15   Minutes 10   Cybex   Level 1   RPM 45   Minutes 10   Recumbant Elliptical   Level 1   RPM 40   Watts 10   Minutes 10   REL-XR   Level 2   Watts 30   Minutes 10   T5 Nustep   Level 1   Watts 10   Minutes 15   Biostep-RELP   Level 2   Watts 15   Minutes 15   Prescription Details   Frequency (times per week) 3   Duration Progress to 30 minutes of continuous aerobic without signs/symptoms of physical distress   Intensity   THRR REST +  30   Ratings of Perceived Exertion 11-15   Progression Continue progressive overload as per policy without signs/symptoms or physical distress.   Resistance Training   Training Prescription Yes   Weight 2   Reps 10-15      Exercise Goals: Frequency: Be able to perform aerobic exercise three times per week working toward 3-5 days per week.  Intensity: Work with a perceived exertion of 11 (fairly light) - 15 (hard) as tolerated. Follow your new exercise prescription and watch for changes in prescription as you progress with the program. Changes will be reviewed with you  when they are made.  Duration: You should be able to do 30 minutes of continuous aerobic exercise in addition to a 5 minute warm-up and a 5 minute cool-down routine.  Nutrition Goals: Your personal nutrition goals will be established when you do your nutrition analysis with the dietician.  The following are nutrition guidelines to follow: Cholesterol < 200mg /day Sodium < 1500mg /day Fiber: Women over 50 yrs - 21 grams per day  Personal Goals:     Personal Goals and Risk Factors at Admission - 01/17/15 0851    Personal Goals and Risk Factors on Admission    Weight Management Obesity;Yes   Intervention Learn and follow the exercise and diet guidelines while in the program. Utilize the nutrition and education classes to help gain knowledge of the diet and exercise expectations in the program   Intervention Provide weight management tools through evaluation completed by registered dietician and exercise physiologist.  Establish a goal weight with participant.   Admit Weight 193 lb (87.544 kg)   Goal Weight 140 lb (63.504 kg)  Increase Aerobic Exercise and Physical Activity Yes;Sedentary  Not able to get up from floor because of knees. Both knees have osteoarthritis and Karen Cervantes is waitnig to be set up for knee replacements.  Rheumatiod arthritis in other joints.  Feet hurt  constantly because of shifting of bones created by the arthritis..   Intervention While in program, learn and follow the exercise prescription taught. Start at a low level workload and increase workload after able to maintain previous level for 30 minutes. Increase time before increasing intensity.   Intervention Provide exercise education and an individualized exercise prescription that will provide continued progressive overload as per policy without signs/symptoms of physical distress.   Quit Smoking --  Quit 20 + years ago   Hypertension Yes   Goal Participant will see blood pressure controlled within the values of  140/5mm/Hg or within value directed by their physician.   Intervention Provide nutrition & aerobic exercise along with prescribed medications to achieve BP 140/90 or less.   Lipids Yes   Goal Cholesterol controlled with medications as prescribed, with individualized exercise RX and with personalized nutrition plan. Value goals: LDL < 70mg , HDL > 40mg . Participant states understanding of desired cholesterol values and following prescriptions.   Intervention Provide nutrition & aerobic exercise along with prescribed medications to achieve LDL 70mg , HDL >40mg .      Tobacco Use Initial Evaluation: History  Smoking status  . Former Smoker  Smokeless tobacco  . Not on file    Copy of goals given to participant.

## 2015-01-18 ENCOUNTER — Encounter: Payer: Medicare Other | Attending: Cardiovascular Disease

## 2015-01-18 DIAGNOSIS — Z9861 Coronary angioplasty status: Secondary | ICD-10-CM | POA: Insufficient documentation

## 2015-01-29 NOTE — Progress Notes (Signed)
Cardiac Individual Treatment Plan  Patient Details  Name: Karen Cervantes MRN: [id number] Date of Birth: Dec 31, 1955 Referring Provider:  Wellington Hampshire, MD  Initial Encounter Date: Date: 01/17/15  Visit Diagnosis: S/P PTCA (percutaneous transluminal coronary angioplasty) - Plan: CARDIAC REHAB 30 DAY REVIEW  Patient's Home Medications on Admission:  Current outpatient prescriptions:  .  aspirin EC 81 MG tablet, Take 1 tablet (81 mg total) by mouth daily., Disp: 30 tablet, Rfl: 0 .  atorvastatin (LIPITOR) 80 MG tablet, Take 80 mg by mouth every evening., Disp: , Rfl:  .  carvedilol (COREG) 12.5 MG tablet, Take 1 tablet (12.5 mg total) by mouth 2 (two) times daily with a meal., Disp: 30 tablet, Rfl: 0 .  clopidogrel (PLAVIX) 75 MG tablet, Take 1 tablet (75 mg total) by mouth daily., Disp: 30 tablet, Rfl: 0 .  cyclobenzaprine (FLEXERIL) 10 MG tablet, Take 10 mg by mouth at bedtime., Disp: , Rfl:  .  fenofibrate (TRICOR) 48 MG tablet, Take 1 tablet (48 mg total) by mouth daily., Disp: 30 tablet, Rfl: 6 .  FOLIC ACID PO, Take 1 tablet by mouth every morning. , Disp: , Rfl:  .  gabapentin (NEURONTIN) 300 MG capsule, Take 600 mg by mouth 2 (two) times daily. , Disp: , Rfl:  .  lisinopril (PRINIVIL,ZESTRIL) 10 MG tablet, Take 1 tablet (10 mg total) by mouth daily., Disp: 30 tablet, Rfl: 0 .  Methotrexate, Anti-Rheumatic, (METHOTREXATE, PF, [place]), Inject 0.4 mLs into the skin once a week. Once a week, Disp: , Rfl:  .  Multiple Vitamins-Minerals (MULTIVITAMIN PO), Take 1 tablet by mouth every morning., Disp: , Rfl:  .  nitroGLYCERIN (NITROSTAT) 0.4 MG SL tablet, Place 1 tablet (0.4 mg total) under the tongue every 5 (five) minutes as needed for chest pain., Disp: 25 tablet, Rfl: 3 .  omeprazole (PRILOSEC) 20 MG capsule, Take 20 mg by mouth 2 (two) times daily before a meal., Disp: , Rfl:  .  oxyCODONE-acetaminophen (PERCOCET) 10-325 MG tablet, Take 1 tablet by mouth every 6 (six) hours as needed for  pain., Disp: , Rfl:  .  pantoprazole (PROTONIX) 40 MG tablet, Take 1 tablet (40 mg total) by mouth daily. (Patient not taking: Reported on 01/17/2015), Disp: 30 tablet, Rfl: 6 .  sucralfate (CARAFATE) 1 G tablet, Take 1 tablet (1 g total) by mouth 4 (four) times daily -  before meals and at bedtime. (Patient not taking: Reported on 01/02/2015), Disp: 60 tablet, Rfl: 0  Past Medical History: Past Medical History  Diagnosis Date  . CAD (coronary artery disease)     a. NSTEMI 04/2014; b. cardiac cath 05/30/2014: ost LAD 30%, mLAD 95% s/p PCI/DES, ost RCA 60%, pRCA 50%, dRCA 60%, EF >55% by echo;  c. 12/2014 NSTEMI/PCI: LM nl, LAD 30ost, patent mid stent, D1 small, D2 min dzs, D3 small, LCX min irregs, OM1 small, OM2 min irregs, OM3 70, RCA 60ost, 40p, 66m/36m/d, 70d (3.25x28 Xience Alpine DES), RPDA min irregs, EF 55-65%.  Marland Kitchen HLD (hyperlipidemia)   . Obesity   . RA (rheumatoid arthritis) ([place])   . Diastolic dysfunction     a. echo 04/2014: EF 123456, no WMA, diastolic dysfunction, no valvular abnormalities, normal RVSP;  b. 12/2014 EF 55-65% by LV gram.  . Hypertension     Tobacco Use: History  Smoking status  . Former Smoker  Smokeless tobacco  . Not on file    Labs: Recent Review [employer] for ITP Cardiac and  Pulmonary Rehab Latest Ref Rng 05/27/2014 05/28/2014 01/02/2015   Cholestrol - - 324(H) -   LDLCALC - - SEE COMMENT -   HDL - - 36(L) -   Trlycerides - - 658(H) -   Hemoglobin A1c 4.0 - 6.0 % 7.3(H) - 6.3(H)       Exercise Target Goals: Date: 01/17/15  Exercise Program Goal: Individual exercise prescription set with THRR, safety & activity barriers. Participant demonstrates ability to understand and report RPE using BORG scale, to self-measure pulse accurately, and to acknowledge the importance of the exercise prescription.  Exercise Prescription Goal: Starting with aerobic activity 30 plus minutes a day, 3 days per week for initial exercise prescription. Provide  home exercise prescription and guidelines that participant acknowledges understanding prior to discharge.  Activity Barriers & Risk Stratification:     Activity Barriers & Risk Stratification - 01/17/15 0847    Activity Barriers & Risk Stratification   Activity Barriers Arthritis;Back Problems;Joint Problems  Has sciatia, gets injections for this. Feet hurt all the time, wearing shoes does help reduce the foot pain.  Arthritis , hands, shoulders, jaw, neck. Limited movement beacaue of the arthritis   Risk Stratification High      6 Minute Walk:     6 Minute Walk      01/17/15 1133       6 Minute Walk   Phase Initial     Distance 923 feet     Walk Time 5.25 minutes     Resting HR 56 bpm     Resting BP 152/84 mmHg     Max Ex. HR 97 bpm     Max Ex. BP 154/76 mmHg     RPE 13     Symptoms No        Initial Exercise Prescription:     Initial Exercise Prescription - 01/17/15 1100    Date of Initial Exercise Prescription   Date 01/17/15   Treadmill   MPH 1.3   Grade 0   Minutes 5  Use 5 minute intervals ie 2 x 5 min with rest inbetween   Bike   Level 0.2   Minutes 10   Recumbant Bike   Level 2   RPM 40   Watts 15   Minutes 10   NuStep   Level 2   Watts 20   Minutes 15   Arm Ergometer   Level 1   Watts 8   Minutes 10   Arm/Foot Ergometer   Level 4   Watts 15   Minutes 10   Cybex   Level 1   RPM 45   Minutes 10   Recumbant Elliptical   Level 1   RPM 40   Watts 10   Minutes 10   REL-XR   Level 2   Watts 30   Minutes 10   T5 Nustep   Level 1   Watts 10   Minutes 15   Biostep-RELP   Level 2   Watts 15   Minutes 15   Prescription Details   Frequency (times per week) 3   Duration Progress to 30 minutes of continuous aerobic without signs/symptoms of physical distress   Intensity   THRR REST +  30   Ratings of Perceived Exertion 11-15   Progression Continue progressive overload as per policy without signs/symptoms or physical distress.    Resistance Training   Training Prescription Yes   Weight 2   Reps 10-15      Exercise Prescription  Changes:     Exercise Prescription Changes      01/17/15 0900 01/24/15 0700         Exercise Review   Progression  No  Has not started program yet, med review was 01/17/15      Response to Exercise   Blood Pressure (Admit) 152/84 mmHg       Blood Pressure (Exercise) 154/76 mmHg       Blood Pressure (Exit) 138/72 mmHg       Heart Rate (Admit) 54 bpm       Heart Rate (Exercise) 96 bpm       Heart Rate (Exit) 62 bpm       Rating of Perceived Exertion (Exercise) 13       Symptoms no cardiac symptoms       Comments HAD to stop for sciatica pain  which resolved iwth rest          Discharge Exercise Prescription (Final Exercise Prescription Changes):     Exercise Prescription Changes - 01/24/15 0700    Exercise Review   Progression No  Has not started program yet, med review was 01/17/15      Nutrition:  Target Goals: Understanding of nutrition guidelines, daily intake of sodium 1500mg , cholesterol 200mg , calories 30% from fat and 7% or less from saturated fats, daily to have 5 or more servings of fruits and vegetables.  Biometrics:     Pre Biometrics - 01/17/15 1133    Pre Biometrics   Height 5' 2.25" (1.581 m)   Weight 191 lb 3.2 oz (86.728 kg)   Waist Circumference 38 inches   Hip Circumference 48 inches   Waist to Hip Ratio 0.79 %   BMI (Calculated) 34.8       Nutrition Therapy Plan and Nutrition Goals:     Nutrition Therapy & Goals - 01/17/15 0851    Intervention Plan   Intervention Using nutrition plan and personal goals to gain a healthy nutrition lifestyle. Add exercise as prescribed.      Nutrition Discharge: Rate Your Plate Scores:   Nutrition Goals Re-Evaluation:   Psychosocial: Target Goals: Acknowledge presence or absence of depression, maximize coping skills, provide positive support system. Participant is able to verbalize types and  ability to use techniques and skills needed for reducing stress and depression.  Initial Review & Psychosocial Screening:     Initial Psych Review & Screening - 01/17/15 0906    Family Dynamics   Good Support System? Yes   Barriers   Psychosocial barriers to participate in program There are no identifiable barriers or psychosocial needs.;The patient should benefit from training in stress management and relaxation.   Screening Interventions   Interventions Encouraged to exercise      Quality of Life Scores:     Quality of Life - 01/17/15 1227    Quality of Life Scores   Health/Function Pre 26.5 %   Socioeconomic Pre 30 %   Psych/Spiritual Pre 28.29 %   Family Pre 30 %   GLOBAL Pre 28.26 %      PHQ-9:     Recent Review Flowsheet Data    Depression screen [facility] 2/9 01/17/2015   Decreased Interest 0   Down, Depressed, Hopeless 0   PHQ - 2 Score 0   Altered sleeping 0   Tired, decreased energy 0   Change in appetite 1    Feeling bad or failure about yourself  0   Trouble concentrating 0   Moving slowly or fidgety/restless  0   Suicidal thoughts 0   PHQ-9 Score 1   Difficult doing work/chores Somewhat difficult      Psychosocial Evaluation and Intervention:   Psychosocial Re-Evaluation:   Vocational Rehabilitation: Provide vocational rehab assistance to qualifying candidates.   Vocational Rehab Evaluation & Intervention:     Vocational Rehab - 01/17/15 0849    Initial Vocational Rehab Evaluation & Intervention   Assessment shows need for Vocational Rehabilitation No      Education: Education Goals: Education classes will be provided on a weekly basis, covering required topics. Participant will state understanding/return demonstration of topics presented.  Learning Barriers/Preferences:     Learning Barriers/Preferences - 01/17/15 0849    Learning Barriers/Preferences   Learning Barriers None   Learning Preferences Written Material      Education  Topics: General Nutrition Guidelines/Fats and Fiber: -Group instruction provided by verbal, written material, models and posters to present the general guidelines for heart healthy nutrition. Gives an explanation and review of dietary fats and fiber.   Controlling Sodium/Reading Food Labels: -Group verbal and written material supporting the discussion of sodium use in heart healthy nutrition. Review and explanation with models, verbal and written materials for utilization of the food label.   Exercise Physiology & Risk Factors: - Group verbal and written instruction with models to review the exercise physiology of the cardiovascular system and associated critical values. Details cardiovascular disease risk factors and the goals associated with each risk factor.   Aerobic Exercise & Resistance Training: - Gives group verbal and written discussion on the health impact of inactivity. On the components of aerobic and resistive training programs and the benefits of this training and how to safely progress through these programs.   Flexibility, Balance, General Exercise Guidelines: - Provides group verbal and written instruction on the benefits of flexibility and balance training programs. Provides general exercise guidelines with specific guidelines to those with heart or lung disease. Demonstration and skill practice provided.   Stress Management: - Provides group verbal and written instruction about the health risks of elevated stress, cause of high stress, and healthy ways to reduce stress.   Depression: - Provides group verbal and written instruction on the correlation between heart/lung disease and depressed mood, treatment options, and the stigmas associated with seeking treatment.   Anatomy & Physiology of the Heart: - Group verbal and written instruction and models provide basic cardiac anatomy and physiology, with the coronary electrical and arterial systems. Review of: AMI, Angina,  Valve disease, Heart Failure, Cardiac Arrhythmia, Pacemakers, and the ICD.   Cardiac Procedures: - Group verbal and written instruction and models to describe the testing methods done to diagnose heart disease. Reviews the outcomes of the test results. Describes the treatment choices: Medical Management, Angioplasty, or Coronary Bypass Surgery.   Cardiac Medications: - Group verbal and written instruction to review commonly prescribed medications for heart disease. Reviews the medication, class of the drug, and side effects. Includes the steps to properly store meds and maintain the prescription regimen.   Go Sex-Intimacy & Heart Disease, Get SMART - Goal Setting: - Group verbal and written instruction through game format to discuss heart disease and the return to sexual intimacy. Provides group verbal and written material to discuss and apply goal setting through the application of the S.M.A.R.T. Method.   Other Matters of the Heart: - Provides group verbal, written materials and models to describe Heart Failure, Angina, Valve Disease, and Diabetes in the realm of heart disease. Includes description of the  disease process and treatment options available to the cardiac patient.   Exercise & Equipment Safety: - Individual verbal instruction and demonstration of equipment use and safety with use of the equipment.          Cardiac Rehab from 01/17/2015 in [facility] Cardiac and Pulmonary Rehab   Date  01/17/15   Educator  Sb   Instruction Review Code  2- meets goals/outcomes      Infection Prevention: - Provides verbal and written material to individual with discussion of infection control including proper hand washing and proper equipment cleaning during exercise session.      Cardiac Rehab from 01/17/2015 in [facility] Cardiac and Pulmonary Rehab   Date  01/17/15   Educator  Sb   Instruction Review Code  2- meets goals/outcomes      Falls Prevention: - Provides verbal and written material  to individual with discussion of falls prevention and safety.      Cardiac Rehab from 01/17/2015 in [facility] Cardiac and Pulmonary Rehab   Date  01/17/15   Educator  SB   Instruction Review Code  2- meets goals/outcomes      Diabetes: - Individual verbal and written instruction to review signs/symptoms of diabetes, desired ranges of glucose level fasting, after meals and with exercise. Advice that pre and post exercise glucose checks will be done for 3 sessions at entry of program.    Knowledge Questionnaire Score:     Knowledge Questionnaire Score - 01/17/15 0849    Knowledge Questionnaire Score   Pre Score 23/28      Personal Goals and Risk Factors at Admission:     Personal Goals and Risk Factors at Admission - 01/17/15 0851    Personal Goals and Risk Factors on Admission    Weight Management Obesity;Yes   Intervention Learn and follow the exercise and diet guidelines while in the program. Utilize the nutrition and education classes to help gain knowledge of the diet and exercise expectations in the program   Intervention Provide weight management tools through evaluation completed by registered dietician and exercise physiologist.  Establish a goal weight with participant.   Admit Weight 193 lb (87.544 kg)   Goal Weight 140 lb (63.504 kg)   Increase Aerobic Exercise and Physical Activity Yes;Sedentary  Not able to get up from floor because of knees. Both knees have osteoarthritis and [facility] is waitnig to be set up for knee replacements.  Rheumatiod arthritis in other joints.  Feet hurt  constantly because of shifting of bones created by the arthritis..   Intervention While in program, learn and follow the exercise prescription taught. Start at a low level workload and increase workload after able to maintain previous level for 30 minutes. Increase time before increasing intensity.   Intervention Provide exercise education and an individualized exercise prescription that will provide  continued progressive overload as per policy without signs/symptoms of physical distress.   Quit Smoking --  Quit 20 + years ago   Hypertension Yes   Goal Participant will see blood pressure controlled within the values of 140/65mm/Hg or within value directed by their physician.   Intervention Provide nutrition & aerobic exercise along with prescribed medications to achieve BP 140/90 or less.   Lipids Yes   Goal Cholesterol controlled with medications as prescribed, with individualized exercise RX and with personalized nutrition plan. Value goals: LDL < 70mg , HDL > 40mg . Participant states understanding of desired cholesterol values and following prescriptions.   Intervention Provide nutrition & aerobic exercise along  with prescribed medications to achieve LDL 70mg , HDL >40mg .      Personal Goals and Risk Factors Review:    Personal Goals Discharge (Final Personal Goals and Risk Factors Review):    ITP Comments:     ITP Comments      01/17/15 0851 01/29/15 1204         ITP Comments Initial orientation and ITP. Continue with ITP Ready for 30 day review.  Continue with ITP         Comments: Waiting for start to program. Has attended orientation

## 2015-02-01 ENCOUNTER — Encounter: Payer: Medicare Other | Attending: Cardiovascular Disease

## 2015-02-01 DIAGNOSIS — Z9861 Coronary angioplasty status: Secondary | ICD-10-CM | POA: Insufficient documentation

## 2015-02-01 NOTE — Addendum Note (Signed)
Addended by: Lynford Humphrey on: 02/01/2015 10:53 AM   Modules accepted: Orders

## 2015-02-10 ENCOUNTER — Ambulatory Visit: Payer: Medicare Other | Admitting: Nurse Practitioner

## 2015-02-14 ENCOUNTER — Encounter: Payer: Self-pay | Admitting: *Deleted

## 2015-02-23 ENCOUNTER — Telehealth: Payer: Self-pay | Admitting: *Deleted

## 2015-02-23 NOTE — Progress Notes (Signed)
Cardiac Individual Treatment Plan  Patient Details  Name: Karen Cervantes MRN: [id number] Date of Birth: [birth year] Referring Provider:  Wellington Hampshire, MD  Initial Encounter Date: Date: 01/17/15  Visit Diagnosis: S/P PTCA (percutaneous transluminal coronary angioplasty) - Plan: CARDIAC REHAB 30 DAY REVIEW, CARDIAC REHAB 30 DAY REVIEW  Patient's Home Medications on Admission:  Current outpatient prescriptions:  .  aspirin EC 81 MG tablet, Take 1 tablet (81 mg total) by mouth daily., Disp: 30 tablet, Rfl: 0 .  atorvastatin (LIPITOR) 80 MG tablet, Take 80 mg by mouth every evening., Disp: , Rfl:  .  carvedilol (COREG) 12.5 MG tablet, Take 1 tablet (12.5 mg total) by mouth 2 (two) times daily with a meal., Disp: 30 tablet, Rfl: 0 .  clopidogrel (PLAVIX) 75 MG tablet, Take 1 tablet (75 mg total) by mouth daily., Disp: 30 tablet, Rfl: 0 .  cyclobenzaprine (FLEXERIL) 10 MG tablet, Take 10 mg by mouth at bedtime., Disp: , Rfl:  .  fenofibrate (TRICOR) 48 MG tablet, Take 1 tablet (48 mg total) by mouth daily., Disp: 30 tablet, Rfl: 6 .  FOLIC ACID PO, Take 1 tablet by mouth every morning. , Disp: , Rfl:  .  gabapentin (NEURONTIN) 300 MG capsule, Take 600 mg by mouth 2 (two) times daily. , Disp: , Rfl:  .  lisinopril (PRINIVIL,ZESTRIL) 10 MG tablet, Take 1 tablet (10 mg total) by mouth daily., Disp: 30 tablet, Rfl: 0 .  Methotrexate, Anti-Rheumatic, (METHOTREXATE, PF, [place]arez), Inject 0.4 mLs into the skin once a week. Once a week, Disp: , Rfl:  .  Multiple Vitamins-Minerals (MULTIVITAMIN PO), Take 1 tablet by mouth every morning., Disp: , Rfl:  .  nitroGLYCERIN (NITROSTAT) 0.4 MG SL tablet, Place 1 tablet (0.4 mg total) under the tongue every 5 (five) minutes as needed for chest pain., Disp: 25 tablet, Rfl: 3 .  omeprazole (PRILOSEC) 20 MG capsule, Take 20 mg by mouth 2 (two) times daily before a meal., Disp: , Rfl:  .  oxyCODONE-acetaminophen (PERCOCET) 10-325 MG tablet, Take 1 tablet by mouth every  6 (six) hours as needed for pain., Disp: , Rfl:  .  pantoprazole (PROTONIX) 40 MG tablet, Take 1 tablet (40 mg total) by mouth daily. (Patient not taking: Reported on 01/17/2015), Disp: 30 tablet, Rfl: 6 .  sucralfate (CARAFATE) 1 G tablet, Take 1 tablet (1 g total) by mouth 4 (four) times daily -  before meals and at bedtime. (Patient not taking: Reported on 01/02/2015), Disp: 60 tablet, Rfl: 0  Past Medical History: Past Medical History  Diagnosis Date  . CAD (coronary artery disease)     a. NSTEMI 04/2014; b. cardiac cath 05/30/2014: ost LAD 30%, mLAD 95% s/p PCI/DES, ost RCA 60%, pRCA 50%, dRCA 60%, EF >55% by echo;  c. 12/2014 NSTEMI/PCI: LM nl, LAD 30ost, patent mid stent, D1 small, D2 min dzs, D3 small, LCX min irregs, OM1 small, OM2 min irregs, OM3 70, RCA 60ost, 40p, 57m/32m/d, 70d (3.25x28 Xience Alpine DES), RPDA min irregs, EF 55-65%.  Marland Kitchen HLD (hyperlipidemia)   . Obesity   . RA (rheumatoid arthritis) ([place])   . Diastolic dysfunction     a. echo 04/2014: EF 123456, no WMA, diastolic dysfunction, no valvular abnormalities, normal RVSP;  b. 12/2014 EF 55-65% by LV gram.  . Hypertension     Tobacco Use: History  Smoking status  . Former Smoker  Smokeless tobacco  . Not on file    Labs: Recent Review [employer]  Labs for ITP Cardiac and Pulmonary Rehab Latest Ref Rng 05/27/2014 05/28/2014 01/02/2015   Cholestrol - - 324(H) -   LDLCALC - - SEE COMMENT -   HDL - - 36(L) -   Trlycerides - - 658(H) -   Hemoglobin A1c 4.0 - 6.0 % 7.3(H) - 6.3(H)       Exercise Target Goals: Date: 01/17/15  Exercise Program Goal: Individual exercise prescription set with THRR, safety & activity barriers. Participant demonstrates ability to understand and report RPE using BORG scale, to self-measure pulse accurately, and to acknowledge the importance of the exercise prescription.  Exercise Prescription Goal: Starting with aerobic activity 30 plus minutes a day, 3 days per week for initial  exercise prescription. Provide home exercise prescription and guidelines that participant acknowledges understanding prior to discharge.  Activity Barriers & Risk Stratification:     Activity Barriers & Risk Stratification - 01/17/15 0847    Activity Barriers & Risk Stratification   Activity Barriers Arthritis;Back Problems;Joint Problems  Has sciatia, gets injections for this. Feet hurt all the time, wearing shoes does help reduce the foot pain.  Arthritis , hands, shoulders, jaw, neck. Limited movement beacaue of the arthritis   Risk Stratification High      6 Minute Walk:     6 Minute Walk      01/17/15 1133       6 Minute Walk   Phase Initial     Distance 923 feet     Walk Time 5.25 minutes     Resting HR 56 bpm     Resting BP 152/84 mmHg     Max Ex. HR 97 bpm     Max Ex. BP 154/76 mmHg     RPE 13     Symptoms No        Initial Exercise Prescription:     Initial Exercise Prescription - 01/17/15 1100    Date of Initial Exercise Prescription   Date 01/17/15   Treadmill   MPH 1.3   Grade 0   Minutes 5  Use 5 minute intervals ie 2 x 5 min with rest inbetween   Bike   Level 0.2   Minutes 10   Recumbant Bike   Level 2   RPM 40   Watts 15   Minutes 10   NuStep   Level 2   Watts 20   Minutes 15   Arm Ergometer   Level 1   Watts 8   Minutes 10   Arm/Foot Ergometer   Level 4   Watts 15   Minutes 10   Cybex   Level 1   RPM 45   Minutes 10   Recumbant Elliptical   Level 1   RPM 40   Watts 10   Minutes 10   REL-XR   Level 2   Watts 30   Minutes 10   T5 Nustep   Level 1   Watts 10   Minutes 15   Biostep-RELP   Level 2   Watts 15   Minutes 15   Prescription Details   Frequency (times per week) 3   Duration Progress to 30 minutes of continuous aerobic without signs/symptoms of physical distress   Intensity   THRR REST +  30   Ratings of Perceived Exertion 11-15   Progression Continue progressive overload as per policy without  signs/symptoms or physical distress.   Resistance Training   Training Prescription Yes   Weight 2   Reps 10-15  Exercise Prescription Changes:     Exercise Prescription Changes      01/17/15 0900 01/24/15 0700 02/14/15 0700       Exercise Review   Progression  No  Has not started program yet, med review was 01/17/15 No  Has not started program yet, med review was 01/17/15     Response to Exercise   Blood Pressure (Admit) 152/84 mmHg       Blood Pressure (Exercise) 154/76 mmHg       Blood Pressure (Exit) 138/72 mmHg       Heart Rate (Admit) 54 bpm       Heart Rate (Exercise) 96 bpm       Heart Rate (Exit) 62 bpm       Rating of Perceived Exertion (Exercise) 13       Symptoms no cardiac symptoms       Comments HAD to stop for sciatica pain  which resolved iwth rest          Discharge Exercise Prescription (Final Exercise Prescription Changes):     Exercise Prescription Changes - 02/14/15 0700    Exercise Review   Progression No  Has not started program yet, med review was 01/17/15      Nutrition:  Target Goals: Understanding of nutrition guidelines, daily intake of sodium 1500mg , cholesterol 200mg , calories 30% from fat and 7% or less from saturated fats, daily to have 5 or more servings of fruits and vegetables.  Biometrics:     Pre Biometrics - 01/17/15 1133    Pre Biometrics   Height 5' 2.25" (1.581 m)   Weight 191 lb 3.2 oz (86.728 kg)   Waist Circumference 38 inches   Hip Circumference 48 inches   Waist to Hip Ratio 0.79 %   BMI (Calculated) 34.8       Nutrition Therapy Plan and Nutrition Goals:     Nutrition Therapy & Goals - 01/17/15 0851    Intervention Plan   Intervention Using nutrition plan and personal goals to gain a healthy nutrition lifestyle. Add exercise as prescribed.      Nutrition Discharge: Rate Your Plate Scores:   Nutrition Goals Re-Evaluation:   Psychosocial: Target Goals: Acknowledge presence or absence of  depression, maximize coping skills, provide positive support system. Participant is able to verbalize types and ability to use techniques and skills needed for reducing stress and depression.  Initial Review & Psychosocial Screening:     Initial Psych Review & Screening - 01/17/15 0906    Family Dynamics   Good Support System? Yes   Barriers   Psychosocial barriers to participate in program There are no identifiable barriers or psychosocial needs.;The patient should benefit from training in stress management and relaxation.   Screening Interventions   Interventions Encouraged to exercise      Quality of Life Scores:     Quality of Life - 01/17/15 1227    Quality of Life Scores   Health/Function Pre 26.5 %   Socioeconomic Pre 30 %   Psych/Spiritual Pre 28.29 %   Family Pre 30 %   GLOBAL Pre 28.26 %      PHQ-9:     Recent Review Flowsheet Data    Depression screen [facility] 2/9 01/17/2015   Decreased Interest 0   Down, Depressed, Hopeless 0   PHQ - 2 Score 0   Altered sleeping 0   Tired, decreased energy 0   Change in appetite 1    Feeling bad or failure about yourself  0   Trouble concentrating 0   Moving slowly or fidgety/restless 0   Suicidal thoughts 0   PHQ-9 Score 1   Difficult doing work/chores Somewhat difficult      Psychosocial Evaluation and Intervention:   Psychosocial Re-Evaluation:   Vocational Rehabilitation: Provide vocational rehab assistance to qualifying candidates.   Vocational Rehab Evaluation & Intervention:     Vocational Rehab - 01/17/15 0849    Initial Vocational Rehab Evaluation & Intervention   Assessment shows need for Vocational Rehabilitation No      Education: Education Goals: Education classes will be provided on a weekly basis, covering required topics. Participant will state understanding/return demonstration of topics presented.  Learning Barriers/Preferences:     Learning Barriers/Preferences - 01/17/15 0849     Learning Barriers/Preferences   Learning Barriers None   Learning Preferences Written Material      Education Topics: General Nutrition Guidelines/Fats and Fiber: -Group instruction provided by verbal, written material, models and posters to present the general guidelines for heart healthy nutrition. Gives an explanation and review of dietary fats and fiber.   Controlling Sodium/Reading Food Labels: -Group verbal and written material supporting the discussion of sodium use in heart healthy nutrition. Review and explanation with models, verbal and written materials for utilization of the food label.   Exercise Physiology & Risk Factors: - Group verbal and written instruction with models to review the exercise physiology of the cardiovascular system and associated critical values. Details cardiovascular disease risk factors and the goals associated with each risk factor.   Aerobic Exercise & Resistance Training: - Gives group verbal and written discussion on the health impact of inactivity. On the components of aerobic and resistive training programs and the benefits of this training and how to safely progress through these programs.   Flexibility, Balance, General Exercise Guidelines: - Provides group verbal and written instruction on the benefits of flexibility and balance training programs. Provides general exercise guidelines with specific guidelines to those with heart or lung disease. Demonstration and skill practice provided.   Stress Management: - Provides group verbal and written instruction about the health risks of elevated stress, cause of high stress, and healthy ways to reduce stress.   Depression: - Provides group verbal and written instruction on the correlation between heart/lung disease and depressed mood, treatment options, and the stigmas associated with seeking treatment.   Anatomy & Physiology of the Heart: - Group verbal and written instruction and models  provide basic cardiac anatomy and physiology, with the coronary electrical and arterial systems. Review of: AMI, Angina, Valve disease, Heart Failure, Cardiac Arrhythmia, Pacemakers, and the ICD.   Cardiac Procedures: - Group verbal and written instruction and models to describe the testing methods done to diagnose heart disease. Reviews the outcomes of the test results. Describes the treatment choices: Medical Management, Angioplasty, or Coronary Bypass Surgery.   Cardiac Medications: - Group verbal and written instruction to review commonly prescribed medications for heart disease. Reviews the medication, class of the drug, and side effects. Includes the steps to properly store meds and maintain the prescription regimen.   Go Sex-Intimacy & Heart Disease, Get SMART - Goal Setting: - Group verbal and written instruction through game format to discuss heart disease and the return to sexual intimacy. Provides group verbal and written material to discuss and apply goal setting through the application of the S.M.A.R.T. Method.   Other Matters of the Heart: - Provides group verbal, written materials and models to describe Heart Failure, Angina, Valve Disease,  and Diabetes in the realm of heart disease. Includes description of the disease process and treatment options available to the cardiac patient.   Exercise & Equipment Safety: - Individual verbal instruction and demonstration of equipment use and safety with use of the equipment.          Cardiac Rehab from 01/17/2015 in [facility] Cardiac and Pulmonary Rehab   Date  01/17/15   Educator  Sb   Instruction Review Code  2- meets goals/outcomes      Infection Prevention: - Provides verbal and written material to individual with discussion of infection control including proper hand washing and proper equipment cleaning during exercise session.      Cardiac Rehab from 01/17/2015 in [facility] Cardiac and Pulmonary Rehab   Date  01/17/15   Educator   Sb   Instruction Review Code  2- meets goals/outcomes      Falls Prevention: - Provides verbal and written material to individual with discussion of falls prevention and safety.      Cardiac Rehab from 01/17/2015 in [facility] Cardiac and Pulmonary Rehab   Date  01/17/15   Educator  SB   Instruction Review Code  2- meets goals/outcomes      Diabetes: - Individual verbal and written instruction to review signs/symptoms of diabetes, desired ranges of glucose level fasting, after meals and with exercise. Advice that pre and post exercise glucose checks will be done for 3 sessions at entry of program.    Knowledge Questionnaire Score:     Knowledge Questionnaire Score - 01/17/15 0849    Knowledge Questionnaire Score   Pre Score 23/28      Personal Goals and Risk Factors at Admission:     Personal Goals and Risk Factors at Admission - 01/17/15 0851    Personal Goals and Risk Factors on Admission    Weight Management Obesity;Yes   Intervention Learn and follow the exercise and diet guidelines while in the program. Utilize the nutrition and education classes to help gain knowledge of the diet and exercise expectations in the program   Intervention Provide weight management tools through evaluation completed by registered dietician and exercise physiologist.  Establish a goal weight with participant.   Admit Weight 193 lb (87.544 kg)   Goal Weight 140 lb (63.504 kg)   Increase Aerobic Exercise and Physical Activity Yes;Sedentary  Not able to get up from floor because of knees. Both knees have osteoarthritis and Lavana is waitnig to be set up for knee replacements.  Rheumatiod arthritis in other joints.  Feet hurt  constantly because of shifting of bones created by the arthritis..   Intervention While in program, learn and follow the exercise prescription taught. Start at a low level workload and increase workload after able to maintain previous level for 30 minutes. Increase time before  increasing intensity.   Intervention Provide exercise education and an individualized exercise prescription that will provide continued progressive overload as per policy without signs/symptoms of physical distress.   Quit Smoking --  Quit 20 + years ago   Hypertension Yes   Goal Participant will see blood pressure controlled within the values of 140/85mm/Hg or within value directed by their physician.   Intervention Provide nutrition & aerobic exercise along with prescribed medications to achieve BP 140/90 or less.   Lipids Yes   Goal Cholesterol controlled with medications as prescribed, with individualized exercise RX and with personalized nutrition plan. Value goals: LDL < 70mg , HDL > 40mg . Participant states understanding of desired cholesterol values  and following prescriptions.   Intervention Provide nutrition & aerobic exercise along with prescribed medications to achieve LDL 70mg , HDL >40mg .      Personal Goals and Risk Factors Review:    Personal Goals Discharge (Final Personal Goals and Risk Factors Review):    ITP Comments:     ITP Comments      01/17/15 0851 01/29/15 1204 02/23/15 1120       ITP Comments Initial orientation and ITP. Continue with ITP Ready for 30 day review.  Continue with ITP Ready for 30 day review. Continue with ITP.  Sakia ahs been absent this month becuse of car trouble and family health concners.  She plans to return next week        Comments:

## 2015-02-23 NOTE — Telephone Encounter (Signed)
Called to check on status to return to program. Has been absent family concerns, plans to return on Monday.

## 2015-02-27 ENCOUNTER — Encounter: Payer: Medicare Other | Admitting: *Deleted

## 2015-02-27 DIAGNOSIS — Z9861 Coronary angioplasty status: Secondary | ICD-10-CM

## 2015-02-27 NOTE — Progress Notes (Signed)
Daily Session Note  Patient Details  Name: Karen Cervantes MRN: [id number] Date of Birth: 06/29/1955 Referring Provider:  Arida, Muhammad A, MD  Encounter Date: 02/27/2015  Check In:     Session Check In - 02/27/15 0822    Check-In   Staff Present Renee MacMillan, MS, ACSM CEP, Exercise Physiologist;Susanne Bice, RN, BSN, CCRP;Kelly Hayes, BS, ACSM CEP, Exercise Physiologist   ER physicians immediately available to respond to emergencies See telemetry face sheet for immediately available ER MD   Medication changes reported     No   Fall or balance concerns reported    No   Warm-up and Cool-down Performed on first and last piece of equipment   VAD Patient? No   Pain Assessment   Currently in Pain? No/denies   Multiple Pain Sites No           Exercise Prescription Changes - 02/27/15 0800    Exercise Review   Progression No   Response to Exercise   Comments Today was the patient's first day of class. Initial exercise prescription was reviewed with patient and she was able to tolerate it without signs and symptoms. Equipment safety and class procedures were also reviewed with the patient.       Goals Met:  Exercise tolerated well Personal goals reviewed No report of cardiac concerns or symptoms Strength training completed today  Goals Unmet:  Not Applicable  Goals Comments: Today was the patient's first day of class. The patient's initial exercise prescription (based on the 6 min walk evaluation) was reviewed with the patient.    Dr. Mark Miller is Medical Director for [facility] and [facility]. 

## 2015-03-01 ENCOUNTER — Encounter: Payer: Medicare Other | Attending: Cardiovascular Disease

## 2015-03-01 DIAGNOSIS — Z9861 Coronary angioplasty status: Secondary | ICD-10-CM | POA: Diagnosis present

## 2015-03-01 NOTE — Progress Notes (Signed)
Daily Session Note  Patient Details  Name: Karen Cervantes MRN: [id number] Date of Birth: 1955-08-09 Referring Provider:  Wellington Hampshire, MD  Encounter Date: 03/01/2015  Check In:     Session Check In - 03/01/15 0910    Check-In   Staff Present Heath Lark, RN, BSN, CCRP;Renee Dillard Essex, MS, ACSM CEP, Exercise Physiologist;Tyjuan Demetro, BS, ACSM EP-C, Exercise Physiologist   ER physicians immediately available to respond to emergencies See telemetry face sheet for immediately available ER MD   Medication changes reported     No   Fall or balance concerns reported    No   Warm-up and Cool-down Performed on first and last piece of equipment   VAD Patient? No   Pain Assessment   Currently in Pain? No/denies         Goals Met:  Proper associated with RPD/PD & O2 Sat Exercise tolerated well No report of cardiac concerns or symptoms Strength training completed today  Goals Unmet:  Not Applicable  Goals Comments:   Dr. Emily Filbert is Medical Director for [facility] and [facility].

## 2015-03-01 NOTE — Addendum Note (Signed)
Addended by: Lynford Humphrey on: 03/01/2015 07:03 AM   Modules accepted: Orders

## 2015-03-03 ENCOUNTER — Encounter: Payer: Medicare Other | Admitting: *Deleted

## 2015-03-03 DIAGNOSIS — Z9861 Coronary angioplasty status: Secondary | ICD-10-CM | POA: Diagnosis not present

## 2015-03-03 NOTE — Progress Notes (Signed)
Daily Session Note  Patient Details  Name: IRANIA DURELL MRN: [id number] Date of Birth: 1955/04/07 Referring Provider:  Wellington Hampshire, MD  Encounter Date: 03/03/2015  Check In:     Session Check In - 03/03/15 0905    Check-In   Staff Present Heath Lark, RN, BSN, CCRP;Kimiko Common, RN, Drusilla Kanner, MS, ACSM CEP, Exercise Physiologist   ER physicians immediately available to respond to emergencies See telemetry face sheet for immediately available ER MD   Medication changes reported     No   Fall or balance concerns reported    No   Warm-up and Cool-down Performed on first and last piece of equipment   VAD Patient? No   Pain Assessment   Currently in Pain? No/denies         Goals Met:  Proper associated with RPD/PD & O2 Sat Exercise tolerated well  Goals Unmet:  Not Applicable  Goals Comments:    Dr. Emily Filbert is Medical Director for [place] [facility] and [facility].

## 2015-03-08 ENCOUNTER — Encounter: Payer: Medicare Other | Admitting: *Deleted

## 2015-03-08 DIAGNOSIS — Z9861 Coronary angioplasty status: Secondary | ICD-10-CM | POA: Diagnosis not present

## 2015-03-08 NOTE — Progress Notes (Signed)
Daily Session Note  Patient Details  Name: Karen Cervantes MRN: [id number] Date of Birth: [birth year] Referring Provider:  Denton Lank, MD  Encounter Date: 03/08/2015  Check In:     Session Check In - 03/08/15 0839    Check-In   Location [facility]-Cardiac & Pulmonary Rehab   Staff Present Candiss Norse, MS, ACSM CEP, Exercise Physiologist;Carroll Enterkin, RN, BSN;Susanne Bice, RN, BSN, CCRP   Supervising physician immediately available to respond to emergencies See telemetry face sheet for immediately available ER MD   Medication changes reported     No   Fall or balance concerns reported    No   Warm-up and Cool-down Performed on first and last piece of equipment   Resistance Training Performed Yes   VAD Patient? No   Pain Assessment   Currently in Pain? No/denies   Multiple Pain Sites No           Exercise Prescription Changes - 03/08/15 0800    Exercise Review   Progression Yes   Response to Exercise   Symptoms None   Comments Reviewed individualized exercise prescription and made increases per departmental policy. Exercise increases were discussed with the patient and they were able to perform the new work loads without issue (no signs or symptoms).    Duration Progress to 50 minutes of aerobic without signs/symptoms of physical distress   Intensity Rest + 30   Progression Continue progressive overload as per policy without signs/symptoms or physical distress.   Resistance Training   Training Prescription Yes   Weight 2   Reps 10-15   Treadmill   MPH 1.6   Grade 15   Minutes 0   NuStep   Level 4   Watts 40   Minutes 20      Goals Met:  Independence with exercise equipment Exercise tolerated well Personal goals reviewed No report of cardiac concerns or symptoms Strength training completed today  Goals Unmet:  Not Applicable  Goals Comments: Patient completed exercise prescription and all exercise goals during rehab session. The exercise was tolerated well  and the patient is progressing in the program.    Dr. Emily Filbert is Medical Director for [facility] and [facility].

## 2015-03-21 NOTE — Progress Notes (Signed)
Cardiac Individual Treatment Plan  Patient Details  Name: Karen Cervantes MRN: [id number] Date of Birth: Jun 03, 1955 Referring Provider:  Denton Lank, MD  Initial Encounter Date:    Visit Diagnosis: S/P PTCA (percutaneous transluminal coronary angioplasty)  Patient's Home Medications on Admission:  Current outpatient prescriptions:  .  aspirin EC 81 MG tablet, Take 1 tablet (81 mg total) by mouth daily., Disp: 30 tablet, Rfl: 0 .  atorvastatin (LIPITOR) 80 MG tablet, Take 80 mg by mouth every evening., Disp: , Rfl:  .  carvedilol (COREG) 12.5 MG tablet, Take 1 tablet (12.5 mg total) by mouth 2 (two) times daily with a meal., Disp: 30 tablet, Rfl: 0 .  clopidogrel (PLAVIX) 75 MG tablet, Take 1 tablet (75 mg total) by mouth daily., Disp: 30 tablet, Rfl: 0 .  cyclobenzaprine (FLEXERIL) 10 MG tablet, Take 10 mg by mouth at bedtime., Disp: , Rfl:  .  fenofibrate (TRICOR) 48 MG tablet, Take 1 tablet (48 mg total) by mouth daily., Disp: 30 tablet, Rfl: 6 .  FOLIC ACID PO, Take 1 tablet by mouth every morning. , Disp: , Rfl:  .  gabapentin (NEURONTIN) 300 MG capsule, Take 600 mg by mouth 2 (two) times daily. , Disp: , Rfl:  .  lisinopril (PRINIVIL,ZESTRIL) 10 MG tablet, Take 1 tablet (10 mg total) by mouth daily., Disp: 30 tablet, Rfl: 0 .  Methotrexate, Anti-Rheumatic, (METHOTREXATE, PF, [place]), Inject 0.4 mLs into the skin once a week. Once a week, Disp: , Rfl:  .  Multiple Vitamins-Minerals (MULTIVITAMIN PO), Take 1 tablet by mouth every morning., Disp: , Rfl:  .  nitroGLYCERIN (NITROSTAT) 0.4 MG SL tablet, Place 1 tablet (0.4 mg total) under the tongue every 5 (five) minutes as needed for chest pain., Disp: 25 tablet, Rfl: 3 .  omeprazole (PRILOSEC) 20 MG capsule, Take 20 mg by mouth 2 (two) times daily before a meal., Disp: , Rfl:  .  oxyCODONE-acetaminophen (PERCOCET) 10-325 MG tablet, Take 1 tablet by mouth every 6 (six) hours as needed for pain., Disp: , Rfl:  .  pantoprazole (PROTONIX) 40 MG  tablet, Take 1 tablet (40 mg total) by mouth daily. (Patient not taking: Reported on 01/17/2015), Disp: 30 tablet, Rfl: 6 .  sucralfate (CARAFATE) 1 G tablet, Take 1 tablet (1 g total) by mouth 4 (four) times daily -  before meals and at bedtime. (Patient not taking: Reported on 01/02/2015), Disp: 60 tablet, Rfl: 0  Past Medical History: Past Medical History  Diagnosis Date  . CAD (coronary artery disease)     a. NSTEMI 04/2014; b. cardiac cath 05/30/2014: ost LAD 30%, mLAD 95% s/p PCI/DES, ost RCA 60%, pRCA 50%, dRCA 60%, EF >55% by echo;  c. 12/2014 NSTEMI/PCI: LM nl, LAD 30ost, patent mid stent, D1 small, D2 min dzs, D3 small, LCX min irregs, OM1 small, OM2 min irregs, OM3 70, RCA 60ost, 40p, 52m69m/d, 70d (3.25x28 Xience Alpine DES), RPDA min irregs, EF 55-65%.  .Marland KitchenHLD (hyperlipidemia)   . Obesity   . [place]oid [place]   . Diastolic dysfunction     a. echo 04/2014: EF >>63% no WMA, diastolic dysfunction, no valvular abnormalities, normal RVSP;  b. 12/2014 EF 55-65% by LV gram.  . Hypertension     Tobacco Use: History  Smoking status  . Former Smoker  Smokeless tobacco  . Not on file    Labs: [facility] Latest Ref Rng 05/27/2014 05/28/2014 01/02/2015  Cholestrol - - 324(H) -   LDLCALC - - SEE COMMENT -   HDL - - 36(L) -   Trlycerides - - 658(H) -   Hemoglobin A1c 4.0 - 6.0 % 7.3(H) - 6.3(H)       Exercise Target Goals:    Exercise Program Goal: Individual exercise prescription set with THRR, safety & activity barriers. Participant demonstrates ability to understand and report RPE using BORG scale, to self-measure pulse accurately, and to acknowledge the importance of the exercise prescription.  Exercise Prescription Goal: Starting with aerobic activity 30 plus minutes a day, 3 days per week for initial exercise prescription. Provide home exercise prescription and guidelines that participant acknowledges  understanding prior to discharge.  Activity Barriers & Risk Stratification:     Activity Barriers & Cardiac Risk Stratification - 01/17/15 0847    Activity Barriers & Cardiac Risk Stratification   Activity Barriers Arthritis;Back Problems;Joint Problems  Has sciatia, gets injections for this. Feet hurt all the time, wearing shoes does help reduce the foot pain.  Arthritis , hands, shoulders, jaw, neck. Limited movement beacaue of the arthritis   Cardiac Risk Stratification High      6 Minute Walk:     6 Minute Walk      01/17/15 1133       6 Minute Walk   Phase Initial     Distance 923 feet     Walk Time 5.25 minutes     RPE 13     Symptoms No     Resting HR 56 bpm     Resting BP 152/84 mmHg     Max Ex. HR 97 bpm     Max Ex. BP 154/76 mmHg        Initial Exercise Prescription:     Initial Exercise Prescription - 01/17/15 1100    Date of Initial Exercise Prescription   Date 01/17/15   Treadmill   MPH 1.3   Grade 0   Minutes 5  Use 5 minute intervals ie 2 x 5 min with rest inbetween   Bike   Level 0.2   Minutes 10   Recumbant Bike   Level 2   RPM 40   Watts 15   Minutes 10   NuStep   Level 2   Watts 20   Minutes 15   Arm Ergometer   Level 1   Watts 8   Minutes 10   Arm/Foot Ergometer   Level 4   Watts 15   Minutes 10   Cybex   Level 1   RPM 45   Minutes 10   Recumbant Elliptical   Level 1   RPM 40   Watts 10   Minutes 10   REL-XR   Level 2   Watts 30   Minutes 10   T5 Nustep   Level 1   Watts 10   Minutes 15   Biostep-RELP   Level 2   Watts 15   Minutes 15   Prescription Details   Frequency (times per week) 3   Duration Progress to 30 minutes of continuous aerobic without signs/symptoms of physical distress   Intensity   THRR REST +  30   Ratings of Perceived Exertion 11-15   Progression Continue progressive overload as per policy without signs/symptoms or physical distress.   Resistance Training   Training Prescription Yes    Weight 2   Reps 10-15      Exercise Prescription Changes:     Exercise Prescription  Changes      01/17/15 0900 01/24/15 0700 02/14/15 0700 02/27/15 0800 03/01/15 0900   Exercise Review   Progression  No  Has not started program yet, med review was 01/17/15 No  Has not started program yet, med review was 01/17/15 No Yes   Response to Exercise   Blood Pressure (Admit) 152/84 mmHg       Blood Pressure (Exercise) 154/76 mmHg       Blood Pressure (Exit) 138/72 mmHg       Heart Rate (Admit) 54 bpm       Heart Rate (Exercise) 96 bpm       Heart Rate (Exit) 62 bpm       Rating of Perceived Exertion (Exercise) 13       Symptoms no cardiac symptoms       Comments HAD to stop for sciatica pain  which resolved iwth rest   Today was the patient's first day of class. Initial exercise prescription was reviewed with patient and she was able to tolerate it without signs and symptoms. Equipment safety and class procedures were also reviewed with the patient.     Treadmill   MPH     1.4   Grade     15   Minutes     0   NuStep   Level     3   Watts     35   Minutes     15     03/08/15 0800           Exercise Review   Progression Yes       Response to Exercise   Blood Pressure (Admit) 118/64 mmHg       Blood Pressure (Exercise) 130/62 mmHg       Blood Pressure (Exit) 94/56 mmHg       Heart Rate (Admit) 72 bpm       Heart Rate (Exercise) 83 bpm       Heart Rate (Exit) 71 bpm       Rating of Perceived Exertion (Exercise) 12       Symptoms None       Comments Reviewed individualized exercise prescription and made increases per departmental policy. Exercise increases were discussed with the patient and they were able to perform the new work loads without issue (no signs or symptoms).        Duration Progress to 50 minutes of aerobic without signs/symptoms of physical distress       Intensity Rest + 30       Progression Continue progressive overload as per policy without signs/symptoms or  physical distress.       Resistance Training   Training Prescription Yes       Weight 2       Reps 10-15       Treadmill   MPH 1.6       Grade 15       Minutes 0       NuStep   Level 4       Watts 40       Minutes 20          Discharge Exercise Prescription (Final Exercise Prescription Changes):     Exercise Prescription Changes - 03/08/15 0800    Exercise Review   Progression Yes   Response to Exercise   Blood Pressure (Admit) 118/64 mmHg   Blood Pressure (Exercise) 130/62 mmHg   Blood Pressure (Exit) 94/56 mmHg   Heart Rate (Admit)  72 bpm   Heart Rate (Exercise) 83 bpm   Heart Rate (Exit) 71 bpm   Rating of Perceived Exertion (Exercise) 12   Symptoms None   Comments Reviewed individualized exercise prescription and made increases per departmental policy. Exercise increases were discussed with the patient and they were able to perform the new work loads without issue (no signs or symptoms).    Duration Progress to 50 minutes of aerobic without signs/symptoms of physical distress   Intensity Rest + 30   Progression Continue progressive overload as per policy without signs/symptoms or physical distress.   Resistance Training   Training Prescription Yes   Weight 2   Reps 10-15   Treadmill   MPH 1.6   Grade 15   Minutes 0   NuStep   Level 4   Watts 40   Minutes 20      Nutrition:  Target Goals: Understanding of nutrition guidelines, daily intake of sodium <15104m, cholesterol <2061m calories 30% from fat and 7% or less from saturated fats, daily to have 5 or more servings of fruits and vegetables.  Biometrics:     Pre Biometrics - 01/17/15 1133    Pre Biometrics   Height 5' 2.25" (1.581 m)   Weight 191 lb 3.2 oz (86.728 kg)   Waist Circumference 38 inches   Hip Circumference 48 inches   Waist to Hip Ratio 0.79 %   BMI (Calculated) 34.8       Nutrition Therapy Plan and Nutrition Goals:     Nutrition Therapy & Goals - 01/17/15 0851    Intervention  Plan   Intervention Using nutrition plan and personal goals to gain a healthy nutrition lifestyle. Add exercise as prescribed.      Nutrition Discharge: Rate Your Plate Scores:   Nutrition Goals Re-Evaluation:   Psychosocial: Target Goals: Acknowledge presence or absence of depression, maximize coping skills, provide positive support system. Participant is able to verbalize types and ability to use techniques and skills needed for reducing stress and depression.  Initial Review & Psychosocial Screening:     Initial Psych Review & Screening - 01/17/15 0906    Family Dynamics   Good Support System? Yes   Barriers   Psychosocial barriers to participate in program There are no identifiable barriers or psychosocial needs.;The patient should benefit from training in stress management and relaxation.   Screening Interventions   Interventions Encouraged to exercise      Quality of Life Scores:     Quality of Life - 01/17/15 1227    Quality of Life Scores   Health/Function Pre 26.5 %   Socioeconomic Pre 30 %   Psych/Spiritual Pre 28.29 %   Family Pre 30 %   GLOBAL Pre 28.26 %      PHQ-9:     Recent Review Flowsheet Data    [facility]   Decreased Interest 0   Down, Depressed, Hopeless 0   PHQ - 2 Score 0   Altered sleeping 0   Tired, decreased energy 0   Change in appetite 1    Feeling bad or failure about yourself  0   Trouble concentrating 0   Moving slowly or fidgety/restless 0   Suicidal thoughts 0   PHQ-9 Score 1   Difficult doing work/chores Somewhat difficult      Psychosocial Evaluation and Intervention:     Psychosocial Evaluation - 02/27/15 0954    Psychosocial Evaluation & Interventions   Interventions Encouraged to exercise with the  program and follow exercise prescription;Relaxation education   Comments Counselor met with Karen Cervantes for initial psychosocial evaluation.  She is a 60 year old well-adjusted female who had (2)  stents inserted in April and December of this past year.  She has a strong support system with a spouse of 54 years and (40) adult daughters, several of which live close by.  She has several health issues with osteoarthritis and Rheumatoid Arthritis in addition to hr cardiac issues.  She reports only sleeping 4 hours per night and has tried several OTC aids to help with this unsuccessfully.  She states she has lost some weight as her appetite has decreased.  She denies a history of depression or anxiety or current symptoms.  Karen Cervantes is reportedly typically a positive person who has minimal stress in her life at this time.  Her goals for this program are weight loss, walk without as much pain and increased stamina.  Counselor recommended speaking with her doctor or pharmacist about sustained release  natural sleep aids to help with this issue.  Counselor will be following with Karen Cervantes in the future.     Continued Psychosocial Services Needed Yes  Counselor recommends Karen Cervantes speak with her doctor or pharmacist re: OTC extended release sleep aids as an option to address her limited and intermittent sleep.  She will also benefit from meeting with the dietician to address her weight loss goals.        Psychosocial Re-Evaluation:   Vocational Rehabilitation: Provide vocational rehab assistance to qualifying candidates.   Vocational Rehab Evaluation & Intervention:     Vocational Rehab - 01/17/15 0849    Initial Vocational Rehab Evaluation & Intervention   Assessment shows need for Vocational Rehabilitation No      Education: Education Goals: Education classes will be provided on a weekly basis, covering required topics. Participant will state understanding/return demonstration of topics presented.  Learning Barriers/Preferences:     Learning Barriers/Preferences - 01/17/15 0849    Learning Barriers/Preferences   Learning Barriers None   Learning Preferences Written Material       Education Topics: General Nutrition Guidelines/Fats and Fiber: -Group instruction provided by verbal, written material, models and posters to present the general guidelines for heart healthy nutrition. Gives an explanation and review of dietary fats and fiber.   Controlling Sodium/Reading Food Labels: -Group verbal and written material supporting the discussion of sodium use in heart healthy nutrition. Review and explanation with models, verbal and written materials for utilization of the food label.   Exercise Physiology & Risk Factors: - Group verbal and written instruction with models to review the exercise physiology of the cardiovascular system and associated critical values. Details cardiovascular disease risk factors and the goals associated with each risk factor.   Aerobic Exercise & Resistance Training: - Gives group verbal and written discussion on the health impact of inactivity. On the components of aerobic and resistive training programs and the benefits of this training and how to safely progress through these programs.   Flexibility, Balance, General Exercise Guidelines: - Provides group verbal and written instruction on the benefits of flexibility and balance training programs. Provides general exercise guidelines with specific guidelines to those with heart or lung disease. Demonstration and skill practice provided.   Stress Management: - Provides group verbal and written instruction about the health risks of elevated stress, cause of high stress, and healthy ways to reduce stress.   Depression: - Provides group verbal and written instruction on the  correlation between heart/lung disease and depressed mood, treatment options, and the stigmas associated with seeking treatment.   Anatomy & Physiology of the Heart: - Group verbal and written instruction and models provide basic cardiac anatomy and physiology, with the coronary electrical and arterial systems. Review  of: AMI, Angina, Valve disease, Heart Failure, Cardiac Arrhythmia, Pacemakers, and the ICD.   Cardiac Procedures: - Group verbal and written instruction and models to describe the testing methods done to diagnose heart disease. Reviews the outcomes of the test results. Describes the treatment choices: Medical Management, Angioplasty, or Coronary Bypass Surgery.   Cardiac Medications: - Group verbal and written instruction to review commonly prescribed medications for heart disease. Reviews the medication, class of the drug, and side effects. Includes the steps to properly store meds and maintain the prescription regimen.          Cardiac Rehab from 03/08/2015 in [facility] Cardiac and Pulmonary Rehab   Date  03/08/15   Educator  SB   Instruction Review Code  2- meets goals/outcomes      Go Sex-Intimacy & Heart Disease, Get SMART - Goal Setting: - Group verbal and written instruction through game format to discuss heart disease and the return to sexual intimacy. Provides group verbal and written material to discuss and apply goal setting through the application of the S.M.A.R.T. Method.   Other Matters of the Heart: - Provides group verbal, written materials and models to describe Heart Failure, Angina, Valve Disease, and Diabetes in the realm of heart disease. Includes description of the disease process and treatment options available to the cardiac patient.      Cardiac Rehab from 03/08/2015 in [facility] Cardiac and Pulmonary Rehab   Date  02/27/15   Educator  SB   Instruction Review Code  2- meets goals/outcomes      Exercise & Equipment Safety: - Individual verbal instruction and demonstration of equipment use and safety with use of the equipment.      Cardiac Rehab from 03/08/2015 in [facility] Cardiac and Pulmonary Rehab   Date  01/17/15   Educator  Sb   Instruction Review Code  2- meets goals/outcomes      Infection Prevention: - Provides verbal and written material to individual with  discussion of infection control including proper hand washing and proper equipment cleaning during exercise session.      Cardiac Rehab from 03/08/2015 in [facility] Cardiac and Pulmonary Rehab   Date  01/17/15   Educator  Sb   Instruction Review Code  2- meets goals/outcomes      Falls Prevention: - Provides verbal and written material to individual with discussion of falls prevention and safety.      Cardiac Rehab from 03/08/2015 in [facility] Cardiac and Pulmonary Rehab   Date  01/17/15   Educator  SB   Instruction Review Code  2- meets goals/outcomes      Diabetes: - Individual verbal and written instruction to review signs/symptoms of diabetes, desired ranges of glucose level fasting, after meals and with exercise. Advice that pre and Cervantes exercise glucose checks will be done for 3 sessions at entry of program.    Knowledge Questionnaire Score:     Knowledge Questionnaire Score - 01/17/15 0849    Knowledge Questionnaire Score   Pre Score 23/28      Personal Goals and Risk Factors at Admission:     Personal Goals and Risk Factors at Admission - 01/17/15 0851    Personal Goals and Risk Factors on Admission  Weight Management Obesity;Yes   Intervention Learn and follow the exercise and diet guidelines while in the program. Utilize the nutrition and education classes to help gain knowledge of the diet and exercise expectations in the program   Intervention Provide weight management tools through evaluation completed by registered dietician and exercise physiologist.  Establish a goal weight with participant.   Admit Weight 193 lb (87.544 kg)   Goal Weight 140 lb (63.504 kg)   Increase Aerobic Exercise and Physical Activity Yes;Sedentary  Not able to get up from floor because of knees. Both knees have osteoarthritis and Milaina is waitnig to be set up for knee replacements.  Rheumatiod arthritis in other joints.  Feet hurt  constantly because of shifting of bones created by the arthritis..    Intervention While in program, learn and follow the exercise prescription taught. Start at a low level workload and increase workload after able to maintain previous level for 30 minutes. Increase time before increasing intensity.   Intervention Provide exercise education and an individualized exercise prescription that will provide continued progressive overload as per policy without signs/symptoms of physical distress.   Quit Smoking --  Quit 20 + years ago   Hypertension Yes   Goal Participant will see blood pressure controlled within the values of 140/84m/Hg or within value directed by their physician.   Intervention Provide nutrition & aerobic exercise along with prescribed medications to achieve BP 140/90 or less.   Lipids Yes   Goal Cholesterol controlled with medications as prescribed, with individualized exercise RX and with personalized nutrition plan. Value goals: LDL < 731m HDL > 4057mParticipant states understanding of desired cholesterol values and following prescriptions.   Intervention Provide nutrition & aerobic exercise along with prescribed medications to achieve LDL <49m41mDL >40mg90m   Personal Goals and Risk Factors Review:      Goals and Risk Factor Review      03/13/15 1058           Increase Aerobic Exercise and Physical Activity   Goals Progress/Improvement seen  Yes       Comments Maryfrances is progressing well in the program and has already increased workloads on both the TM and NS. She said she is already noticing that her body is adjusting to the exercise and she can do more and more each time. Tya can successfully exercise for the entire 30-45 minutes of aerobic exericse and in order to continue progression, we will focus on increases in intensity. We will follow up with her in a few weeks about beginning interval training and further workload increases on the machines.           Personal Goals Discharge (Final Personal Goals and Risk Factors Review):       Goals and Risk Factor Review - 03/13/15 1058    Increase Aerobic Exercise and Physical Activity   Goals Progress/Improvement seen  Yes   Comments Tajanay is progressing well in the program and has already increased workloads on both the TM and NS. She said she is already noticing that her body is adjusting to the exercise and she can do more and more each time. Nathaly can successfully exercise for the entire 30-45 minutes of aerobic exericse and in order to continue progression, we will focus on increases in intensity. We will follow up with her in a few weeks about beginning interval training and further workload increases on the machines.       ITP Comments:  ITP Comments      01/17/15 0851 01/29/15 1204 02/23/15 1120 03/21/15 0741     ITP Comments Initial orientation and ITP. Continue with ITP Ready for 30 day review.  Continue with ITP Ready for 30 day review. Continue with ITP.  Anhar ahs been absent this month becuse of car trouble and family health concners.  She plans to return next week 30 day review.  Continue with ITP.  Kiyah is attending program. Sporadic attendance.        Comments:

## 2015-03-22 DIAGNOSIS — Z9861 Coronary angioplasty status: Secondary | ICD-10-CM | POA: Diagnosis not present

## 2015-03-22 NOTE — Progress Notes (Signed)
Daily Session Note  Patient Details  Name: Karen Cervantes MRN: [id number] Date of Birth: [birth year] Referring Provider:  Wellington Hampshire, MD  Encounter Date: 03/22/2015  Check In:     Session Check In - 03/22/15 0819    Check-In   Location [facility]-Cardiac & Pulmonary Rehab   Staff Present Candiss Norse, MS, ACSM CEP, Exercise Physiologist;Steven Way, BS, ACSM EP-C, Exercise Physiologist;Susanne Bice, RN, BSN, CCRP   Supervising physician immediately available to respond to emergencies See telemetry face sheet for immediately available ER MD   Medication changes reported     Yes   Comments See Medication list   Fall or balance concerns reported    No   Warm-up and Cool-down Performed on first and last piece of equipment   Resistance Training Performed Yes   VAD Patient? No   Pain Assessment   Currently in Pain? No/denies   Multiple Pain Sites No         Goals Met:  Independence with exercise equipment Exercise tolerated well Personal goals reviewed No report of cardiac concerns or symptoms Strength training completed today  Goals Unmet:  Not Applicable  Goals Comments: Patient completed exercise prescription and all exercise goals during rehab session. The exercise was tolerated well and the patient is progressing in the program.    Dr. Emily Filbert is Medical Director for [place] and [facility].

## 2015-03-29 ENCOUNTER — Encounter: Payer: Medicare Other | Attending: Cardiovascular Disease | Admitting: *Deleted

## 2015-03-29 DIAGNOSIS — Z9861 Coronary angioplasty status: Secondary | ICD-10-CM | POA: Insufficient documentation

## 2015-03-29 NOTE — Progress Notes (Signed)
Daily Session Note  Patient Details  Name: Karen Cervantes MRN: [id number] Date of Birth: 1955-12-22 Referring Provider:  Wellington Hampshire, MD  Encounter Date: 03/29/2015  Check In:     Session Check In - 03/29/15 0840    Check-In   Location [facility]   Staff Present Nyoka Cowden, RN;Susanne Bice, RN, BSN, CCRP;Steven Way, BS, ACSM EP-C, Exercise Physiologist   Supervising physician immediately available to respond to emergencies See telemetry face sheet for immediately available ER MD   Medication changes reported     No   Fall or balance concerns reported    No   Warm-up and Cool-down Performed on first and last piece of equipment   Resistance Training Performed No   VAD Patient? No   Pain Assessment   Currently in Pain? No/denies           Exercise Prescription Changes - 03/29/15 0800    Exercise Review   Progression Yes   Response to Exercise   Blood Pressure (Admit) --   Blood Pressure (Exercise) --   Blood Pressure (Exit) --   Heart Rate (Admit) --   Heart Rate (Exercise) --   Heart Rate (Exit) --   Rating of Perceived Exertion (Exercise) --   Symptoms --   Comments --   Duration --   Intensity --   Progression   Progression --   Treadmill   MPH 1.8   Grade 0   Minutes 20   REL-XR   Level 4   Watts 50   Minutes 20      Goals Met:  Exercise tolerated well No report of cardiac concerns or symptoms  Goals Unmet:  Not Applicable  Comments: Doing well with exercise prescription progression.    Dr. Emily Filbert is Medical Director for [place] and [facility].

## 2015-03-29 NOTE — Addendum Note (Signed)
Addended by: Lynford Humphrey on: 03/29/2015 06:52 AM   Modules accepted: Orders

## 2015-04-03 DIAGNOSIS — Z9861 Coronary angioplasty status: Secondary | ICD-10-CM

## 2015-04-03 NOTE — Progress Notes (Signed)
Daily Session Note  Patient Details  Name: Karen Cervantes MRN: [id number] Date of Birth: 02/28/1955 Referring Provider:  Wellington Hampshire, MD  Encounter Date: 04/03/2015  Check In:     Session Check In - 04/03/15 0830    Check-In   Location [facility]   Staff Present Heath Lark, RN, BSN, CCRP;Sayge Brienza, Sherril Cong, BS, ACSM CEP, Exercise Physiologist   Supervising physician immediately available to respond to emergencies See telemetry face sheet for immediately available ER MD   Medication changes reported     No   Fall or balance concerns reported    No   Warm-up and Cool-down Performed on first and last piece of equipment   Resistance Training Performed Yes   VAD Patient? No   Pain Assessment   Currently in Pain? No/denies         Goals Met:  Independence with exercise equipment Exercise tolerated well No report of cardiac concerns or symptoms  Goals Unmet:  Not Applicable  Comments: Patient completed exercise prescription and all exercise goals during rehab session. The exercise was tolerated well and the patient is progressing in the program.    Dr. Emily Filbert is Medical Director for [place] and [facility].

## 2015-04-10 ENCOUNTER — Encounter: Payer: Medicare Other | Admitting: *Deleted

## 2015-04-10 DIAGNOSIS — Z9861 Coronary angioplasty status: Secondary | ICD-10-CM

## 2015-04-10 NOTE — Progress Notes (Signed)
Daily Session Note  Patient Details  Name: Karen Cervantes MRN: [id number] Date of Birth: 25-Dec-1955 Referring Provider:  Denton Lank, MD  Encounter Date: 04/10/2015  Check In:     Session Check In - 04/10/15 0832    Check-In   Location [facility]   Staff Present Candiss Norse, MS, ACSM CEP, Exercise Physiologist;Littleton Haub Amedeo Plenty, BS, ACSM CEP, Exercise Physiologist;Susanne Bice, RN, BSN, CCRP   Supervising physician immediately available to respond to emergencies See telemetry face sheet for immediately available ER MD   Medication changes reported     No   Fall or balance concerns reported    No   Warm-up and Cool-down Performed on first and last piece of equipment   Resistance Training Performed No   VAD Patient? No   Pain Assessment   Currently in Pain? No/denies   Multiple Pain Sites No         Goals Met:  Independence with exercise equipment Exercise tolerated well No report of cardiac concerns or symptoms Strength training completed today  Goals Unmet:  Not Applicable  Comments: Patient completed exercise prescription and all exercise goals during rehab session. The exercise was tolerated well and the patient is progressing in the program.     Dr. Emily Filbert is Medical Director for [place] and [facility].

## 2015-04-12 ENCOUNTER — Encounter: Payer: Self-pay | Admitting: Emergency Medicine

## 2015-04-12 ENCOUNTER — Emergency Department
Admission: EM | Admit: 2015-04-12 | Discharge: 2015-04-12 | Disposition: A | Discharge status: Home / Self Care | Payer: Medicare Other | Attending: Student | Admitting: Student

## 2015-04-12 DIAGNOSIS — I10 Essential (primary) hypertension: Secondary | ICD-10-CM | POA: Insufficient documentation

## 2015-04-12 DIAGNOSIS — Z79899 Other long term (current) drug therapy: Secondary | ICD-10-CM | POA: Diagnosis not present

## 2015-04-12 DIAGNOSIS — Z87891 Personal history of nicotine dependence: Secondary | ICD-10-CM | POA: Diagnosis not present

## 2015-04-12 DIAGNOSIS — G8929 Other chronic pain: Secondary | ICD-10-CM

## 2015-04-12 DIAGNOSIS — E785 Hyperlipidemia, unspecified: Secondary | ICD-10-CM | POA: Insufficient documentation

## 2015-04-12 DIAGNOSIS — M069 Rheumatoid arthritis, unspecified: Secondary | ICD-10-CM | POA: Insufficient documentation

## 2015-04-12 DIAGNOSIS — E669 Obesity, unspecified: Secondary | ICD-10-CM | POA: Insufficient documentation

## 2015-04-12 DIAGNOSIS — Z7982 Long term (current) use of aspirin: Secondary | ICD-10-CM | POA: Insufficient documentation

## 2015-04-12 DIAGNOSIS — M79605 Pain in left leg: Secondary | ICD-10-CM | POA: Diagnosis present

## 2015-04-12 DIAGNOSIS — M5442 Lumbago with sciatica, left side: Secondary | ICD-10-CM | POA: Insufficient documentation

## 2015-04-12 DIAGNOSIS — I2510 Atherosclerotic heart disease of native coronary artery without angina pectoris: Secondary | ICD-10-CM | POA: Diagnosis not present

## 2015-04-12 MED ORDER — LIDOCAINE 5 % EX PTCH: 1.0000 | MEDICATED_PATCH | Freq: Two times a day (BID) | CUTANEOUS | Status: DC

## 2015-04-12 NOTE — Discharge Instructions (Signed)
Chronic Back Pain ° When back pain lasts longer than 3 months, it is called chronic back pain. People with chronic back pain often go through certain periods that are more intense (flare-ups).  °CAUSES °Chronic back pain can be caused by wear and tear (degeneration) on different structures in your back. These structures include: °· The bones of your spine (vertebrae) and the joints surrounding your spinal cord and nerve roots (facets). °· The strong, fibrous tissues that connect your vertebrae (ligaments). °Degeneration of these structures may result in pressure on your nerves. This can lead to constant pain. °HOME CARE INSTRUCTIONS °· Avoid bending, heavy lifting, prolonged sitting, and activities which make the problem worse. °· Take brief periods of rest throughout the day to reduce your pain. Lying down or standing usually is better than sitting while you are resting. °· Take over-the-counter or prescription medicines only as directed by your caregiver. °SEEK IMMEDIATE MEDICAL CARE IF:  °· You have weakness or numbness in one of your legs or feet. °· You have trouble controlling your bladder or bowels. °· You have nausea, vomiting, abdominal pain, shortness of breath, or fainting. °  °This information is not intended to replace advice given to you by your health care provider. Make sure you discuss any questions you have with your health care provider. °  °Document Released: 02/22/2004 Document Revised: 04/08/2011 Document Reviewed: 07/04/2014 °Elsevier Interactive Patient Education ©2016 Elsevier Inc. ° °Sciatica °Sciatica is pain, weakness, numbness, or tingling along the path of the sciatic nerve. The nerve starts in the lower back and runs down the back of each leg. The nerve controls the muscles in the lower leg and in the back of the knee, while also providing sensation to the back of the thigh, lower leg, and the sole of your foot. Sciatica is a symptom of another medical condition. For instance, nerve  damage or certain conditions, such as a herniated disk or bone spur on the spine, pinch or put pressure on the sciatic nerve. This causes the pain, weakness, or other sensations normally associated with sciatica. Generally, sciatica only affects one side of the body. °CAUSES  °· Herniated or slipped disc. °· Degenerative disk disease. °· A pain disorder involving the narrow muscle in the buttocks (piriformis syndrome). °· Pelvic injury or fracture. °· Pregnancy. °· Tumor (rare). °SYMPTOMS  °Symptoms can vary from mild to very severe. The symptoms usually travel from the low back to the buttocks and down the back of the leg. Symptoms can include: °· Mild tingling or dull aches in the lower back, leg, or hip. °· Numbness in the back of the calf or sole of the foot. °· Burning sensations in the lower back, leg, or hip. °· Sharp pains in the lower back, leg, or hip. °· Leg weakness. °· Severe back pain inhibiting movement. °These symptoms may get worse with coughing, sneezing, laughing, or prolonged sitting or standing. Also, being overweight may worsen symptoms. °DIAGNOSIS  °Your caregiver will perform a physical exam to look for common symptoms of sciatica. He or she may ask you to do certain movements or activities that would trigger sciatic nerve pain. Other tests may be performed to find the cause of the sciatica. These may include: °· Blood tests. °· X-rays. °· Imaging tests, such as an MRI or CT scan. °TREATMENT  °Treatment is directed at the cause of the sciatic pain. Sometimes, treatment is not necessary and the pain and discomfort goes away on its own. If treatment is needed, your   caregiver may suggest: °· Over-the-counter medicines to relieve pain. °· Prescription medicines, such as anti-inflammatory medicine, muscle relaxants, or narcotics. °· Applying heat or ice to the painful area. °· Steroid injections to lessen pain, irritation, and inflammation around the nerve. °· Reducing activity during periods of  pain. °· Exercising and stretching to strengthen your abdomen and improve flexibility of your spine. Your caregiver may suggest losing weight if the extra weight makes the back pain worse. °· Physical therapy. °· Surgery to eliminate what is pressing or pinching the nerve, such as a bone spur or part of a herniated disk. °HOME CARE INSTRUCTIONS  °· Only take over-the-counter or prescription medicines for pain or discomfort as directed by your caregiver. °· Apply ice to the affected area for 20 minutes, 3-4 times a day for the first 48-72 hours. Then try heat in the same way. °· Exercise, stretch, or perform your usual activities if these do not aggravate your pain. °· Attend physical therapy sessions as directed by your caregiver. °· Keep all follow-up appointments as directed by your caregiver. °· Do not wear high heels or shoes that do not provide proper support. °· Check your mattress to see if it is too soft. A firm mattress may lessen your pain and discomfort. °SEEK IMMEDIATE MEDICAL CARE IF:  °· You lose control of your bowel or bladder (incontinence). °· You have increasing weakness in the lower back, pelvis, buttocks, or legs. °· You have redness or swelling of your back. °· You have a burning sensation when you urinate. °· You have pain that gets worse when you lie down or awakens you at night. °· Your pain is worse than you have experienced in the past. °· Your pain is lasting longer than 4 weeks. °· You are suddenly losing weight without reason. °MAKE SURE YOU: °· Understand these instructions. °· Will watch your condition. °· Will get help right away if you are not doing well or get worse. °  °This information is not intended to replace advice given to you by your health care provider. Make sure you discuss any questions you have with your health care provider. °  °Document Released: 01/08/2001 Document Revised: 10/05/2014 Document Reviewed: 05/26/2011 °Elsevier Interactive Patient Education ©2016  Elsevier Inc. ° °

## 2015-04-12 NOTE — ED Provider Notes (Signed)
[facility] Emergency Department Provider Note  ____________________________________________  Time seen: Approximately 7:33 PM  I have reviewed the triage vital signs and the nursing notes.   HISTORY  Chief Complaint Leg Pain    HPI Karen Cervantes is a 60 y.o. female who presents emergency department complaining of chronic sciatica that has increased over the last 3 days. Patient states that she was seen at the [place] today and was advised that she could take Percocet, gabapentin, and muscle relaxer. Patient states that these are not effective in controlling her pain. Patient states that she is unable to take nonsteroidal anti-inflammatories as well as steroids due to other medical conditions in which she has been warned against these by her primary care and cardiologist. Patient denies any injury. She denies any bowel or bladder dysfunction. She denies any paresthesias or saddle anesthesia.   Past Medical History  Diagnosis Date  . CAD (coronary artery disease)     a. NSTEMI 04/2014; b. cardiac cath 05/30/2014: ost LAD 30%, mLAD 95% s/p PCI/DES, ost RCA 60%, pRCA 50%, dRCA 60%, EF >55% by echo;  c. 12/2014 NSTEMI/PCI: LM nl, LAD 30ost, patent mid stent, D1 small, D2 min dzs, D3 small, LCX min irregs, OM1 small, OM2 min irregs, OM3 70, RCA 60ost, 40p, 47m/60m/d, 70d (3.25x28 Xience Alpine DES), RPDA min irregs, EF 55-65%.  Marland Kitchen HLD (hyperlipidemia)   . Obesity   . RA (rheumatoid arthritis) ([place])   . Diastolic dysfunction     a. echo 04/2014: EF 123456, no WMA, diastolic dysfunction, no valvular abnormalities, normal RVSP;  b. 12/2014 EF 55-65% by LV gram.  . Hypertension     Patient Active Problem List   Diagnosis Date Noted  . S/P coronary artery stent placement   . Ischemic chest pain ([place])   . Essential hypertension 06/10/2014  . CAD (coronary artery disease)   . HLD (hyperlipidemia)   . Obesity   . RA (rheumatoid arthritis) ([place])   .  Diastolic dysfunction   . NSTEMI (non-ST elevated myocardial infarction) ([place]) 05/28/2014    Past Surgical History  Procedure Laterality Date  . Cardiac catheterization N/A 05/30/2014    Procedure: Left Heart Cath and Coronary Angiography;  Surgeon: Wellington Hampshire, MD;  Location: [place] [facility];  Service: Cardiovascular;  Laterality: N/A;  . Cardiac catheterization N/A 05/30/2014    Procedure: Coronary Stent Intervention;  Surgeon: Wellington Hampshire, MD;  Location: [place] [facility];  Service: Cardiovascular;  Laterality: N/A;  . Cardiac catheterization N/A 01/02/2015    Procedure: Left Heart Cath and Coronary Angiography;  Surgeon: Wellington Hampshire, MD;  Location: [place] [facility];  Service: Cardiovascular;  Laterality: N/A;  . Cardiac catheterization N/A 01/02/2015    Procedure: Coronary Stent Intervention;  Surgeon: Wellington Hampshire, MD;  Location: [place] [facility];  Service: Cardiovascular;  Laterality: N/A;    Current Outpatient Rx  Name  Route  Sig  Dispense  Refill  . aspirin EC 81 MG tablet   Oral   Take 1 tablet (81 mg total) by mouth daily.   30 tablet   0   . atorvastatin (LIPITOR) 80 MG tablet   Oral   Take 80 mg by mouth every evening.         . carvedilol (COREG) 12.5 MG tablet   Oral   Take 1 tablet (12.5 mg total) by mouth 2 (two) times daily with a meal.   30 tablet   0   .  clopidogrel (PLAVIX) 75 MG tablet   Oral   Take 1 tablet (75 mg total) by mouth daily.   30 tablet   0   . cyclobenzaprine (FLEXERIL) 10 MG tablet   Oral   Take 10 mg by mouth at bedtime.         Marland Kitchen ezetimibe (ZETIA) 10 MG tablet   Oral   Take 10 mg by mouth daily.         . fenofibrate (TRICOR) 48 MG tablet   Oral   Take 1 tablet (48 mg total) by mouth daily.   30 tablet   6   . FOLIC ACID PO   Oral   Take 1 tablet by mouth every morning.          . gabapentin (NEURONTIN) 300 MG capsule   Oral   Take 600 mg by mouth 2 (two) times daily.          Marland Kitchen  lidocaine (LIDODERM) 5 %   Transdermal   Place 1 patch onto the skin every 12 (twelve) hours. Remove & Discard patch within 12 hours or as directed by MD   10 patch   0   . lisinopril (PRINIVIL,ZESTRIL) 10 MG tablet   Oral   Take 1 tablet (10 mg total) by mouth daily.   30 tablet   0   . Methotrexate, Anti-Rheumatic, (METHOTREXATE, PF, [place])   Subcutaneous   Inject 0.4 mLs into the skin once a week. Once a week         . Multiple Vitamins-Minerals (MULTIVITAMIN PO)   Oral   Take 1 tablet by mouth every morning.         . nitroGLYCERIN (NITROSTAT) 0.4 MG SL tablet   Sublingual   Place 1 tablet (0.4 mg total) under the tongue every 5 (five) minutes as needed for chest pain.   25 tablet   3   . omeprazole (PRILOSEC) 20 MG capsule   Oral   Take 20 mg by mouth 2 (two) times daily before a meal.         . oxyCODONE-acetaminophen (PERCOCET) 10-325 MG tablet   Oral   Take 1 tablet by mouth every 6 (six) hours as needed for pain.         . pantoprazole (PROTONIX) 40 MG tablet   Oral   Take 1 tablet (40 mg total) by mouth daily. Patient not taking: Reported on 01/17/2015   30 tablet   6   . sucralfate (CARAFATE) 1 G tablet   Oral   Take 1 tablet (1 g total) by mouth 4 (four) times daily -  before meals and at bedtime. Patient not taking: Reported on 01/02/2015   60 tablet   0     Allergies Remicade  Family History  Problem Relation Age of Onset  . Heart attack Father   . Heart disease Father   . Heart Problems Brother   . Heart Problems Brother   . Heart Problems Brother     Social History Social History  Substance Use Topics  . Smoking status: Former Research scientist (life sciences)  . Smokeless tobacco: None  . Alcohol Use: No     Review of Systems  Constitutional: No fever/chills Cardiovascular: no chest pain. Respiratory: no cough. No SOB. Genitourinary: Negative for dysuria. No hematuria Musculoskeletal: Positive for chronic back pain with left-sided sciatica. Skin:  Negative for rash. Neurological: Negative for headaches, focal weakness or numbness. 10-point ROS otherwise negative.  ____________________________________________   PHYSICAL EXAM:  VITAL SIGNS: ED Triage Vitals  Enc Vitals Group     BP 04/12/15 1915 154/65 mmHg     Pulse Rate 04/12/15 1915 73     Resp 04/12/15 1915 16     Temp 04/12/15 1915 98.5 F (36.9 C)     Temp Source 04/12/15 1915 Oral     SpO2 04/12/15 1915 100 %     Weight --      Height --      Head Cir --      Peak Flow --      Pain Score 04/12/15 1915 10     Pain Loc --      Pain Edu? --      Excl. in [place]? --      Constitutional: Alert and oriented. Well appearing and in no acute distress. Cardiovascular: Normal rate, regular rhythm. Normal S1 and S2.  Good peripheral circulation. Respiratory: Normal respiratory effort without tachypnea or retractions. Lungs CTAB. Gastrointestinal: No CVA tenderness. Musculoskeletal: No visible deformity to inspection. Patient is tender to palpation in the lumbar region, lumbar paraspinal region, and left-sided sciatic notch. No point tenderness. No palpable abnormality. Positive straight leg raise left side. Dorsalis pedis pulses appreciated. Sensation intact. Neurologic:  Normal speech and language. No gross focal neurologic deficits are appreciated.  Skin:  Skin is warm, dry and intact. No rash noted. Psychiatric: Mood and affect are normal. Speech and behavior are normal. Patient exhibits appropriate insight and judgement.   ____________________________________________   LABS (all labs ordered are listed, but only abnormal results are displayed)  Labs Reviewed - No data to display ____________________________________________  EKG   ____________________________________________  RADIOLOGY   No results found.  ____________________________________________    PROCEDURES  Procedure(s) performed:       Medications - No data to  display   ____________________________________________   INITIAL IMPRESSION / ASSESSMENT AND PLAN / ED COURSE  Pertinent labs & imaging results that were available during my care of the patient were reviewed by me and considered in my medical decision making (see chart for details).  Patient's diagnosis is consistent with chronic lumbago with left-sided sciatica. Patient's exam is reassuring at this time and no imaging is ordered. Patient is unable to take anti-inflammatories and steroids. She is already on Percocet, gabapentin, and Flexeril.. Patient will be discharged home with prescriptions for Lidoderm patch. She is encouraged to call primary care and her cardiologist in the morning to discuss short-term use of either nonsteroidals or steroids for inflammation control along the sciatic nerve. Patient verbalizes understanding and compliance with this..Patient is given ED precautions to return to the ED for any worsening or new symptoms.     ____________________________________________  FINAL CLINICAL IMPRESSION(S) / ED DIAGNOSES  Final diagnoses:  Chronic midline low back pain with left-sided sciatica      NEW MEDICATIONS STARTED DURING THIS VISIT:  New Prescriptions   LIDOCAINE (LIDODERM) 5 %    Place 1 patch onto the skin every 12 (twelve) hours. Remove & Discard patch within 12 hours or as directed by MD        This chart was dictated using voice recognition software/Dragon. Despite best efforts to proofread, errors can occur which can change the meaning. Any change was purely unintentional.    Darletta Moll, PA-C 04/12/15 1953  Joanne Gavel, MD 04/12/15 2017

## 2015-04-12 NOTE — ED Notes (Addendum)
Pt presents to ED with c/o left lower back pain that radiates to left leg. Pt states she has Hx of back pain. Pt states she was seen today at [facility] clinic for the same but she was told to take her home meds percocet, gabapentin and one of muscle relaxer but nothing works. Pulse and sensation present at distal extremity.

## 2015-04-18 ENCOUNTER — Telehealth: Payer: Self-pay | Admitting: *Deleted

## 2015-04-18 NOTE — Telephone Encounter (Signed)
Karen Cervantes called to say her sciatic pain is causing problems and she will be out for several sessions.

## 2015-04-20 ENCOUNTER — Encounter: Payer: Self-pay | Admitting: *Deleted

## 2015-04-20 NOTE — Progress Notes (Signed)
Cardiac Individual Treatment Plan  Patient Details  Name: Karen Cervantes MRN: [id number] Date of Birth: 1964/06/28 Referring Provider:  No ref. provider found  Initial Encounter Date:       Cardiac Rehab from 01/17/2015 in [facility] Cardiac and Pulmonary Rehab   Date  01/17/15      Visit Diagnosis: No diagnosis found.  Patient's Home Medications on Admission:  Current outpatient prescriptions:  .  aspirin EC 81 MG tablet, Take 1 tablet (81 mg total) by mouth daily., Disp: 30 tablet, Rfl: 0 .  atorvastatin (LIPITOR) 80 MG tablet, Take 80 mg by mouth every evening., Disp: , Rfl:  .  carvedilol (COREG) 12.5 MG tablet, Take 1 tablet (12.5 mg total) by mouth 2 (two) times daily with a meal., Disp: 30 tablet, Rfl: 0 .  clopidogrel (PLAVIX) 75 MG tablet, Take 1 tablet (75 mg total) by mouth daily., Disp: 30 tablet, Rfl: 0 .  cyclobenzaprine (FLEXERIL) 10 MG tablet, Take 10 mg by mouth at bedtime., Disp: , Rfl:  .  ezetimibe (ZETIA) 10 MG tablet, Take 10 mg by mouth daily., Disp: , Rfl:  .  fenofibrate (TRICOR) 48 MG tablet, Take 1 tablet (48 mg total) by mouth daily., Disp: 30 tablet, Rfl: 6 .  FOLIC ACID PO, Take 1 tablet by mouth every morning. , Disp: , Rfl:  .  gabapentin (NEURONTIN) 300 MG capsule, Take 600 mg by mouth 2 (two) times daily. , Disp: , Rfl:  .  lidocaine (LIDODERM) 5 %, Place 1 patch onto the skin every 12 (twelve) hours. Remove & Discard patch within 12 hours or as directed by MD, Disp: 10 patch, Rfl: 0 .  lisinopril (PRINIVIL,ZESTRIL) 10 MG tablet, Take 1 tablet (10 mg total) by mouth daily., Disp: 30 tablet, Rfl: 0 .  Methotrexate, Anti-Rheumatic, (METHOTREXATE, PF, Ehrhardt), Inject 0.4 mLs into the skin once a week. Once a week, Disp: , Rfl:  .  Multiple Vitamins-Minerals (MULTIVITAMIN PO), Take 1 tablet by mouth every morning., Disp: , Rfl:  .  nitroGLYCERIN (NITROSTAT) 0.4 MG SL tablet, Place 1 tablet (0.4 mg total) under the tongue every 5 (five) minutes as needed for chest  pain., Disp: 25 tablet, Rfl: 3 .  omeprazole (PRILOSEC) 20 MG capsule, Take 20 mg by mouth 2 (two) times daily before a meal., Disp: , Rfl:  .  oxyCODONE-acetaminophen (PERCOCET) 10-325 MG tablet, Take 1 tablet by mouth every 6 (six) hours as needed for pain., Disp: , Rfl:  .  pantoprazole (PROTONIX) 40 MG tablet, Take 1 tablet (40 mg total) by mouth daily. (Patient not taking: Reported on 01/17/2015), Disp: 30 tablet, Rfl: 6 .  sucralfate (CARAFATE) 1 G tablet, Take 1 tablet (1 g total) by mouth 4 (four) times daily -  before meals and at bedtime. (Patient not taking: Reported on 01/02/2015), Disp: 60 tablet, Rfl: 0  Past Medical History: Past Medical History  Diagnosis Date  . CAD (coronary artery disease)     a. NSTEMI 04/2014; b. cardiac cath 05/30/2014: ost LAD 30%, mLAD 95% s/p PCI/DES, ost RCA 60%, pRCA 50%, dRCA 60%, EF >55% by echo;  c. 12/2014 NSTEMI/PCI: LM nl, LAD 30ost, patent mid stent, D1 small, D2 min dzs, D3 small, LCX min irregs, OM1 small, OM2 min irregs, OM3 70, RCA 60ost, 40p, 42m4m/d, 70d (3.25x28 Xience Alpine DES), RPDA min irregs, EF 55-65%.  .Marland KitchenHLD (hyperlipidemia)   . Obesity   . [place]   . Diastolic dysfunction  a. echo 04/2014: EF >43%, no WMA, diastolic dysfunction, no valvular abnormalities, normal RVSP;  b. 12/2014 EF 55-65% by LV gram.  . Hypertension     Tobacco Use: History  Smoking status  . Former Smoker  Smokeless tobacco  . Not on file    Labs: Recent Review Flowsheet Data    Labs for ITP Cardiac and Pulmonary Rehab Latest Ref Rng 05/27/2014 05/28/2014 01/02/2015   Cholestrol - - 324(H) -   LDLCALC - - SEE COMMENT -   HDL - - 36(L) -   Trlycerides - - 658(H) -   Hemoglobin A1c 4.0 - 6.0 % 7.3(H) - 6.3(H)       Exercise Target Goals:    Exercise Program Goal: Individual exercise prescription set with THRR, safety & activity barriers. Participant demonstrates ability to understand and report RPE using BORG scale, to  self-measure pulse accurately, and to acknowledge the importance of the exercise prescription.  Exercise Prescription Goal: Starting with aerobic activity 30 plus minutes a day, 3 days per week for initial exercise prescription. Provide home exercise prescription and guidelines that participant acknowledges understanding prior to discharge.  Activity Barriers & Risk Stratification:     Activity Barriers & Cardiac Risk Stratification - 01/17/15 0847    Activity Barriers & Cardiac Risk Stratification   Activity Barriers Arthritis;Back Problems;Joint Problems  Has sciatia, gets injections for this. Feet hurt all the time, wearing shoes does help reduce the foot pain.  Arthritis , hands, shoulders, jaw, neck. Limited movement beacaue of the arthritis   Cardiac Risk Stratification High      6 Minute Walk:     6 Minute Walk      01/17/15 1133       6 Minute Walk   Phase Initial     Distance 923 feet     Walk Time 5.25 minutes     RPE 13     Symptoms No     Resting HR 56 bpm     Resting BP 152/84 mmHg     Max Ex. HR 97 bpm     Max Ex. BP 154/76 mmHg        Initial Exercise Prescription:     Initial Exercise Prescription - 01/17/15 1100    Date of Initial Exercise Prescription   Date 01/17/15   Treadmill   MPH 1.3   Grade 0   Minutes 5  Use 5 minute intervals ie 2 x 5 min with rest inbetween   Bike   Level 0.2   Minutes 10   Recumbant Bike   Level 2   RPM 40   Watts 15   Minutes 10   NuStep   Level 2   Watts 20   Minutes 15   Arm Ergometer   Level 1   Watts 8   Minutes 10   Arm/Foot Ergometer   Level 4   Watts 15   Minutes 10   Cybex   Level 1   RPM 45   Minutes 10   Recumbant Elliptical   Level 1   RPM 40   Watts 10   Minutes 10   REL-XR   Level 2   Watts 30   Minutes 10   T5 Nustep   Level 1   Watts 10   Minutes 15   Biostep-RELP   Level 2   Watts 15   Minutes 15   Prescription Details   Frequency (times per week) 3   Duration  Progress  to 30 minutes of continuous aerobic without signs/symptoms of physical distress   Intensity   THRR REST +  30   Ratings of Perceived Exertion 11-15   Progression   Progression Continue progressive overload as per policy without signs/symptoms or physical distress.   Resistance Training   Training Prescription Yes   Weight 2   Reps 10-15      Perform Capillary Blood Glucose checks as needed.  Exercise Prescription Changes:     Exercise Prescription Changes      01/17/15 0900 01/24/15 0700 02/14/15 0700 02/27/15 0800 03/01/15 0900   Exercise Review   Progression  No  Has not started program yet, med review was 01/17/15 No  Has not started program yet, med review was 01/17/15 No Yes   Response to Exercise   Blood Pressure (Admit) 152/84 mmHg       Blood Pressure (Exercise) 154/76 mmHg       Blood Pressure (Exit) 138/72 mmHg       Heart Rate (Admit) 54 bpm       Heart Rate (Exercise) 96 bpm       Heart Rate (Exit) 62 bpm       Rating of Perceived Exertion (Exercise) 13       Symptoms no cardiac symptoms       Comments HAD to stop for sciatica pain  which resolved iwth rest   Today was the patient's first day of class. Initial exercise prescription was reviewed with patient and she was able to tolerate it without signs and symptoms. Equipment safety and class procedures were also reviewed with the patient.     Treadmill   MPH (read-only)     1.4   Grade (read-only)     15   Minutes (read-only)     0   NuStep   Level (read-only)     3   Watts (read-only)     35   Minutes (read-only)     15     03/08/15 0800 03/29/15 0800 04/10/15 1100       Exercise Review   Progression Yes Yes Yes     Response to Exercise   Blood Pressure (Admit) 118/64 mmHg -- 124/68 mmHg     Blood Pressure (Exercise) 130/62 mmHg -- 152/68 mmHg     Blood Pressure (Exit) 94/56 mmHg -- 112/68 mmHg     Heart Rate (Admit) 72 bpm -- 75 bpm     Heart Rate (Exercise) 83 bpm -- 92 bpm     Heart Rate  (Exit) 71 bpm -- 71 bpm     Rating of Perceived Exertion (Exercise) 12 -- 13     Symptoms None -- None     Comments Reviewed individualized exercise prescription and made increases per departmental policy. Exercise increases were discussed with the patient and they were able to perform the new work loads without issue (no signs or symptoms).  -- Frida has increased in stamina and can now exercise for the entire 45 minutes of alloted aerobic exercise time. We will now focus on increases in level.      Duration Progress to 50 minutes of aerobic without signs/symptoms of physical distress -- Progress to 50 minutes of aerobic without signs/symptoms of physical distress     Intensity Rest + 30 -- Rest + 30     Progression   Progression Continue progressive overload as per policy without signs/symptoms or physical distress. -- Continue progressive overload as per policy without signs/symptoms or physical  distress.     Resistance Training   Training Prescription   Yes     Weight   2     Reps   10-15     Training Prescription (read-only) Yes       Weight (read-only) 2       Reps (read-only) 10-15       Treadmill   MPH  1.8 2     Grade  0 0     Minutes  20 20     MPH (read-only) 1.6       Grade (read-only) 15       Minutes (read-only) 0       NuStep   Level   4     Watts   40     Minutes   20     Level (read-only) 4       Watts (read-only) 40       Minutes (read-only) 20       REL-XR   Level  4 4     Watts  50 50     Minutes  20 20     Biostep-RELP   Level   5     Watts   35     Minutes   20        Exercise Comments:   Discharge Exercise Prescription (Final Exercise Prescription Changes):     Exercise Prescription Changes - 04/10/15 1100    Exercise Review   Progression Yes   Response to Exercise   Blood Pressure (Admit) 124/68 mmHg   Blood Pressure (Exercise) 152/68 mmHg   Blood Pressure (Exit) 112/68 mmHg   Heart Rate (Admit) 75 bpm   Heart Rate (Exercise) 92 bpm    Heart Rate (Exit) 71 bpm   Rating of Perceived Exertion (Exercise) 13   Symptoms None   Comments Vylet has increased in stamina and can now exercise for the entire 45 minutes of alloted aerobic exercise time. We will now focus on increases in level.    Duration Progress to 50 minutes of aerobic without signs/symptoms of physical distress   Intensity Rest + 30   Progression   Progression Continue progressive overload as per policy without signs/symptoms or physical distress.   Resistance Training   Training Prescription Yes   Weight 2   Reps 10-15   Treadmill   MPH 2   Grade 0   Minutes 20   NuStep   Level 4   Watts 40   Minutes 20   REL-XR   Level 4   Watts 50   Minutes 20   Biostep-RELP   Level 5   Watts 35   Minutes 20      Nutrition:  Target Goals: Understanding of nutrition guidelines, daily intake of sodium <1553m, cholesterol <2053m calories 30% from fat and 7% or less from saturated fats, daily to have 5 or more servings of fruits and vegetables.  Biometrics:     Pre Biometrics - 01/17/15 1133    Pre Biometrics   Height 5' 2.25" (1.581 m)   Weight 191 lb 3.2 oz (86.728 kg)   Waist Circumference 38 inches   Hip Circumference 48 inches   Waist to Hip Ratio 0.79 %   BMI (Calculated) 34.8       Nutrition Therapy Plan and Nutrition Goals:     Nutrition Therapy & Goals - 01/17/15 0851    Intervention Plan   Intervention (read-only) Using nutrition plan and personal goals to  gain a healthy nutrition lifestyle. Add exercise as prescribed.      Nutrition Discharge: Rate Your Plate Scores:   Nutrition Goals Re-Evaluation:   Psychosocial: Target Goals: Acknowledge presence or absence of depression, maximize coping skills, provide positive support system. Participant is able to verbalize types and ability to use techniques and skills needed for reducing stress and depression.  Initial Review & Psychosocial Screening:     Initial Psych Review &  Screening - 01/17/15 0906    Family Dynamics   Good Support System? Yes   Barriers   Psychosocial barriers to participate in program There are no identifiable barriers or psychosocial needs.;The patient should benefit from training in stress management and relaxation.   Screening Interventions   Interventions Encouraged to exercise      Quality of Life Scores:     Quality of Life - 01/17/15 1227    Quality of Life Scores   Health/Function Pre 26.5 %   Socioeconomic Pre 30 %   Psych/Spiritual Pre 28.29 %   Family Pre 30 %   GLOBAL Pre 28.26 %      PHQ-9:     Recent Review Flowsheet Data    Depression screen [facility] 2/9 01/17/2015   Decreased Interest 0   Down, Depressed, Hopeless 0   PHQ - 2 Score 0   Altered sleeping 0   Tired, decreased energy 0   Change in appetite 1    Feeling bad or failure about yourself  0   Trouble concentrating 0   Moving slowly or fidgety/restless 0   Suicidal thoughts 0   PHQ-9 Score 1   Difficult doing work/chores Somewhat difficult      Psychosocial Evaluation and Intervention:     Psychosocial Evaluation - 02/27/15 0954    Psychosocial Evaluation & Interventions   Interventions Encouraged to exercise with the program and follow exercise prescription;Relaxation education   Comments Counselor met with Ms. Eulas Post for initial psychosocial evaluation.  She is a 60 year old well-adjusted female who had (2) stents inserted in April and December of this past year.  She has a strong support system with a spouse of 58 years and (53) adult daughters, several of which live close by.  She has several health issues with osteoarthritis and Rheumatoid Arthritis in addition to hr cardiac issues.  She reports only sleeping 4 hours per night and has tried several OTC aids to help with this unsuccessfully.  She states she has lost some weight as her appetite has decreased.  She denies a history of depression or anxiety or current symptoms.  Ms. Armendarez is  reportedly typically a positive person who has minimal stress in her life at this time.  Her goals for this program are weight loss, walk without as much pain and increased stamina.  Counselor recommended speaking with her doctor or pharmacist about sustained release  natural sleep aids to help with this issue.  Counselor will be following with Ms. Boline in the future.     Continued Psychosocial Services Needed Yes  Counselor recommends Ms. Zinn speak with her doctor or pharmacist re: OTC extended release sleep aids as an option to address her limited and intermittent sleep.  She will also benefit from meeting with the dietician to address her weight loss goals.        Psychosocial Re-Evaluation:     Psychosocial Re-Evaluation      03/29/15 0947           Psychosocial Re-Evaluation   Comments  Follow up with Ms. Cacioppo stating she has begun the OTC sleep aid with some success initially.   Then she had a steroid infusion which has interrupted her sleep again.  She hopes to return to improved sleep once this wears off.  She continues to work out and stay active.  Counselor commended her on her commitment and progress.            Vocational Rehabilitation: Provide vocational rehab assistance to qualifying candidates.   Vocational Rehab Evaluation & Intervention:     Vocational Rehab - 01/17/15 0849    Initial Vocational Rehab Evaluation & Intervention   Assessment shows need for Vocational Rehabilitation No      Education: Education Goals: Education classes will be provided on a weekly basis, covering required topics. Participant will state understanding/return demonstration of topics presented.  Learning Barriers/Preferences:     Learning Barriers/Preferences - 01/17/15 0849    Learning Barriers/Preferences   Learning Barriers None   Learning Preferences Written Material      Education Topics: General Nutrition Guidelines/Fats and Fiber: -Group instruction provided by  verbal, written material, models and posters to present the general guidelines for heart healthy nutrition. Gives an explanation and review of dietary fats and fiber.   Controlling Sodium/Reading Food Labels: -Group verbal and written material supporting the discussion of sodium use in heart healthy nutrition. Review and explanation with models, verbal and written materials for utilization of the food label.   Exercise Physiology & Risk Factors: - Group verbal and written instruction with models to review the exercise physiology of the cardiovascular system and associated critical values. Details cardiovascular disease risk factors and the goals associated with each risk factor.   Aerobic Exercise & Resistance Training: - Gives group verbal and written discussion on the health impact of inactivity. On the components of aerobic and resistive training programs and the benefits of this training and how to safely progress through these programs.          Cardiac Rehab from 04/10/2015 in [facility] Cardiac and Pulmonary Rehab   Date  04/03/15   Educator  [facility]   Instruction Review Code  2- meets goals/outcomes      Flexibility, Balance, General Exercise Guidelines: - Provides group verbal and written instruction on the benefits of flexibility and balance training programs. Provides general exercise guidelines with specific guidelines to those with heart or lung disease. Demonstration and skill practice provided.      Cardiac Rehab from 04/10/2015 in [facility] Cardiac and Pulmonary Rehab   Date  04/03/15   Educator  [facility]   Instruction Review Code  2- meets goals/outcomes      Stress Management: - Provides group verbal and written instruction about the health risks of elevated stress, cause of high stress, and healthy ways to reduce stress.   Depression: - Provides group verbal and written instruction on the correlation between heart/lung disease and depressed mood, treatment options, and the stigmas  associated with seeking treatment.      Cardiac Rehab from 04/10/2015 in [facility] Cardiac and Pulmonary Rehab   Date  03/22/15   Educator  [facility]   Instruction Review Code  2- meets goals/outcomes      Anatomy & Physiology of the Heart: - Group verbal and written instruction and models provide basic cardiac anatomy and physiology, with the coronary electrical and arterial systems. Review of: AMI, Angina, Valve disease, Heart Failure, Cardiac Arrhythmia, Pacemakers, and the ICD.      Cardiac Rehab from 04/10/2015 in  [facility] Cardiac and Pulmonary Rehab   Date  04/10/15   Educator  SB   Instruction Review Code  2- meets goals/outcomes      Cardiac Procedures: - Group verbal and written instruction and models to describe the testing methods done to diagnose heart disease. Reviews the outcomes of the test results. Describes the treatment choices: Medical Management, Angioplasty, or Coronary Bypass Surgery.   Cardiac Medications: - Group verbal and written instruction to review commonly prescribed medications for heart disease. Reviews the medication, class of the drug, and side effects. Includes the steps to properly store meds and maintain the prescription regimen.      Cardiac Rehab from 04/10/2015 in [facility] Cardiac and Pulmonary Rehab   Date  03/08/15   Educator  SB   Instruction Review Code  2- meets goals/outcomes      Go Sex-Intimacy & Heart Disease, Get SMART - Goal Setting: - Group verbal and written instruction through game format to discuss heart disease and the return to sexual intimacy. Provides group verbal and written material to discuss and apply goal setting through the application of the S.M.A.R.T. Method.   Other Matters of the Heart: - Provides group verbal, written materials and models to describe Heart Failure, Angina, Valve Disease, and Diabetes in the realm of heart disease. Includes description of the disease process and treatment options available to the cardiac patient.       Cardiac Rehab from 04/10/2015 in [facility] Cardiac and Pulmonary Rehab   Date  02/27/15   Educator  SB   Instruction Review Code  2- meets goals/outcomes      Exercise & Equipment Safety: - Individual verbal instruction and demonstration of equipment use and safety with use of the equipment.      Cardiac Rehab from 04/10/2015 in [facility] Cardiac and Pulmonary Rehab   Date  01/17/15   Educator  Sb   Instruction Review Code  2- meets goals/outcomes      Infection Prevention: - Provides verbal and written material to individual with discussion of infection control including proper hand washing and proper equipment cleaning during exercise session.      Cardiac Rehab from 04/10/2015 in [facility] Cardiac and Pulmonary Rehab   Date  01/17/15   Educator  Sb   Instruction Review Code  2- meets goals/outcomes      Falls Prevention: - Provides verbal and written material to individual with discussion of falls prevention and safety.      Cardiac Rehab from 04/10/2015 in [facility] Cardiac and Pulmonary Rehab   Date  01/17/15   Educator  SB   Instruction Review Code  2- meets goals/outcomes      Diabetes: - Individual verbal and written instruction to review signs/symptoms of diabetes, desired ranges of glucose level fasting, after meals and with exercise. Advice that pre and post exercise glucose checks will be done for 3 sessions at entry of program.    Knowledge Questionnaire Score:     Knowledge Questionnaire Score - 01/17/15 0849    Knowledge Questionnaire Score   Pre Score 23/28      Personal Goals and Risk Factors at Admission:     Personal Goals and Risk Factors at Admission - 01/17/15 0851    Core Components/Risk Factors/Patient Goals on Admission    Weight Management Obesity;Yes   Intervention (read-only) Learn and follow the exercise and diet guidelines while in the program. Utilize the nutrition and education classes to help gain knowledge of the diet and exercise expectations in  the  program   Intervention (read-only) Provide weight management tools through evaluation completed by registered dietician and exercise physiologist.  Establish a goal weight with participant.   Admit Weight 193 lb (87.544 kg)   Goal Weight: Short Term 140 lb (63.504 kg)   Sedentary Yes;Sedentary  Not able to get up from floor because of knees. Both knees have osteoarthritis and Marieclaire is waitnig to be set up for knee replacements.  Rheumatiod arthritis in other joints.  Feet hurt  constantly because of shifting of bones created by the arthritis..   Intervention (read-only) While in program, learn and follow the exercise prescription taught. Start at a low level workload and increase workload after able to maintain previous level for 30 minutes. Increase time before increasing intensity.   Intervention (read-only) Provide exercise education and an individualized exercise prescription that will provide continued progressive overload as per policy without signs/symptoms of physical distress.   Tobacco Cessation --  Quit 20 + years ago   Hypertension Yes   Goal Participant will see blood pressure controlled within the values of 140/44m/Hg or within value directed by their physician.   Intervention (read-only) Provide nutrition & aerobic exercise along with prescribed medications to achieve BP 140/90 or less.   Lipids Yes   Goal Cholesterol controlled with medications as prescribed, with individualized exercise RX and with personalized nutrition plan. Value goals: LDL < 720m HDL > 401mParticipant states understanding of desired cholesterol values and following prescriptions.   Intervention (read-only) Provide nutrition & aerobic exercise along with prescribed medications to achieve LDL <57m65mDL >40mg24m   Personal Goals and Risk Factors Review:      Goals and Risk Factor Review      03/13/15 1058 04/20/15 1000         Core Components/Risk Factors/Patient Goals Review   Personal Goals  Review Increase Aerobic Exercise and Physical Activity Weight Management/Obesity;Hypertension;Lipids      Review  Gentri ahs had sporadic visits for varied reasons. When she is here in class she does not report cardiac symptoms. Her weight remains steady and her BP readings are in good range and well controlled.       Expected Outcomes  Continue to follow exercise and nutriotn guidelines to maintian control of risk factors and to work on weight loss. Expected goal for weight loss is 2-5 pounds while in the program.      Increase Aerobic Exercise and Physical Activity (read-only)   Goals Progress/Improvement seen  Yes       Comments Courtnay is progressing well in the program and has already increased workloads on both the TM and NS. She said she is already noticing that her body is adjusting to the exercise and she can do more and more each time. Sarahjane can successfully exercise for the entire 30-45 minutes of aerobic exericse and in order to continue progression, we will focus on increases in intensity. We will follow up with her in a few weeks about beginning interval training and further workload increases on the machines.           Personal Goals Discharge (Final Personal Goals and Risk Factors Review):      Goals and Risk Factor Review - 04/20/15 1000    Core Components/Risk Factors/Patient Goals Review   Personal Goals Review Weight Management/Obesity;Hypertension;Lipids   Review Ronisha ahs had sporadic visits for varied reasons. When she is here in class she does not report cardiac symptoms. Her weight remains steady and  her BP readings are in good range and well controlled.    Expected Outcomes Continue to follow exercise and nutriotn guidelines to maintian control of risk factors and to work on weight loss. Expected goal for weight loss is 2-5 pounds while in the program.      ITP Comments:     ITP Comments      01/17/15 0851 01/29/15 1204 02/23/15 1120 03/21/15 0741 04/20/15 0959   ITP  Comments Initial orientation and ITP. Continue with ITP Ready for 30 day review.  Continue with ITP Ready for 30 day review. Continue with ITP.  Jalani ahs been absent this month becuse of car trouble and family health concners.  She plans to return next week 30 day review.  Continue with ITP.  Aradhya is attending program. Sporadic attendance.  30 day review  Continue with ITP  Lindzee is out this week because of back pain.      Comments:

## 2015-04-26 ENCOUNTER — Encounter: Payer: Self-pay | Admitting: *Deleted

## 2015-04-26 NOTE — Progress Notes (Signed)
Cardiac Individual Treatment Plan  Patient Details  Name: Karen Cervantes MRN: [id number] Date of Birth: 1955/06/29 Referring Provider:  No ref. provider found  Initial Encounter Date:       Cardiac Rehab from 01/17/2015 in [facility] Cardiac and Pulmonary Rehab   Date  01/17/15      Visit Diagnosis: No diagnosis found.  Patient's Home Medications on Admission:  Current outpatient prescriptions:  .  aspirin EC 81 MG tablet, Take 1 tablet (81 mg total) by mouth daily., Disp: 30 tablet, Rfl: 0 .  atorvastatin (LIPITOR) 80 MG tablet, Take 80 mg by mouth every evening., Disp: , Rfl:  .  carvedilol (COREG) 12.5 MG tablet, Take 1 tablet (12.5 mg total) by mouth 2 (two) times daily with a meal., Disp: 30 tablet, Rfl: 0 .  clopidogrel (PLAVIX) 75 MG tablet, Take 1 tablet (75 mg total) by mouth daily., Disp: 30 tablet, Rfl: 0 .  cyclobenzaprine (FLEXERIL) 10 MG tablet, Take 10 mg by mouth at bedtime., Disp: , Rfl:  .  ezetimibe (ZETIA) 10 MG tablet, Take 10 mg by mouth daily., Disp: , Rfl:  .  fenofibrate (TRICOR) 48 MG tablet, Take 1 tablet (48 mg total) by mouth daily., Disp: 30 tablet, Rfl: 6 .  FOLIC ACID PO, Take 1 tablet by mouth every morning. , Disp: , Rfl:  .  gabapentin (NEURONTIN) 300 MG capsule, Take 600 mg by mouth 2 (two) times daily. , Disp: , Rfl:  .  lidocaine (LIDODERM) 5 %, Place 1 patch onto the skin every 12 (twelve) hours. Remove & Discard patch within 12 hours or as directed by MD, Disp: 10 patch, Rfl: 0 .  lisinopril (PRINIVIL,ZESTRIL) 10 MG tablet, Take 1 tablet (10 mg total) by mouth daily., Disp: 30 tablet, Rfl: 0 .  Methotrexate, Anti-Rheumatic, (METHOTREXATE, PF, [place]), Inject 0.4 mLs into the skin once a week. Once a week, Disp: , Rfl:  .  Multiple Vitamins-Minerals (MULTIVITAMIN PO), Take 1 tablet by mouth every morning., Disp: , Rfl:  .  nitroGLYCERIN (NITROSTAT) 0.4 MG SL tablet, Place 1 tablet (0.4 mg total) under the tongue every 5 (five) minutes as needed for chest  pain., Disp: 25 tablet, Rfl: 3 .  omeprazole (PRILOSEC) 20 MG capsule, Take 20 mg by mouth 2 (two) times daily before a meal., Disp: , Rfl:  .  oxyCODONE-acetaminophen (PERCOCET) 10-325 MG tablet, Take 1 tablet by mouth every 6 (six) hours as needed for pain., Disp: , Rfl:  .  pantoprazole (PROTONIX) 40 MG tablet, Take 1 tablet (40 mg total) by mouth daily. (Patient not taking: Reported on 01/17/2015), Disp: 30 tablet, Rfl: 6 .  sucralfate (CARAFATE) 1 G tablet, Take 1 tablet (1 g total) by mouth 4 (four) times daily -  before meals and at bedtime. (Patient not taking: Reported on 01/02/2015), Disp: 60 tablet, Rfl: 0  Past Medical History: Past Medical History  Diagnosis Date  . CAD (coronary artery disease)     a. NSTEMI 04/2014; b. cardiac cath 05/30/2014: ost LAD 30%, mLAD 95% s/p PCI/DES, ost RCA 60%, pRCA 50%, dRCA 60%, EF >55% by echo;  c. 12/2014 NSTEMI/PCI: LM nl, LAD 30ost, patent mid stent, D1 small, D2 min dzs, D3 small, LCX min irregs, OM1 small, OM2 min irregs, OM3 70, RCA 60ost, 40p, 42m4m/d, 70d (3.25x28 Xience Alpine DES), RPDA min irregs, EF 55-65%.  .Marland KitchenHLD (hyperlipidemia)   . Obesity   . [place]   . Diastolic dysfunction  a. echo 04/2014: EF >43%, no WMA, diastolic dysfunction, no valvular abnormalities, normal RVSP;  b. 12/2014 EF 55-65% by LV gram.  . Hypertension     Tobacco Use: History  Smoking status  . Former Smoker  Smokeless tobacco  . Not on file    Labs: Recent Review Flowsheet Data    Labs for ITP Cardiac and Pulmonary Rehab Latest Ref Rng 05/27/2014 05/28/2014 01/02/2015   Cholestrol - - 324(H) -   LDLCALC - - SEE COMMENT -   HDL - - 36(L) -   Trlycerides - - 658(H) -   Hemoglobin A1c 4.0 - 6.0 % 7.3(H) - 6.3(H)       Exercise Target Goals:    Exercise Program Goal: Individual exercise prescription set with THRR, safety & activity barriers. Participant demonstrates ability to understand and report RPE using BORG scale, to  self-measure pulse accurately, and to acknowledge the importance of the exercise prescription.  Exercise Prescription Goal: Starting with aerobic activity 30 plus minutes a day, 3 days per week for initial exercise prescription. Provide home exercise prescription and guidelines that participant acknowledges understanding prior to discharge.  Activity Barriers & Risk Stratification:     Activity Barriers & Cardiac Risk Stratification - 01/17/15 0847    Activity Barriers & Cardiac Risk Stratification   Activity Barriers Arthritis;Back Problems;Joint Problems  Has sciatia, gets injections for this. Feet hurt all the time, wearing shoes does help reduce the foot pain.  Arthritis , hands, shoulders, jaw, neck. Limited movement beacaue of the arthritis   Cardiac Risk Stratification High      6 Minute Walk:     6 Minute Walk      01/17/15 1133       6 Minute Walk   Phase Initial     Distance 923 feet     Walk Time 5.25 minutes     RPE 13     Symptoms No     Resting HR 56 bpm     Resting BP 152/84 mmHg     Max Ex. HR 97 bpm     Max Ex. BP 154/76 mmHg        Initial Exercise Prescription:     Initial Exercise Prescription - 01/17/15 1100    Date of Initial Exercise Prescription   Date 01/17/15   Treadmill   MPH 1.3   Grade 0   Minutes 5  Use 5 minute intervals ie 2 x 5 min with rest inbetween   Bike   Level 0.2   Minutes 10   Recumbant Bike   Level 2   RPM 40   Watts 15   Minutes 10   NuStep   Level 2   Watts 20   Minutes 15   Arm Ergometer   Level 1   Watts 8   Minutes 10   Arm/Foot Ergometer   Level 4   Watts 15   Minutes 10   Cybex   Level 1   RPM 45   Minutes 10   Recumbant Elliptical   Level 1   RPM 40   Watts 10   Minutes 10   REL-XR   Level 2   Watts 30   Minutes 10   T5 Nustep   Level 1   Watts 10   Minutes 15   Biostep-RELP   Level 2   Watts 15   Minutes 15   Prescription Details   Frequency (times per week) 3   Duration  Progress  to 30 minutes of continuous aerobic without signs/symptoms of physical distress   Intensity   THRR REST +  30   Ratings of Perceived Exertion 11-15   Progression   Progression Continue progressive overload as per policy without signs/symptoms or physical distress.   Resistance Training   Training Prescription Yes   Weight 2   Reps 10-15      Perform Capillary Blood Glucose checks as needed.  Exercise Prescription Changes:     Exercise Prescription Changes      01/17/15 0900 01/24/15 0700 02/14/15 0700 02/27/15 0800 03/01/15 0900   Exercise Review   Progression  No  Has not started program yet, med review was 01/17/15 No  Has not started program yet, med review was 01/17/15 No Yes   Response to Exercise   Blood Pressure (Admit) 152/84 mmHg       Blood Pressure (Exercise) 154/76 mmHg       Blood Pressure (Exit) 138/72 mmHg       Heart Rate (Admit) 54 bpm       Heart Rate (Exercise) 96 bpm       Heart Rate (Exit) 62 bpm       Rating of Perceived Exertion (Exercise) 13       Symptoms no cardiac symptoms       Comments HAD to stop for sciatica pain  which resolved iwth rest   Today was the patient's first day of class. Initial exercise prescription was reviewed with patient and she was able to tolerate it without signs and symptoms. Equipment safety and class procedures were also reviewed with the patient.     Treadmill   MPH (read-only)     1.4   Grade (read-only)     15   Minutes (read-only)     0   NuStep   Level (read-only)     3   Watts (read-only)     35   Minutes (read-only)     15     03/08/15 0800 03/29/15 0800 04/10/15 1100       Exercise Review   Progression Yes Yes Yes     Response to Exercise   Blood Pressure (Admit) 118/64 mmHg -- 124/68 mmHg     Blood Pressure (Exercise) 130/62 mmHg -- 152/68 mmHg     Blood Pressure (Exit) 94/56 mmHg -- 112/68 mmHg     Heart Rate (Admit) 72 bpm -- 75 bpm     Heart Rate (Exercise) 83 bpm -- 92 bpm     Heart Rate  (Exit) 71 bpm -- 71 bpm     Rating of Perceived Exertion (Exercise) 12 -- 13     Symptoms None -- None     Comments Reviewed individualized exercise prescription and made increases per departmental policy. Exercise increases were discussed with the patient and they were able to perform the new work loads without issue (no signs or symptoms).  -- Karen Cervantes has increased in stamina and can now exercise for the entire 45 minutes of alloted aerobic exercise time. We will now focus on increases in level.      Duration Progress to 50 minutes of aerobic without signs/symptoms of physical distress -- Progress to 50 minutes of aerobic without signs/symptoms of physical distress     Intensity Rest + 30 -- Rest + 30     Progression   Progression Continue progressive overload as per policy without signs/symptoms or physical distress. -- Continue progressive overload as per policy without signs/symptoms or physical  distress.     Resistance Training   Training Prescription   Yes     Weight   2     Reps   10-15     Training Prescription (read-only) Yes       Weight (read-only) 2       Reps (read-only) 10-15       Treadmill   MPH  1.8 2     Grade  0 0     Minutes  20 20     MPH (read-only) 1.6       Grade (read-only) 15       Minutes (read-only) 0       NuStep   Level   4     Watts   40     Minutes   20     Level (read-only) 4       Watts (read-only) 40       Minutes (read-only) 20       REL-XR   Level  4 4     Watts  50 50     Minutes  20 20     Biostep-RELP   Level   5     Watts   35     Minutes   20        Exercise Comments:   Discharge Exercise Prescription (Final Exercise Prescription Changes):     Exercise Prescription Changes - 04/10/15 1100    Exercise Review   Progression Yes   Response to Exercise   Blood Pressure (Admit) 124/68 mmHg   Blood Pressure (Exercise) 152/68 mmHg   Blood Pressure (Exit) 112/68 mmHg   Heart Rate (Admit) 75 bpm   Heart Rate (Exercise) 92 bpm    Heart Rate (Exit) 71 bpm   Rating of Perceived Exertion (Exercise) 13   Symptoms None   Comments Karen Cervantes has increased in stamina and can now exercise for the entire 45 minutes of alloted aerobic exercise time. We will now focus on increases in level.    Duration Progress to 50 minutes of aerobic without signs/symptoms of physical distress   Intensity Rest + 30   Progression   Progression Continue progressive overload as per policy without signs/symptoms or physical distress.   Resistance Training   Training Prescription Yes   Weight 2   Reps 10-15   Treadmill   MPH 2   Grade 0   Minutes 20   NuStep   Level 4   Watts 40   Minutes 20   REL-XR   Level 4   Watts 50   Minutes 20   Biostep-RELP   Level 5   Watts 35   Minutes 20      Nutrition:  Target Goals: Understanding of nutrition guidelines, daily intake of sodium <1559m, cholesterol <2075m calories 30% from fat and 7% or less from saturated fats, daily to have 5 or more servings of fruits and vegetables.  Biometrics:     Pre Biometrics - 01/17/15 1133    Pre Biometrics   Height 5' 2.25" (1.581 m)   Weight 191 lb 3.2 oz (86.728 kg)   Waist Circumference 38 inches   Hip Circumference 48 inches   Waist to Hip Ratio 0.79 %   BMI (Calculated) 34.8       Nutrition Therapy Plan and Nutrition Goals:     Nutrition Therapy & Goals - 01/17/15 0851    Intervention Plan   Intervention (read-only) Using nutrition plan and personal goals to  gain a healthy nutrition lifestyle. Add exercise as prescribed.      Nutrition Discharge: Rate Your Plate Scores:   Nutrition Goals Re-Evaluation:   Psychosocial: Target Goals: Acknowledge presence or absence of depression, maximize coping skills, provide positive support system. Participant is able to verbalize types and ability to use techniques and skills needed for reducing stress and depression.  Initial Review & Psychosocial Screening:     Initial Psych Review &  Screening - 01/17/15 0906    Family Dynamics   Good Support System? Yes   Barriers   Psychosocial barriers to participate in program There are no identifiable barriers or psychosocial needs.;The patient should benefit from training in stress management and relaxation.   Screening Interventions   Interventions Encouraged to exercise      Quality of Life Scores:     Quality of Life - 01/17/15 1227    Quality of Life Scores   Health/Function Pre 26.5 %   Socioeconomic Pre 30 %   Psych/Spiritual Pre 28.29 %   Family Pre 30 %   GLOBAL Pre 28.26 %      PHQ-9:     Recent Review Flowsheet Data    Depression screen [facility] 2/9 01/17/2015   Decreased Interest 0   Down, Depressed, Hopeless 0   PHQ - 2 Score 0   Altered sleeping 0   Tired, decreased energy 0   Change in appetite 1    Feeling bad or failure about yourself  0   Trouble concentrating 0   Moving slowly or fidgety/restless 0   Suicidal thoughts 0   PHQ-9 Score 1   Difficult doing work/chores Somewhat difficult      Psychosocial Evaluation and Intervention:     Psychosocial Evaluation - 02/27/15 0954    Psychosocial Evaluation & Interventions   Interventions Encouraged to exercise with the program and follow exercise prescription;Relaxation education   Comments Counselor met with Karen Cervantes for initial psychosocial evaluation.  She is a 60 year old well-adjusted female who had (2) stents inserted in April and December of this past year.  She has a strong support system with a spouse of 58 years and (53) adult daughters, several of which live close by.  She has several health issues with osteoarthritis and Rheumatoid Arthritis in addition to hr cardiac issues.  She reports only sleeping 4 hours per night and has tried several OTC aids to help with this unsuccessfully.  She states she has lost some weight as her appetite has decreased.  She denies a history of depression or anxiety or current symptoms.  Ms. Armendarez is  reportedly typically a positive person who has minimal stress in her life at this time.  Her goals for this program are weight loss, walk without as much pain and increased stamina.  Counselor recommended speaking with her doctor or pharmacist about sustained release  natural sleep aids to help with this issue.  Counselor will be following with Ms. Boline in the future.     Continued Psychosocial Services Needed Yes  Counselor recommends Ms. Zinn speak with her doctor or pharmacist re: OTC extended release sleep aids as an option to address her limited and intermittent sleep.  She will also benefit from meeting with the dietician to address her weight loss goals.        Psychosocial Re-Evaluation:     Psychosocial Re-Evaluation      03/29/15 0947           Psychosocial Re-Evaluation   Comments  Follow up with Ms. Cacioppo stating she has begun the OTC sleep aid with some success initially.   Then she had a steroid infusion which has interrupted her sleep again.  She hopes to return to improved sleep once this wears off.  She continues to work out and stay active.  Counselor commended her on her commitment and progress.            Vocational Rehabilitation: Provide vocational rehab assistance to qualifying candidates.   Vocational Rehab Evaluation & Intervention:     Vocational Rehab - 01/17/15 0849    Initial Vocational Rehab Evaluation & Intervention   Assessment shows need for Vocational Rehabilitation No      Education: Education Goals: Education classes will be provided on a weekly basis, covering required topics. Participant will state understanding/return demonstration of topics presented.  Learning Barriers/Preferences:     Learning Barriers/Preferences - 01/17/15 0849    Learning Barriers/Preferences   Learning Barriers None   Learning Preferences Written Material      Education Topics: General Nutrition Guidelines/Fats and Fiber: -Group instruction provided by  verbal, written material, models and posters to present the general guidelines for heart healthy nutrition. Gives an explanation and review of dietary fats and fiber.   Controlling Sodium/Reading Food Labels: -Group verbal and written material supporting the discussion of sodium use in heart healthy nutrition. Review and explanation with models, verbal and written materials for utilization of the food label.   Exercise Physiology & Risk Factors: - Group verbal and written instruction with models to review the exercise physiology of the cardiovascular system and associated critical values. Details cardiovascular disease risk factors and the goals associated with each risk factor.   Aerobic Exercise & Resistance Training: - Gives group verbal and written discussion on the health impact of inactivity. On the components of aerobic and resistive training programs and the benefits of this training and how to safely progress through these programs.          Cardiac Rehab from 04/10/2015 in [facility] Cardiac and Pulmonary Rehab   Date  04/03/15   Educator  [facility]   Instruction Review Code  2- meets goals/outcomes      Flexibility, Balance, General Exercise Guidelines: - Provides group verbal and written instruction on the benefits of flexibility and balance training programs. Provides general exercise guidelines with specific guidelines to those with heart or lung disease. Demonstration and skill practice provided.      Cardiac Rehab from 04/10/2015 in [facility] Cardiac and Pulmonary Rehab   Date  04/03/15   Educator  [facility]   Instruction Review Code  2- meets goals/outcomes      Stress Management: - Provides group verbal and written instruction about the health risks of elevated stress, cause of high stress, and healthy ways to reduce stress.   Depression: - Provides group verbal and written instruction on the correlation between heart/lung disease and depressed mood, treatment options, and the stigmas  associated with seeking treatment.      Cardiac Rehab from 04/10/2015 in [facility] Cardiac and Pulmonary Rehab   Date  03/22/15   Educator  [facility]   Instruction Review Code  2- meets goals/outcomes      Anatomy & Physiology of the Heart: - Group verbal and written instruction and models provide basic cardiac anatomy and physiology, with the coronary electrical and arterial systems. Review of: AMI, Angina, Valve disease, Heart Failure, Cardiac Arrhythmia, Pacemakers, and the ICD.      Cardiac Rehab from 04/10/2015 in  [place] Cardiac and Pulmonary Rehab   Date  04/10/15   Educator  SB   Instruction Review Code  2- meets goals/outcomes      Cardiac Procedures: - Group verbal and written instruction and models to describe the testing methods done to diagnose heart disease. Reviews the outcomes of the test results. Describes the treatment choices: Medical Management, Angioplasty, or Coronary Bypass Surgery.   Cardiac Medications: - Group verbal and written instruction to review commonly prescribed medications for heart disease. Reviews the medication, class of the drug, and side effects. Includes the steps to properly store meds and maintain the prescription regimen.      Cardiac Rehab from 04/10/2015 in [facility] Cardiac and Pulmonary Rehab   Date  03/08/15   Educator  SB   Instruction Review Code  2- meets goals/outcomes      Go Sex-Intimacy & Heart Disease, Get SMART - Goal Setting: - Group verbal and written instruction through game format to discuss heart disease and the return to sexual intimacy. Provides group verbal and written material to discuss and apply goal setting through the application of the S.M.A.R.T. Method.   Other Matters of the Heart: - Provides group verbal, written materials and models to describe Heart Failure, Angina, Valve Disease, and Diabetes in the realm of heart disease. Includes description of the disease process and treatment options available to the cardiac patient.       Cardiac Rehab from 04/10/2015 in [facility] Cardiac and Pulmonary Rehab   Date  02/27/15   Educator  SB   Instruction Review Code  2- meets goals/outcomes      Exercise & Equipment Safety: - Individual verbal instruction and demonstration of equipment use and safety with use of the equipment.      Cardiac Rehab from 04/10/2015 in [facility] Cardiac and Pulmonary Rehab   Date  01/17/15   Educator  Sb   Instruction Review Code  2- meets goals/outcomes      Infection Prevention: - Provides verbal and written material to individual with discussion of infection control including proper hand washing and proper equipment cleaning during exercise session.      Cardiac Rehab from 04/10/2015 in [facility] Cardiac and Pulmonary Rehab   Date  01/17/15   Educator  Sb   Instruction Review Code  2- meets goals/outcomes      Falls Prevention: - Provides verbal and written material to individual with discussion of falls prevention and safety.      Cardiac Rehab from 04/10/2015 in [facility] Cardiac and Pulmonary Rehab   Date  01/17/15   Educator  SB   Instruction Review Code  2- meets goals/outcomes      Diabetes: - Individual verbal and written instruction to review signs/symptoms of diabetes, desired ranges of glucose level fasting, after meals and with exercise. Advice that pre and Cervantes exercise glucose checks will be done for 3 sessions at entry of program.    Knowledge Questionnaire Score:     Knowledge Questionnaire Score - 01/17/15 0849    Knowledge Questionnaire Score   Pre Score 23/28      Core Components/Risk Factors/Patient Goals at Admission:     Personal Goals and Risk Factors at Admission - 01/17/15 0851    Core Components/Risk Factors/Patient Goals on Admission    Weight Management Obesity;Yes   Intervention (read-only) Learn and follow the exercise and diet guidelines while in the program. Utilize the nutrition and education classes to help gain knowledge of the diet and exercise  expectations in  the program   Intervention (read-only) Provide weight management tools through evaluation completed by registered dietician and exercise physiologist.  Establish a goal weight with participant.   Admit Weight 193 lb (87.544 kg)   Goal Weight: Short Term 140 lb (63.504 kg)   Sedentary Yes;Sedentary  Not able to get up from floor because of knees. Both knees have osteoarthritis and Karen Cervantes is waitnig to be set up for knee replacements.  Rheumatiod arthritis in other joints.  Feet hurt  constantly because of shifting of bones created by the arthritis..   Intervention (read-only) While in program, learn and follow the exercise prescription taught. Start at a low level workload and increase workload after able to maintain previous level for 30 minutes. Increase time before increasing intensity.   Intervention (read-only) Provide exercise education and an individualized exercise prescription that will provide continued progressive overload as per policy without signs/symptoms of physical distress.   Tobacco Cessation --  Quit 20 + years ago   Hypertension Yes   Goal Participant will see blood pressure controlled within the values of 140/48m/Hg or within value directed by their physician.   Intervention (read-only) Provide nutrition & aerobic exercise along with prescribed medications to achieve BP 140/90 or less.   Lipids Yes   Goal Cholesterol controlled with medications as prescribed, with individualized exercise RX and with personalized nutrition plan. Value goals: LDL < '70mg'$ , HDL > '40mg'$ . Participant states understanding of desired cholesterol values and following prescriptions.   Intervention (read-only) Provide nutrition & aerobic exercise along with prescribed medications to achieve LDL '70mg'$ , HDL >'40mg'$ .      Core Components/Risk Factors/Patient Goals Review:      Goals and Risk Factor Review      03/13/15 1058 04/20/15 1000         Core Components/Risk Factors/Patient Goals  Review   Personal Goals Review Increase Aerobic Exercise and Physical Activity Weight Management/Obesity;Hypertension;Lipids      Review  Karen Cervantes ahs had sporadic visits for varied reasons. When she is here in class she does not report cardiac symptoms. Her weight remains steady and her BP readings are in good range and well controlled.       Expected Outcomes  Continue to follow exercise and nutriotn guidelines to maintian control of risk factors and to work on weight loss. Expected goal for weight loss is 2-5 pounds while in the program.      Increase Aerobic Exercise and Physical Activity (read-only)   Goals Progress/Improvement seen  Yes       Comments Karen Cervantes is progressing well in the program and has already increased workloads on both the TM and NS. She said she is already noticing that her body is adjusting to the exercise and she can do more and more each time. Karen Cervantes can successfully exercise for the entire 30-45 minutes of aerobic exericse and in order to continue progression, we will focus on increases in intensity. We will follow up with her in a few weeks about beginning interval training and further workload increases on the machines.           Core Components/Risk Factors/Patient Goals at Discharge (Final Review):      Goals and Risk Factor Review - 04/20/15 1000    Core Components/Risk Factors/Patient Goals Review   Personal Goals Review Weight Management/Obesity;Hypertension;Lipids   Review Karen Cervantes ahs had sporadic visits for varied reasons. When she is here in class she does not report cardiac symptoms. Her weight remains steady and her BP readings are  in good range and well controlled.    Expected Outcomes Continue to follow exercise and nutriotn guidelines to maintian control of risk factors and to work on weight loss. Expected goal for weight loss is 2-5 pounds while in the program.      ITP Comments:     ITP Comments      01/17/15 0851 01/29/15 1204 02/23/15 1120 03/21/15  0741 04/20/15 0959   ITP Comments Initial orientation and ITP. Continue with ITP Ready for 30 day review.  Continue with ITP Ready for 30 day review. Continue with ITP.  Karen Cervantes ahs been absent this month becuse of car trouble and family health concners.  She plans to return next week 30 day review.  Continue with ITP.  Karen Cervantes is attending program. Sporadic attendance.  30 day review  Continue with ITP  Karen Cervantes is out this week because of back pain.     04/26/15 0707           ITP Comments 30 day review  Continue with ITP  Karen Cervantes is out this week because of back pain.          Comments:

## 2015-05-01 ENCOUNTER — Encounter: Payer: Medicare Other | Attending: Cardiovascular Disease

## 2015-05-01 DIAGNOSIS — Z9861 Coronary angioplasty status: Secondary | ICD-10-CM | POA: Insufficient documentation

## 2015-05-03 ENCOUNTER — Other Ambulatory Visit: Payer: Self-pay | Admitting: Physician Assistant

## 2015-05-03 DIAGNOSIS — M5442 Lumbago with sciatica, left side: Secondary | ICD-10-CM

## 2015-05-11 ENCOUNTER — Ambulatory Visit
Admission: RE | Admit: 2015-05-11 | Discharge: 2015-05-11 | Disposition: A | Discharge status: Home / Self Care | Payer: Medicare Other | Source: Ambulatory Visit | Attending: Physician Assistant | Admitting: Physician Assistant

## 2015-05-11 DIAGNOSIS — M5442 Lumbago with sciatica, left side: Secondary | ICD-10-CM | POA: Diagnosis present

## 2015-05-11 DIAGNOSIS — M5124 Other intervertebral disc displacement, thoracic region: Secondary | ICD-10-CM | POA: Insufficient documentation

## 2015-05-11 DIAGNOSIS — M5126 Other intervertebral disc displacement, lumbar region: Secondary | ICD-10-CM | POA: Insufficient documentation

## 2015-05-11 DIAGNOSIS — M4806 Spinal stenosis, lumbar region: Secondary | ICD-10-CM | POA: Diagnosis not present

## 2015-05-21 ENCOUNTER — Encounter: Payer: Self-pay | Admitting: *Deleted

## 2015-05-21 DIAGNOSIS — Z9861 Coronary angioplasty status: Secondary | ICD-10-CM

## 2015-05-21 NOTE — Progress Notes (Signed)
Cardiac Individual Treatment Plan  Patient Details  Name: Karen Cervantes MRN: [id number] Date of Birth: 1955/09/08 Referring Provider:    Initial Encounter Date:       Cardiac Rehab from 01/17/2015 in [facility] Cardiac and Pulmonary Rehab   Date  01/17/15      Visit Diagnosis: No diagnosis found.  Patient's Home Medications on Admission:  Current outpatient prescriptions:  .  aspirin EC 81 MG tablet, Take 1 tablet (81 mg total) by mouth daily., Disp: 30 tablet, Rfl: 0 .  atorvastatin (LIPITOR) 80 MG tablet, Take 80 mg by mouth every evening., Disp: , Rfl:  .  carvedilol (COREG) 12.5 MG tablet, Take 1 tablet (12.5 mg total) by mouth 2 (two) times daily with a meal., Disp: 30 tablet, Rfl: 0 .  clopidogrel (PLAVIX) 75 MG tablet, Take 1 tablet (75 mg total) by mouth daily., Disp: 30 tablet, Rfl: 0 .  cyclobenzaprine (FLEXERIL) 10 MG tablet, Take 10 mg by mouth at bedtime., Disp: , Rfl:  .  ezetimibe (ZETIA) 10 MG tablet, Take 10 mg by mouth daily., Disp: , Rfl:  .  fenofibrate (TRICOR) 48 MG tablet, Take 1 tablet (48 mg total) by mouth daily., Disp: 30 tablet, Rfl: 6 .  FOLIC ACID PO, Take 1 tablet by mouth every morning. , Disp: , Rfl:  .  gabapentin (NEURONTIN) 300 MG capsule, Take 600 mg by mouth 2 (two) times daily. , Disp: , Rfl:  .  lidocaine (LIDODERM) 5 %, Place 1 patch onto the skin every 12 (twelve) hours. Remove & Discard patch within 12 hours or as directed by MD, Disp: 10 patch, Rfl: 0 .  lisinopril (PRINIVIL,ZESTRIL) 10 MG tablet, Take 1 tablet (10 mg total) by mouth daily., Disp: 30 tablet, Rfl: 0 .  Methotrexate, Anti-Rheumatic, (METHOTREXATE, PF, [place]), Inject 0.4 mLs into the skin once a week. Once a week, Disp: , Rfl:  .  Multiple Vitamins-Minerals (MULTIVITAMIN PO), Take 1 tablet by mouth every morning., Disp: , Rfl:  .  nitroGLYCERIN (NITROSTAT) 0.4 MG SL tablet, Place 1 tablet (0.4 mg total) under the tongue every 5 (five) minutes as needed for chest pain., Disp: 25 tablet,  Rfl: 3 .  omeprazole (PRILOSEC) 20 MG capsule, Take 20 mg by mouth 2 (two) times daily before a meal., Disp: , Rfl:  .  oxyCODONE-acetaminophen (PERCOCET) 10-325 MG tablet, Take 1 tablet by mouth every 6 (six) hours as needed for pain., Disp: , Rfl:  .  pantoprazole (PROTONIX) 40 MG tablet, Take 1 tablet (40 mg total) by mouth daily. (Patient not taking: Reported on 01/17/2015), Disp: 30 tablet, Rfl: 6 .  sucralfate (CARAFATE) 1 G tablet, Take 1 tablet (1 g total) by mouth 4 (four) times daily -  before meals and at bedtime. (Patient not taking: Reported on 01/02/2015), Disp: 60 tablet, Rfl: 0  Past Medical History: Past Medical History  Diagnosis Date  . CAD (coronary artery disease)     a. NSTEMI 04/2014; b. cardiac cath 05/30/2014: ost LAD 30%, mLAD 95% s/p PCI/DES, ost RCA 60%, pRCA 50%, dRCA 60%, EF >55% by echo;  c. 12/2014 NSTEMI/PCI: LM nl, LAD 30ost, patent mid stent, D1 small, D2 min dzs, D3 small, LCX min irregs, OM1 small, OM2 min irregs, OM3 70, RCA 60ost, 40p, 49m59m/d, 70d (3.25x28 Xience Alpine DES), RPDA min irregs, EF 55-65%.  .Marland KitchenHLD (hyperlipidemia)   . Obesity   . RA (rheumatoid arthritis) ([facility]   . Diastolic dysfunction     a. echo 04/2014:  EF >32%, no WMA, diastolic dysfunction, no valvular abnormalities, normal RVSP;  b. 12/2014 EF 55-65% by LV gram.  . Hypertension     Tobacco Use: History  Smoking status  . Former Smoker  Smokeless tobacco  . Not on file    Labs: Recent Review Flowsheet Data    Labs for ITP Cardiac and Pulmonary Rehab Latest Ref Rng 05/27/2014 05/28/2014 01/02/2015   Cholestrol - - 324(H) -   LDLCALC - - SEE COMMENT -   HDL - - 36(L) -   Trlycerides - - 658(H) -   Hemoglobin A1c 4.0 - 6.0 % 7.3(H) - 6.3(H)       Exercise Target Goals:    Exercise Program Goal: Individual exercise prescription set with THRR, safety & activity barriers. Participant demonstrates ability to understand and report RPE using BORG scale, to self-measure pulse  accurately, and to acknowledge the importance of the exercise prescription.  Exercise Prescription Goal: Starting with aerobic activity 30 plus minutes a day, 3 days per week for initial exercise prescription. Provide home exercise prescription and guidelines that participant acknowledges understanding prior to discharge.  Activity Barriers & Risk Stratification:     Activity Barriers & Cardiac Risk Stratification - 01/17/15 0847    Activity Barriers & Cardiac Risk Stratification   Activity Barriers Arthritis;Back Problems;Joint Problems  Has sciatia, gets injections for this. Feet hurt all the time, wearing shoes does help reduce the foot pain.  Arthritis , hands, shoulders, jaw, neck. Limited movement beacaue of the arthritis   Cardiac Risk Stratification High      6 Minute Walk:     6 Minute Walk      01/17/15 1133       6 Minute Walk   Phase Initial     Distance 923 feet     Walk Time 5.25 minutes     RPE 13     Symptoms No     Resting HR 56 bpm     Resting BP 152/84 mmHg     Max Ex. HR 97 bpm     Max Ex. BP 154/76 mmHg        Initial Exercise Prescription:     Initial Exercise Prescription - 01/17/15 1100    Date of Initial Exercise RX and Referring Provider   Date 01/17/15   Treadmill   MPH 1.3   Grade 0   Minutes 5  Use 5 minute intervals ie 2 x 5 min with rest inbetween   Bike   Level 0.2   Minutes 10   Recumbant Bike   Level 2   RPM 40   Watts 15   Minutes 10   NuStep   Level 2   Watts 20   Minutes 15   Arm Ergometer   Level 1   Watts 8   Minutes 10   Arm/Foot Ergometer   Level 4   Watts 15   Minutes 10   Cybex   Level 1   RPM 45   Minutes 10   Recumbant Elliptical   Level 1   RPM 40   Watts 10   Minutes 10   REL-XR   Level 2   Watts 30   Minutes 10   T5 Nustep   Level 1   Watts 10   Minutes 15   Biostep-RELP   Level 2   Watts 15   Minutes 15   Prescription Details   Frequency (times per week) 3   Duration Progress to  30 minutes of continuous aerobic without signs/symptoms of physical distress   Intensity   THRR REST +  30   Ratings of Perceived Exertion 11-15   Progression   Progression Continue progressive overload as per policy without signs/symptoms or physical distress.   Resistance Training   Training Prescription Yes   Weight 2   Reps 10-15      Perform Capillary Blood Glucose checks as needed.  Exercise Prescription Changes:     Exercise Prescription Changes      01/17/15 0900 01/24/15 0700 02/14/15 0700 02/27/15 0800 03/01/15 0900   Exercise Review   Progression  No  Has not started program yet, med review was 01/17/15 No  Has not started program yet, med review was 01/17/15 No Yes   Response to Exercise   Blood Pressure (Admit) 152/84 mmHg       Blood Pressure (Exercise) 154/76 mmHg       Blood Pressure (Exit) 138/72 mmHg       Heart Rate (Admit) 54 bpm       Heart Rate (Exercise) 96 bpm       Heart Rate (Exit) 62 bpm       Rating of Perceived Exertion (Exercise) 13       Symptoms no cardiac symptoms       Comments HAD to stop for sciatica pain  which resolved iwth rest   Today was the patient's first day of class. Initial exercise prescription was reviewed with patient and she was able to tolerate it without signs and symptoms. Equipment safety and class procedures were also reviewed with the patient.     Treadmill   MPH (read-only)     1.4   Grade (read-only)     15   Minutes (read-only)     0   NuStep   Level (read-only)     3   Watts (read-only)     35   Minutes (read-only)     15     03/08/15 0800 03/29/15 0800 04/10/15 1100       Exercise Review   Progression Yes Yes Yes     Response to Exercise   Blood Pressure (Admit) 118/64 mmHg -- 124/68 mmHg     Blood Pressure (Exercise) 130/62 mmHg -- 152/68 mmHg     Blood Pressure (Exit) 94/56 mmHg -- 112/68 mmHg     Heart Rate (Admit) 72 bpm -- 75 bpm     Heart Rate (Exercise) 83 bpm -- 92 bpm     Heart Rate (Exit) 71  bpm -- 71 bpm     Rating of Perceived Exertion (Exercise) 12 -- 13     Symptoms None -- None     Comments Reviewed individualized exercise prescription and made increases per departmental policy. Exercise increases were discussed with the patient and they were able to perform the new work loads without issue (no signs or symptoms).  -- Zane has increased in stamina and can now exercise for the entire 45 minutes of alloted aerobic exercise time. We will now focus on increases in level.      Duration Progress to 50 minutes of aerobic without signs/symptoms of physical distress -- Progress to 50 minutes of aerobic without signs/symptoms of physical distress     Intensity Rest + 30 -- Rest + 30     Progression   Progression Continue progressive overload as per policy without signs/symptoms or physical distress. -- Continue progressive overload as per policy without signs/symptoms or physical distress.  Resistance Training   Training Prescription   Yes     Weight   2     Reps   10-15     Training Prescription (read-only) Yes       Weight (read-only) 2       Reps (read-only) 10-15       Treadmill   MPH  1.8 2     Grade  0 0     Minutes  20 20     MPH (read-only) 1.6       Grade (read-only) 15       Minutes (read-only) 0       NuStep   Level   4     Watts   40     Minutes   20     Level (read-only) 4       Watts (read-only) 40       Minutes (read-only) 20       REL-XR   Level  4 4     Watts  50 50     Minutes  20 20     Biostep-RELP   Level   5     Watts   35     Minutes   20        Exercise Comments:     Exercise Comments      05/17/15 4496           Exercise Comments The patient has not attended class since the last review. There has been no progress made due to lack of attendance. Workloads may need to be adjusted if the patient returns to class.           Discharge Exercise Prescription (Final Exercise Prescription Changes):     Exercise Prescription Changes -  04/10/15 1100    Exercise Review   Progression Yes   Response to Exercise   Blood Pressure (Admit) 124/68 mmHg   Blood Pressure (Exercise) 152/68 mmHg   Blood Pressure (Exit) 112/68 mmHg   Heart Rate (Admit) 75 bpm   Heart Rate (Exercise) 92 bpm   Heart Rate (Exit) 71 bpm   Rating of Perceived Exertion (Exercise) 13   Symptoms None   Comments Sashay has increased in stamina and can now exercise for the entire 45 minutes of alloted aerobic exercise time. We will now focus on increases in level.    Duration Progress to 50 minutes of aerobic without signs/symptoms of physical distress   Intensity Rest + 30   Progression   Progression Continue progressive overload as per policy without signs/symptoms or physical distress.   Resistance Training   Training Prescription Yes   Weight 2   Reps 10-15   Treadmill   MPH 2   Grade 0   Minutes 20   NuStep   Level 4   Watts 40   Minutes 20   REL-XR   Level 4   Watts 50   Minutes 20   Biostep-RELP   Level 5   Watts 35   Minutes 20      Nutrition:  Target Goals: Understanding of nutrition guidelines, daily intake of sodium <1553m, cholesterol <2042m calories 30% from fat and 7% or less from saturated fats, daily to have 5 or more servings of fruits and vegetables.  Biometrics:     Pre Biometrics - 01/17/15 1133    Pre Biometrics   Height 5' 2.25" (1.581 m)   Weight 191 lb 3.2 oz (86.728 kg)   Waist  Circumference 38 inches   Hip Circumference 48 inches   Waist to Hip Ratio 0.79 %   BMI (Calculated) 34.8       Nutrition Therapy Plan and Nutrition Goals:     Nutrition Therapy & Goals - 01/17/15 0851    Intervention Plan   Intervention (read-only) Using nutrition plan and personal goals to gain a healthy nutrition lifestyle. Add exercise as prescribed.      Nutrition Discharge: Rate Your Plate Scores:   Nutrition Goals Re-Evaluation:   Psychosocial: Target Goals: Acknowledge presence or absence of depression,  maximize coping skills, provide positive support system. Participant is able to verbalize types and ability to use techniques and skills needed for reducing stress and depression.  Initial Review & Psychosocial Screening:     Initial Psych Review & Screening - 01/17/15 0906    Family Dynamics   Good Support System? Yes   Barriers   Psychosocial barriers to participate in program There are no identifiable barriers or psychosocial needs.;The patient should benefit from training in stress management and relaxation.   Screening Interventions   Interventions Encouraged to exercise      Quality of Life Scores:     Quality of Life - 01/17/15 1227    Quality of Life Scores   Health/Function Pre 26.5 %   Socioeconomic Pre 30 %   Psych/Spiritual Pre 28.29 %   Family Pre 30 %   GLOBAL Pre 28.26 %      PHQ-9:     Recent Review Flowsheet Data    Depression screen [facility] 2/9 01/17/2015   Decreased Interest 0   Down, Depressed, Hopeless 0   PHQ - 2 Score 0   Altered sleeping 0   Tired, decreased energy 0   Change in appetite 1    Feeling bad or failure about yourself  0   Trouble concentrating 0   Moving slowly or fidgety/restless 0   Suicidal thoughts 0   PHQ-9 Score 1   Difficult doing work/chores Somewhat difficult      Psychosocial Evaluation and Intervention:     Psychosocial Evaluation - 02/27/15 0954    Psychosocial Evaluation & Interventions   Interventions Encouraged to exercise with the program and follow exercise prescription;Relaxation education   Comments Counselor met with Ms. Eulas Post for initial psychosocial evaluation.  She is a 60 year old well-adjusted female who had (2) stents inserted in April and December of this past year.  She has a strong support system with a spouse of 76 years and (45) adult daughters, several of which live close by.  She has several health issues with osteoarthritis and Rheumatoid Arthritis in addition to hr cardiac issues.  She reports  only sleeping 4 hours per night and has tried several OTC aids to help with this unsuccessfully.  She states she has lost some weight as her appetite has decreased.  She denies a history of depression or anxiety or current symptoms.  Ms. Falk is reportedly typically a positive person who has minimal stress in her life at this time.  Her goals for this program are weight loss, walk without as much pain and increased stamina.  Counselor recommended speaking with her doctor or pharmacist about sustained release  natural sleep aids to help with this issue.  Counselor will be following with Ms. Runions in the future.     Continued Psychosocial Services Needed Yes  Counselor recommends Ms. Boss speak with her doctor or pharmacist re: OTC extended release sleep aids as an  option to address her limited and intermittent sleep.  She will also benefit from meeting with the dietician to address her weight loss goals.        Psychosocial Re-Evaluation:     Psychosocial Re-Evaluation      03/29/15 0947           Psychosocial Re-Evaluation   Comments Follow up with Ms. Creech stating she has begun the OTC sleep aid with some success initially.   Then she had a steroid infusion which has interrupted her sleep again.  She hopes to return to improved sleep once this wears off.  She continues to work out and stay active.  Counselor commended her on her commitment and progress.            Vocational Rehabilitation: Provide vocational rehab assistance to qualifying candidates.   Vocational Rehab Evaluation & Intervention:     Vocational Rehab - 01/17/15 0849    Initial Vocational Rehab Evaluation & Intervention   Assessment shows need for Vocational Rehabilitation No      Education: Education Goals: Education classes will be provided on a weekly basis, covering required topics. Participant will state understanding/return demonstration of topics presented.  Learning Barriers/Preferences:      Learning Barriers/Preferences - 01/17/15 0849    Learning Barriers/Preferences   Learning Barriers None   Learning Preferences Written Material      Education Topics: General Nutrition Guidelines/Fats and Fiber: -Group instruction provided by verbal, written material, models and posters to present the general guidelines for heart healthy nutrition. Gives an explanation and review of dietary fats and fiber.   Controlling Sodium/Reading Food Labels: -Group verbal and written material supporting the discussion of sodium use in heart healthy nutrition. Review and explanation with models, verbal and written materials for utilization of the food label.   Exercise Physiology & Risk Factors: - Group verbal and written instruction with models to review the exercise physiology of the cardiovascular system and associated critical values. Details cardiovascular disease risk factors and the goals associated with each risk factor.   Aerobic Exercise & Resistance Training: - Gives group verbal and written discussion on the health impact of inactivity. On the components of aerobic and resistive training programs and the benefits of this training and how to safely progress through these programs.          Cardiac Rehab from 04/10/2015 in [facility] Cardiac and Pulmonary Rehab   Date  04/03/15   Educator  [facility]   Instruction Review Code  2- meets goals/outcomes      Flexibility, Balance, General Exercise Guidelines: - Provides group verbal and written instruction on the benefits of flexibility and balance training programs. Provides general exercise guidelines with specific guidelines to those with heart or lung disease. Demonstration and skill practice provided.      Cardiac Rehab from 04/10/2015 in [facility] Cardiac and Pulmonary Rehab   Date  04/03/15   Educator  [facility]   Instruction Review Code  2- meets goals/outcomes      Stress Management: - Provides group verbal and written instruction about the health  risks of elevated stress, cause of high stress, and healthy ways to reduce stress.   Depression: - Provides group verbal and written instruction on the correlation between heart/lung disease and depressed mood, treatment options, and the stigmas associated with seeking treatment.      Cardiac Rehab from 04/10/2015 in [facility] Cardiac and Pulmonary Rehab   Date  03/22/15   Educator  [facility]   Instruction Review  Code  2- meets goals/outcomes      Anatomy & Physiology of the Heart: - Group verbal and written instruction and models provide basic cardiac anatomy and physiology, with the coronary electrical and arterial systems. Review of: AMI, Angina, Valve disease, Heart Failure, Cardiac Arrhythmia, Pacemakers, and the ICD.      Cardiac Rehab from 04/10/2015 in [facility] Cardiac and Pulmonary Rehab   Date  04/10/15   Educator  SB   Instruction Review Code  2- meets goals/outcomes      Cardiac Procedures: - Group verbal and written instruction and models to describe the testing methods done to diagnose heart disease. Reviews the outcomes of the test results. Describes the treatment choices: Medical Management, Angioplasty, or Coronary Bypass Surgery.   Cardiac Medications: - Group verbal and written instruction to review commonly prescribed medications for heart disease. Reviews the medication, class of the drug, and side effects. Includes the steps to properly store meds and maintain the prescription regimen.      Cardiac Rehab from 04/10/2015 in [facility] Cardiac and Pulmonary Rehab   Date  03/08/15   Educator  SB   Instruction Review Code  2- meets goals/outcomes      Go Sex-Intimacy & Heart Disease, Get SMART - Goal Setting: - Group verbal and written instruction through game format to discuss heart disease and the return to sexual intimacy. Provides group verbal and written material to discuss and apply goal setting through the application of the S.M.A.R.T. Method.   Other Matters of the Heart: -  Provides group verbal, written materials and models to describe Heart Failure, Angina, Valve Disease, and Diabetes in the realm of heart disease. Includes description of the disease process and treatment options available to the cardiac patient.      Cardiac Rehab from 04/10/2015 in [facility] Cardiac and Pulmonary Rehab   Date  02/27/15   Educator  SB   Instruction Review Code  2- meets goals/outcomes      Exercise & Equipment Safety: - Individual verbal instruction and demonstration of equipment use and safety with use of the equipment.      Cardiac Rehab from 04/10/2015 in [facility] Cardiac and Pulmonary Rehab   Date  01/17/15   Educator  Sb   Instruction Review Code  2- meets goals/outcomes      Infection Prevention: - Provides verbal and written material to individual with discussion of infection control including proper hand washing and proper equipment cleaning during exercise session.      Cardiac Rehab from 04/10/2015 in [facility] Cardiac and Pulmonary Rehab   Date  01/17/15   Educator  Sb   Instruction Review Code  2- meets goals/outcomes      Falls Prevention: - Provides verbal and written material to individual with discussion of falls prevention and safety.      Cardiac Rehab from 04/10/2015 in [facility] Cardiac and Pulmonary Rehab   Date  01/17/15   Educator  SB   Instruction Review Code  2- meets goals/outcomes      Diabetes: - Individual verbal and written instruction to review signs/symptoms of diabetes, desired ranges of glucose level fasting, after meals and with exercise. Advice that pre and post exercise glucose checks will be done for 3 sessions at entry of program.    Knowledge Questionnaire Score:     Knowledge Questionnaire Score - 01/17/15 0849    Knowledge Questionnaire Score   Pre Score 23/28      Core Components/Risk Factors/Patient Goals at Admission:  Personal Goals and Risk Factors at Admission - 01/17/15 0851    Core Components/Risk Factors/Patient  Goals on Admission    Weight Management Obesity;Yes   Intervention (read-only) Learn and follow the exercise and diet guidelines while in the program. Utilize the nutrition and education classes to help gain knowledge of the diet and exercise expectations in the program   Intervention (read-only) Provide weight management tools through evaluation completed by registered dietician and exercise physiologist.  Establish a goal weight with participant.   Admit Weight 193 lb (87.544 kg)   Goal Weight: Short Term 140 lb (63.504 kg)   Sedentary Yes;Sedentary  Not able to get up from floor because of knees. Both knees have osteoarthritis and Avril is waitnig to be set up for knee replacements.  Rheumatiod arthritis in other joints.  Feet hurt  constantly because of shifting of bones created by the arthritis..   Intervention (read-only) While in program, learn and follow the exercise prescription taught. Start at a low level workload and increase workload after able to maintain previous level for 30 minutes. Increase time before increasing intensity.   Intervention (read-only) Provide exercise education and an individualized exercise prescription that will provide continued progressive overload as per policy without signs/symptoms of physical distress.   Tobacco Cessation --  Quit 20 + years ago   Hypertension Yes   Goal Participant will see blood pressure controlled within the values of 140/7m/Hg or within value directed by their physician.   Intervention (read-only) Provide nutrition & aerobic exercise along with prescribed medications to achieve BP 140/90 or less.   Lipids Yes   Goal Cholesterol controlled with medications as prescribed, with individualized exercise RX and with personalized nutrition plan. Value goals: LDL < 786m HDL > 4060mParticipant states understanding of desired cholesterol values and following prescriptions.   Intervention (read-only) Provide nutrition & aerobic exercise along  with prescribed medications to achieve LDL <21m19mDL >40mg3m   Core Components/Risk Factors/Patient Goals Review:      Goals and Risk Factor Review      03/13/15 1058 04/20/15 1000         Core Components/Risk Factors/Patient Goals Review   Personal Goals Review Increase Aerobic Exercise and Physical Activity Weight Management/Obesity;Hypertension;Lipids      Review  Kween ahs had sporadic visits for varied reasons. When she is here in class she does not report cardiac symptoms. Her weight remains steady and her BP readings are in good range and well controlled.       Expected Outcomes  Continue to follow exercise and nutriotn guidelines to maintian control of risk factors and to work on weight loss. Expected goal for weight loss is 2-5 pounds while in the program.      Increase Aerobic Exercise and Physical Activity (read-only)   Goals Progress/Improvement seen  Yes       Comments Liesl is progressing well in the program and has already increased workloads on both the TM and NS. She said she is already noticing that her body is adjusting to the exercise and she can do more and more each time. Leaira can successfully exercise for the entire 30-45 minutes of aerobic exericse and in order to continue progression, we will focus on increases in intensity. We will follow up with her in a few weeks about beginning interval training and further workload increases on the machines.           Core Components/Risk Factors/Patient Goals at Discharge (Final Review):  Goals and Risk Factor Review - 04/20/15 1000    Core Components/Risk Factors/Patient Goals Review   Personal Goals Review Weight Management/Obesity;Hypertension;Lipids   Review Calia ahs had sporadic visits for varied reasons. When she is here in class she does not report cardiac symptoms. Her weight remains steady and her BP readings are in good range and well controlled.    Expected Outcomes Continue to follow exercise and  nutriotn guidelines to maintian control of risk factors and to work on weight loss. Expected goal for weight loss is 2-5 pounds while in the program.      ITP Comments:     ITP Comments      01/17/15 0851 01/29/15 1204 02/23/15 1120 03/21/15 0741 04/20/15 0959   ITP Comments Initial orientation and ITP. Continue with ITP Ready for 30 day review.  Continue with ITP Ready for 30 day review. Continue with ITP.  Cinderella ahs been absent this month becuse of car trouble and family health concners.  She plans to return next week 30 day review.  Continue with ITP.  Franceska is attending program. Sporadic attendance.  30 day review  Continue with ITP  Jeslyn is out this week because of back pain.     04/26/15 0707 05/21/15 0905         ITP Comments 30 day review  Continue with ITP  Franziska is out this week because of back pain. 30 day review  Continue with ITP  Maura remains out because of back pain.         Comments:

## 2015-05-24 ENCOUNTER — Ambulatory Visit: Payer: Medicare Other

## 2015-05-25 ENCOUNTER — Ambulatory Visit: Payer: Medicare Other

## 2015-06-18 ENCOUNTER — Telehealth: Payer: Self-pay | Admitting: *Deleted

## 2015-06-18 ENCOUNTER — Encounter: Payer: Self-pay | Admitting: *Deleted

## 2015-06-18 DIAGNOSIS — Z9861 Coronary angioplasty status: Secondary | ICD-10-CM

## 2015-06-18 NOTE — Telephone Encounter (Signed)
Legacy will be out for extended time, back problems.  MD wants her to hold off on Cardiac Rehab and do water exercise. She will call us when ready to be released to Cardiac Rehab.

## 2015-06-18 NOTE — Progress Notes (Signed)
Cardiac Individual Treatment Plan  Patient Details  Name: Karen Cervantes MRN: [id number] Date of Birth: 04/22/1955 Referring Provider:    Initial Encounter Date:       Cardiac Rehab from 01/17/2015 in [facility] Cardiac and Pulmonary Rehab   Date  01/17/15      Visit Diagnosis: S/P PTCA (percutaneous transluminal coronary angioplasty)  Patient's Home Medications on Admission:  Current outpatient prescriptions:  .  aspirin EC 81 MG tablet, Take 1 tablet (81 mg total) by mouth daily., Disp: 30 tablet, Rfl: 0 .  atorvastatin (LIPITOR) 80 MG tablet, Take 80 mg by mouth every evening., Disp: , Rfl:  .  carvedilol (COREG) 12.5 MG tablet, Take 1 tablet (12.5 mg total) by mouth 2 (two) times daily with a meal., Disp: 30 tablet, Rfl: 0 .  clopidogrel (PLAVIX) 75 MG tablet, Take 1 tablet (75 mg total) by mouth daily., Disp: 30 tablet, Rfl: 0 .  cyclobenzaprine (FLEXERIL) 10 MG tablet, Take 10 mg by mouth at bedtime., Disp: , Rfl:  .  ezetimibe (ZETIA) 10 MG tablet, Take 10 mg by mouth daily., Disp: , Rfl:  .  fenofibrate (TRICOR) 48 MG tablet, Take 1 tablet (48 mg total) by mouth daily., Disp: 30 tablet, Rfl: 6 .  FOLIC ACID PO, Take 1 tablet by mouth every morning. , Disp: , Rfl:  .  gabapentin (NEURONTIN) 300 MG capsule, Take 600 mg by mouth 2 (two) times daily. , Disp: , Rfl:  .  lidocaine (LIDODERM) 5 %, Place 1 patch onto the skin every 12 (twelve) hours. Remove & Discard patch within 12 hours or as directed by MD, Disp: 10 patch, Rfl: 0 .  lisinopril (PRINIVIL,ZESTRIL) 10 MG tablet, Take 1 tablet (10 mg total) by mouth daily., Disp: 30 tablet, Rfl: 0 .  Methotrexate, Anti-Rheumatic, (METHOTREXATE, PF, Pringle), Inject 0.4 mLs into the skin once a week. Once a week, Disp: , Rfl:  .  Multiple Vitamins-Minerals (MULTIVITAMIN PO), Take 1 tablet by mouth every morning., Disp: , Rfl:  .  nitroGLYCERIN (NITROSTAT) 0.4 MG SL tablet, Place 1 tablet (0.4 mg total) under the tongue every 5 (five) minutes as  needed for chest pain., Disp: 25 tablet, Rfl: 3 .  omeprazole (PRILOSEC) 20 MG capsule, Take 20 mg by mouth 2 (two) times daily before a meal., Disp: , Rfl:  .  oxyCODONE-acetaminophen (PERCOCET) 10-325 MG tablet, Take 1 tablet by mouth every 6 (six) hours as needed for pain., Disp: , Rfl:  .  pantoprazole (PROTONIX) 40 MG tablet, Take 1 tablet (40 mg total) by mouth daily. (Patient not taking: Reported on 01/17/2015), Disp: 30 tablet, Rfl: 6 .  sucralfate (CARAFATE) 1 G tablet, Take 1 tablet (1 g total) by mouth 4 (four) times daily -  before meals and at bedtime. (Patient not taking: Reported on 01/02/2015), Disp: 60 tablet, Rfl: 0  Past Medical History: Past Medical History  Diagnosis Date  . CAD (coronary artery disease)     a. NSTEMI 04/2014; b. cardiac cath 05/30/2014: ost LAD 30%, mLAD 95% s/p PCI/DES, ost RCA 60%, pRCA 50%, dRCA 60%, EF >55% by echo;  c. 12/2014 NSTEMI/PCI: LM nl, LAD 30ost, patent mid stent, D1 small, D2 min dzs, D3 small, LCX min irregs, OM1 small, OM2 min irregs, OM3 70, RCA 60ost, 40p, 59m40m/d, 70d (3.25x28 Xience Alpine DES), RPDA min irregs, EF 55-65%.  .Marland KitchenHLD (hyperlipidemia)   . Obesity   . RA (rheumatoid arthritis) (HCaptain Cook   . Diastolic dysfunction  a. echo 04/2014: EF >20%, no WMA, diastolic dysfunction, no valvular abnormalities, normal RVSP;  b. 12/2014 EF 55-65% by LV gram.  . Hypertension     Tobacco Use: History  Smoking status  . Former Smoker  Smokeless tobacco  . Not on file    Labs: Recent Review Flowsheet Data    Labs for ITP Cardiac and Pulmonary Rehab Latest Ref Rng 05/27/2014 05/28/2014 01/02/2015   Cholestrol - - 324(H) -   LDLCALC - - SEE COMMENT -   HDL - - 36(L) -   Trlycerides - - 658(H) -   Hemoglobin A1c 4.0 - 6.0 % 7.3(H) - 6.3(H)       Exercise Target Goals:    Exercise Program Goal: Individual exercise prescription set with THRR, safety & activity barriers. Participant demonstrates ability to understand and report RPE  using BORG scale, to self-measure pulse accurately, and to acknowledge the importance of the exercise prescription.  Exercise Prescription Goal: Starting with aerobic activity 30 plus minutes a day, 3 days per week for initial exercise prescription. Provide home exercise prescription and guidelines that participant acknowledges understanding prior to discharge.  Activity Barriers & Risk Stratification:     Activity Barriers & Cardiac Risk Stratification - 01/17/15 0847    Activity Barriers & Cardiac Risk Stratification   Activity Barriers Arthritis;Back Problems;Joint Problems  Has sciatia, gets injections for this. Feet hurt all the time, wearing shoes does help reduce the foot pain.  Arthritis , hands, shoulders, jaw, neck. Limited movement beacaue of the arthritis   Cardiac Risk Stratification High      6 Minute Walk:     6 Minute Walk      01/17/15 1133       6 Minute Walk   Phase Initial     Distance 923 feet     Walk Time 5.25 minutes     RPE 13     Symptoms No     Resting HR 56 bpm     Resting BP 152/84 mmHg     Max Ex. HR 97 bpm     Max Ex. BP 154/76 mmHg        Initial Exercise Prescription:     Initial Exercise Prescription - 01/17/15 1100    Date of Initial Exercise RX and Referring Provider   Date 01/17/15   Treadmill   MPH 1.3   Grade 0   Minutes 5  Use 5 minute intervals ie 2 x 5 min with rest inbetween   Bike   Level 0.2   Minutes 10   Recumbant Bike   Level 2   RPM 40   Watts 15   Minutes 10   NuStep   Level 2   Watts 20   Minutes 15   Arm Ergometer   Level 1   Watts 8   Minutes 10   Arm/Foot Ergometer   Level 4   Watts 15   Minutes 10   Cybex   Level 1   RPM 45   Minutes 10   Recumbant Elliptical   Level 1   RPM 40   Watts 10   Minutes 10   REL-XR   Level 2   Watts 30   Minutes 10   T5 Nustep   Level 1   Watts 10   Minutes 15   Biostep-RELP   Level 2   Watts 15   Minutes 15   Prescription Details   Frequency  (times per week) 3  Duration Progress to 30 minutes of continuous aerobic without signs/symptoms of physical distress   Intensity   THRR REST +  30   Ratings of Perceived Exertion 11-15   Progression   Progression Continue progressive overload as per policy without signs/symptoms or physical distress.   Resistance Training   Training Prescription Yes   Weight 2   Reps 10-15      Perform Capillary Blood Glucose checks as needed.  Exercise Prescription Changes:     Exercise Prescription Changes      01/17/15 0900 01/24/15 0700 02/14/15 0700 02/27/15 0800 03/01/15 0900   Exercise Review   Progression  No  Has not started program yet, med review was 01/17/15 No  Has not started program yet, med review was 01/17/15 No Yes   Response to Exercise   Blood Pressure (Admit) 152/84 mmHg       Blood Pressure (Exercise) 154/76 mmHg       Blood Pressure (Exit) 138/72 mmHg       Heart Rate (Admit) 54 bpm       Heart Rate (Exercise) 96 bpm       Heart Rate (Exit) 62 bpm       Rating of Perceived Exertion (Exercise) 13       Symptoms no cardiac symptoms       Comments HAD to stop for sciatica pain  which resolved iwth rest   Today was the patient's first day of class. Initial exercise prescription was reviewed with patient and she was able to tolerate it without signs and symptoms. Equipment safety and class procedures were also reviewed with the patient.     Treadmill   MPH (read-only)     1.4   Grade (read-only)     15   Minutes (read-only)     0   NuStep   Level (read-only)     3   Watts (read-only)     35   Minutes (read-only)     15     03/08/15 0800 03/29/15 0800 04/10/15 1100       Exercise Review   Progression Yes Yes Yes     Response to Exercise   Blood Pressure (Admit) 118/64 mmHg -- 124/68 mmHg     Blood Pressure (Exercise) 130/62 mmHg -- 152/68 mmHg     Blood Pressure (Exit) 94/56 mmHg -- 112/68 mmHg     Heart Rate (Admit) 72 bpm -- 75 bpm     Heart Rate (Exercise) 83  bpm -- 92 bpm     Heart Rate (Exit) 71 bpm -- 71 bpm     Rating of Perceived Exertion (Exercise) 12 -- 13     Symptoms None -- None     Comments Reviewed individualized exercise prescription and made increases per departmental policy. Exercise increases were discussed with the patient and they were able to perform the new work loads without issue (no signs or symptoms).  -- Atleigh has increased in stamina and can now exercise for the entire 45 minutes of alloted aerobic exercise time. We will now focus on increases in level.      Duration Progress to 50 minutes of aerobic without signs/symptoms of physical distress -- Progress to 50 minutes of aerobic without signs/symptoms of physical distress     Intensity Rest + 30 -- Rest + 30     Progression   Progression Continue progressive overload as per policy without signs/symptoms or physical distress. -- Continue progressive overload as per policy without signs/symptoms  or physical distress.     Resistance Training   Training Prescription   Yes     Weight   2     Reps   10-15     Training Prescription (read-only) Yes       Weight (read-only) 2       Reps (read-only) 10-15       Treadmill   MPH  1.8 2     Grade  0 0     Minutes  20 20     MPH (read-only) 1.6       Grade (read-only) 15       Minutes (read-only) 0       NuStep   Level   4     Watts   40     Minutes   20     Level (read-only) 4       Watts (read-only) 40       Minutes (read-only) 20       REL-XR   Level  4 4     Watts  50 50     Minutes  20 20     Biostep-RELP   Level   5     Watts   35     Minutes   20        Exercise Comments:     Exercise Comments      05/17/15 6237 06/14/15 0647         Exercise Comments The patient has not attended class since the last review. There has been no progress made due to lack of attendance. Workloads may need to be adjusted if the patient returns to class.  The patient has not attended class since the last review. There has  been no progress made due to lack of attendance. Workloads may need to be adjusted if the patient returns to class.          Discharge Exercise Prescription (Final Exercise Prescription Changes):     Exercise Prescription Changes - 04/10/15 1100    Exercise Review   Progression Yes   Response to Exercise   Blood Pressure (Admit) 124/68 mmHg   Blood Pressure (Exercise) 152/68 mmHg   Blood Pressure (Exit) 112/68 mmHg   Heart Rate (Admit) 75 bpm   Heart Rate (Exercise) 92 bpm   Heart Rate (Exit) 71 bpm   Rating of Perceived Exertion (Exercise) 13   Symptoms None   Comments Anira has increased in stamina and can now exercise for the entire 45 minutes of alloted aerobic exercise time. We will now focus on increases in level.    Duration Progress to 50 minutes of aerobic without signs/symptoms of physical distress   Intensity Rest + 30   Progression   Progression Continue progressive overload as per policy without signs/symptoms or physical distress.   Resistance Training   Training Prescription Yes   Weight 2   Reps 10-15   Treadmill   MPH 2   Grade 0   Minutes 20   NuStep   Level 4   Watts 40   Minutes 20   REL-XR   Level 4   Watts 50   Minutes 20   Biostep-RELP   Level 5   Watts 35   Minutes 20      Nutrition:  Target Goals: Understanding of nutrition guidelines, daily intake of sodium <1589m, cholesterol <2020m calories 30% from fat and 7% or less from saturated fats, daily to have 5 or more  servings of fruits and vegetables.  Biometrics:     Pre Biometrics - 01/17/15 1133    Pre Biometrics   Height 5' 2.25" (1.581 m)   Weight 191 lb 3.2 oz (86.728 kg)   Waist Circumference 38 inches   Hip Circumference 48 inches   Waist to Hip Ratio 0.79 %   BMI (Calculated) 34.8       Nutrition Therapy Plan and Nutrition Goals:     Nutrition Therapy & Goals - 01/17/15 0851    Intervention Plan   Intervention (read-only) Using nutrition plan and personal goals to  gain a healthy nutrition lifestyle. Add exercise as prescribed.      Nutrition Discharge: Rate Your Plate Scores:   Nutrition Goals Re-Evaluation:   Psychosocial: Target Goals: Acknowledge presence or absence of depression, maximize coping skills, provide positive support system. Participant is able to verbalize types and ability to use techniques and skills needed for reducing stress and depression.  Initial Review & Psychosocial Screening:     Initial Psych Review & Screening - 01/17/15 0906    Family Dynamics   Good Support System? Yes   Barriers   Psychosocial barriers to participate in program There are no identifiable barriers or psychosocial needs.;The patient should benefit from training in stress management and relaxation.   Screening Interventions   Interventions Encouraged to exercise      Quality of Life Scores:     Quality of Life - 01/17/15 1227    Quality of Life Scores   Health/Function Pre 26.5 %   Socioeconomic Pre 30 %   Psych/Spiritual Pre 28.29 %   Family Pre 30 %   GLOBAL Pre 28.26 %      PHQ-9:     Recent Review Flowsheet Data    Depression screen [facility] 2/9 01/17/2015   Decreased Interest 0   Down, Depressed, Hopeless 0   PHQ - 2 Score 0   Altered sleeping 0   Tired, decreased energy 0   Change in appetite 1    Feeling bad or failure about yourself  0   Trouble concentrating 0   Moving slowly or fidgety/restless 0   Suicidal thoughts 0   PHQ-9 Score 1   Difficult doing work/chores Somewhat difficult      Psychosocial Evaluation and Intervention:     Psychosocial Evaluation - 02/27/15 0954    Psychosocial Evaluation & Interventions   Interventions Encouraged to exercise with the program and follow exercise prescription;Relaxation education   Comments Counselor met with Ms. Eulas Post for initial psychosocial evaluation.  She is a 60 year old well-adjusted female who had (2) stents inserted in April and December of this past year.  She  has a strong support system with a spouse of 42 years and (5) adult daughters, several of which live close by.  She has several health issues with osteoarthritis and Rheumatoid Arthritis in addition to hr cardiac issues.  She reports only sleeping 4 hours per night and has tried several OTC aids to help with this unsuccessfully.  She states she has lost some weight as her appetite has decreased.  She denies a history of depression or anxiety or current symptoms.  Ms. Joynt is reportedly typically a positive person who has minimal stress in her life at this time.  Her goals for this program are weight loss, walk without as much pain and increased stamina.  Counselor recommended speaking with her doctor or pharmacist about sustained release  natural sleep aids to help with this  issue.  Counselor will be following with Ms. Havey in the future.     Continued Psychosocial Services Needed Yes  Counselor recommends Ms. Folker speak with her doctor or pharmacist re: OTC extended release sleep aids as an option to address her limited and intermittent sleep.  She will also benefit from meeting with the dietician to address her weight loss goals.        Psychosocial Re-Evaluation:     Psychosocial Re-Evaluation      03/29/15 0947           Psychosocial Re-Evaluation   Comments Follow up with Ms. Murin stating she has begun the OTC sleep aid with some success initially.   Then she had a steroid infusion which has interrupted her sleep again.  She hopes to return to improved sleep once this wears off.  She continues to work out and stay active.  Counselor commended her on her commitment and progress.            Vocational Rehabilitation: Provide vocational rehab assistance to qualifying candidates.   Vocational Rehab Evaluation & Intervention:     Vocational Rehab - 01/17/15 0849    Initial Vocational Rehab Evaluation & Intervention   Assessment shows need for Vocational Rehabilitation No       Education: Education Goals: Education classes will be provided on a weekly basis, covering required topics. Participant will state understanding/return demonstration of topics presented.  Learning Barriers/Preferences:     Learning Barriers/Preferences - 01/17/15 0849    Learning Barriers/Preferences   Learning Barriers None   Learning Preferences Written Material      Education Topics: General Nutrition Guidelines/Fats and Fiber: -Group instruction provided by verbal, written material, models and posters to present the general guidelines for heart healthy nutrition. Gives an explanation and review of dietary fats and fiber.   Controlling Sodium/Reading Food Labels: -Group verbal and written material supporting the discussion of sodium use in heart healthy nutrition. Review and explanation with models, verbal and written materials for utilization of the food label.   Exercise Physiology & Risk Factors: - Group verbal and written instruction with models to review the exercise physiology of the cardiovascular system and associated critical values. Details cardiovascular disease risk factors and the goals associated with each risk factor.   Aerobic Exercise & Resistance Training: - Gives group verbal and written discussion on the health impact of inactivity. On the components of aerobic and resistive training programs and the benefits of this training and how to safely progress through these programs.          Cardiac Rehab from 04/10/2015 in [facility] Cardiac and Pulmonary Rehab   Date  04/03/15   Educator  [facility]   Instruction Review Code  2- meets goals/outcomes      Flexibility, Balance, General Exercise Guidelines: - Provides group verbal and written instruction on the benefits of flexibility and balance training programs. Provides general exercise guidelines with specific guidelines to those with heart or lung disease. Demonstration and skill practice provided.      Cardiac  Rehab from 04/10/2015 in [facility] Cardiac and Pulmonary Rehab   Date  04/03/15   Educator  [facility]   Instruction Review Code  2- meets goals/outcomes      Stress Management: - Provides group verbal and written instruction about the health risks of elevated stress, cause of high stress, and healthy ways to reduce stress.   Depression: - Provides group verbal and written instruction on the correlation between heart/lung disease and  depressed mood, treatment options, and the stigmas associated with seeking treatment.      Cardiac Rehab from 04/10/2015 in [facility] Cardiac and Pulmonary Rehab   Date  03/22/15   Educator  [facility]   Instruction Review Code  2- meets goals/outcomes      Anatomy & Physiology of the Heart: - Group verbal and written instruction and models provide basic cardiac anatomy and physiology, with the coronary electrical and arterial systems. Review of: AMI, Angina, Valve disease, Heart Failure, Cardiac Arrhythmia, Pacemakers, and the ICD.      Cardiac Rehab from 04/10/2015 in [facility] Cardiac and Pulmonary Rehab   Date  04/10/15   Educator  SB   Instruction Review Code  2- meets goals/outcomes      Cardiac Procedures: - Group verbal and written instruction and models to describe the testing methods done to diagnose heart disease. Reviews the outcomes of the test results. Describes the treatment choices: Medical Management, Angioplasty, or Coronary Bypass Surgery.   Cardiac Medications: - Group verbal and written instruction to review commonly prescribed medications for heart disease. Reviews the medication, class of the drug, and side effects. Includes the steps to properly store meds and maintain the prescription regimen.      Cardiac Rehab from 04/10/2015 in [facility] Cardiac and Pulmonary Rehab   Date  03/08/15   Educator  SB   Instruction Review Code  2- meets goals/outcomes      Go Sex-Intimacy & Heart Disease, Get SMART - Goal Setting: - Group verbal and written instruction through  game format to discuss heart disease and the return to sexual intimacy. Provides group verbal and written material to discuss and apply goal setting through the application of the S.M.A.R.T. Method.   Other Matters of the Heart: - Provides group verbal, written materials and models to describe Heart Failure, Angina, Valve Disease, and Diabetes in the realm of heart disease. Includes description of the disease process and treatment options available to the cardiac patient.      Cardiac Rehab from 04/10/2015 in [facility] Cardiac and Pulmonary Rehab   Date  02/27/15   Educator  SB   Instruction Review Code  2- meets goals/outcomes      Exercise & Equipment Safety: - Individual verbal instruction and demonstration of equipment use and safety with use of the equipment.      Cardiac Rehab from 04/10/2015 in [facility] Cardiac and Pulmonary Rehab   Date  01/17/15   Educator  Sb   Instruction Review Code  2- meets goals/outcomes      Infection Prevention: - Provides verbal and written material to individual with discussion of infection control including proper hand washing and proper equipment cleaning during exercise session.      Cardiac Rehab from 04/10/2015 in [facility] Cardiac and Pulmonary Rehab   Date  01/17/15   Educator  Sb   Instruction Review Code  2- meets goals/outcomes      Falls Prevention: - Provides verbal and written material to individual with discussion of falls prevention and safety.      Cardiac Rehab from 04/10/2015 in [facility] Cardiac and Pulmonary Rehab   Date  01/17/15   Educator  SB   Instruction Review Code  2- meets goals/outcomes      Diabetes: - Individual verbal and written instruction to review signs/symptoms of diabetes, desired ranges of glucose level fasting, after meals and with exercise. Advice that pre and post exercise glucose checks will be done for 3 sessions at entry of program.  Knowledge Questionnaire Score:     Knowledge Questionnaire Score - 01/17/15  0849    Knowledge Questionnaire Score   Pre Score 23/28      Core Components/Risk Factors/Patient Goals at Admission:     Personal Goals and Risk Factors at Admission - 01/17/15 0851    Core Components/Risk Factors/Patient Goals on Admission    Weight Management Obesity;Yes   Intervention (read-only) Learn and follow the exercise and diet guidelines while in the program. Utilize the nutrition and education classes to help gain knowledge of the diet and exercise expectations in the program   Intervention (read-only) Provide weight management tools through evaluation completed by registered dietician and exercise physiologist.  Establish a goal weight with participant.   Admit Weight 193 lb (87.544 kg)   Goal Weight: Short Term 140 lb (63.504 kg)   Sedentary Yes;Sedentary  Not able to get up from floor because of knees. Both knees have osteoarthritis and Savaya is waitnig to be set up for knee replacements.  Rheumatiod arthritis in other joints.  Feet hurt  constantly because of shifting of bones created by the arthritis..   Intervention (read-only) While in program, learn and follow the exercise prescription taught. Start at a low level workload and increase workload after able to maintain previous level for 30 minutes. Increase time before increasing intensity.   Intervention (read-only) Provide exercise education and an individualized exercise prescription that will provide continued progressive overload as per policy without signs/symptoms of physical distress.   Tobacco Cessation --  Quit 20 + years ago   Hypertension Yes   Goal Participant will see blood pressure controlled within the values of 140/46m/Hg or within value directed by their physician.   Intervention (read-only) Provide nutrition & aerobic exercise along with prescribed medications to achieve BP 140/90 or less.   Lipids Yes   Goal Cholesterol controlled with medications as prescribed, with individualized exercise RX and  with personalized nutrition plan. Value goals: LDL < '70mg'$ , HDL > '40mg'$ . Participant states understanding of desired cholesterol values and following prescriptions.   Intervention (read-only) Provide nutrition & aerobic exercise along with prescribed medications to achieve LDL '70mg'$ , HDL >'40mg'$ .      Core Components/Risk Factors/Patient Goals Review:      Goals and Risk Factor Review      03/13/15 1058 04/20/15 1000         Core Components/Risk Factors/Patient Goals Review   Personal Goals Review Increase Aerobic Exercise and Physical Activity Weight Management/Obesity;Hypertension;Lipids      Review  Aanvi ahs had sporadic visits for varied reasons. When she is here in class she does not report cardiac symptoms. Her weight remains steady and her BP readings are in good range and well controlled.       Expected Outcomes  Continue to follow exercise and nutriotn guidelines to maintian control of risk factors and to work on weight loss. Expected goal for weight loss is 2-5 pounds while in the program.      Increase Aerobic Exercise and Physical Activity (read-only)   Goals Progress/Improvement seen  Yes       Comments Serayah is progressing well in the program and has already increased workloads on both the TM and NS. She said she is already noticing that her body is adjusting to the exercise and she can do more and more each time. Carigan can successfully exercise for the entire 30-45 minutes of aerobic exericse and in order to continue progression, we will focus on increases in intensity. We  will follow up with her in a few weeks about beginning interval training and further workload increases on the machines.           Core Components/Risk Factors/Patient Goals at Discharge (Final Review):      Goals and Risk Factor Review - 04/20/15 1000    Core Components/Risk Factors/Patient Goals Review   Personal Goals Review Weight Management/Obesity;Hypertension;Lipids   Review Henya ahs had sporadic  visits for varied reasons. When she is here in class she does not report cardiac symptoms. Her weight remains steady and her BP readings are in good range and well controlled.    Expected Outcomes Continue to follow exercise and nutriotn guidelines to maintian control of risk factors and to work on weight loss. Expected goal for weight loss is 2-5 pounds while in the program.      ITP Comments:     ITP Comments      01/17/15 0851 01/29/15 1204 02/23/15 1120 03/21/15 0741 04/20/15 0959   ITP Comments Initial orientation and ITP. Continue with ITP Ready for 30 day review.  Continue with ITP Ready for 30 day review. Continue with ITP.  Takirah ahs been absent this month becuse of car trouble and family health concners.  She plans to return next week 30 day review.  Continue with ITP.  Oriah is attending program. Sporadic attendance.  30 day review  Continue with ITP  Sabrin is out this week because of back pain.     04/26/15 0707 05/21/15 0905 06/18/15 1149       ITP Comments 30 day review  Continue with ITP  Khiley is out this week because of back pain. 30 day review  Continue with ITP  Mara remains out because of back pain. 30 day review. Continue with ITP. HAs been absent since last visit on 04/10/2015        Comments:

## 2015-06-18 NOTE — Telephone Encounter (Signed)
Called to check on status to return to program. LMOM 

## 2015-06-21 NOTE — Progress Notes (Signed)
Cardiac Individual Treatment Plan  Patient Details  Name: Karen Cervantes MRN: [id number] Date of Birth: 01/31/1955 Referring Provider:    Initial Encounter Date:       Cardiac Rehab from 01/17/2015 in [facility] Cardiac and Pulmonary Rehab   Date  01/17/15      Visit Diagnosis: S/P PTCA (percutaneous transluminal coronary angioplasty)  Patient's Home Medications on Admission:  Current outpatient prescriptions:  .  aspirin EC 81 MG tablet, Take 1 tablet (81 mg total) by mouth daily., Disp: 30 tablet, Rfl: 0 .  atorvastatin (LIPITOR) 80 MG tablet, Take 80 mg by mouth every evening., Disp: , Rfl:  .  carvedilol (COREG) 12.5 MG tablet, Take 1 tablet (12.5 mg total) by mouth 2 (two) times daily with a meal., Disp: 30 tablet, Rfl: 0 .  clopidogrel (PLAVIX) 75 MG tablet, Take 1 tablet (75 mg total) by mouth daily., Disp: 30 tablet, Rfl: 0 .  cyclobenzaprine (FLEXERIL) 10 MG tablet, Take 10 mg by mouth at bedtime., Disp: , Rfl:  .  ezetimibe (ZETIA) 10 MG tablet, Take 10 mg by mouth daily., Disp: , Rfl:  .  fenofibrate (TRICOR) 48 MG tablet, Take 1 tablet (48 mg total) by mouth daily., Disp: 30 tablet, Rfl: 6 .  FOLIC ACID PO, Take 1 tablet by mouth every morning. , Disp: , Rfl:  .  gabapentin (NEURONTIN) 300 MG capsule, Take 600 mg by mouth 2 (two) times daily. , Disp: , Rfl:  .  lidocaine (LIDODERM) 5 %, Place 1 patch onto the skin every 12 (twelve) hours. Remove & Discard patch within 12 hours or as directed by MD, Disp: 10 patch, Rfl: 0 .  lisinopril (PRINIVIL,ZESTRIL) 10 MG tablet, Take 1 tablet (10 mg total) by mouth daily., Disp: 30 tablet, Rfl: 0 .  Methotrexate, Anti-Rheumatic, (METHOTREXATE, PF, [place]), Inject 0.4 mLs into the skin once a week. Once a week, Disp: , Rfl:  .  Multiple Vitamins-Minerals (MULTIVITAMIN PO), Take 1 tablet by mouth every morning., Disp: , Rfl:  .  nitroGLYCERIN (NITROSTAT) 0.4 MG SL tablet, Place 1 tablet (0.4 mg total) under the tongue every 5 (five) minutes as  needed for chest pain., Disp: 25 tablet, Rfl: 3 .  omeprazole (PRILOSEC) 20 MG capsule, Take 20 mg by mouth 2 (two) times daily before a meal., Disp: , Rfl:  .  oxyCODONE-acetaminophen (PERCOCET) 10-325 MG tablet, Take 1 tablet by mouth every 6 (six) hours as needed for pain., Disp: , Rfl:  .  pantoprazole (PROTONIX) 40 MG tablet, Take 1 tablet (40 mg total) by mouth daily. (Patient not taking: Reported on 01/17/2015), Disp: 30 tablet, Rfl: 6 .  sucralfate (CARAFATE) 1 G tablet, Take 1 tablet (1 g total) by mouth 4 (four) times daily -  before meals and at bedtime. (Patient not taking: Reported on 01/02/2015), Disp: 60 tablet, Rfl: 0  Past Medical History: Past Medical History  Diagnosis Date  . CAD (coronary artery disease)     a. NSTEMI 04/2014; b. cardiac cath 05/30/2014: ost LAD 30%, mLAD 95% s/p PCI/DES, ost RCA 60%, pRCA 50%, dRCA 60%, EF >55% by echo;  c. 12/2014 NSTEMI/PCI: LM nl, LAD 30ost, patent mid stent, D1 small, D2 min dzs, D3 small, LCX min irregs, OM1 small, OM2 min irregs, OM3 70, RCA 60ost, 40p, 38m28m/d, 70d (3.25x28 Xience Alpine DES), RPDA min irregs, EF 55-65%.  .Marland KitchenHLD (hyperlipidemia)   . Obesity   . RA [place]oid [place]) [place]   . Diastolic dysfunction  a. echo 04/2014: EF >20%, no WMA, diastolic dysfunction, no valvular abnormalities, normal RVSP;  b. 12/2014 EF 55-65% by LV gram.  . Hypertension     Tobacco Use: History  Smoking status  . Former Smoker  Smokeless tobacco  . Not on file    Labs: Recent Review Flowsheet Data    Labs for ITP Cardiac and Pulmonary Rehab Latest Ref Rng 05/27/2014 05/28/2014 01/02/2015   Cholestrol - - 324(H) -   LDLCALC - - SEE COMMENT -   HDL - - 36(L) -   Trlycerides - - 658(H) -   Hemoglobin A1c 4.0 - 6.0 % 7.3(H) - 6.3(H)       Exercise Target Goals:    Exercise Program Goal: Individual exercise prescription set with THRR, safety & activity barriers. Participant demonstrates ability to understand and report RPE  using BORG scale, to self-measure pulse accurately, and to acknowledge the importance of the exercise prescription.  Exercise Prescription Goal: Starting with aerobic activity 30 plus minutes a day, 3 days per week for initial exercise prescription. Provide home exercise prescription and guidelines that participant acknowledges understanding prior to discharge.  Activity Barriers & Risk Stratification:     Activity Barriers & Cardiac Risk Stratification - 01/17/15 0847    Activity Barriers & Cardiac Risk Stratification   Activity Barriers Arthritis;Back Problems;Joint Problems  Has sciatia, gets injections for this. Feet hurt all the time, wearing shoes does help reduce the foot pain.  Arthritis , hands, shoulders, jaw, neck. Limited movement beacaue of the arthritis   Cardiac Risk Stratification High      6 Minute Walk:     6 Minute Walk      01/17/15 1133       6 Minute Walk   Phase Initial     Distance 923 feet     Walk Time 5.25 minutes     RPE 13     Symptoms No     Resting HR 56 bpm     Resting BP 152/84 mmHg     Max Ex. HR 97 bpm     Max Ex. BP 154/76 mmHg        Initial Exercise Prescription:     Initial Exercise Prescription - 01/17/15 1100    Date of Initial Exercise RX and Referring Provider   Date 01/17/15   Treadmill   MPH 1.3   Grade 0   Minutes 5  Use 5 minute intervals ie 2 x 5 min with rest inbetween   Bike   Level 0.2   Minutes 10   Recumbant Bike   Level 2   RPM 40   Watts 15   Minutes 10   NuStep   Level 2   Watts 20   Minutes 15   Arm Ergometer   Level 1   Watts 8   Minutes 10   Arm/Foot Ergometer   Level 4   Watts 15   Minutes 10   Cybex   Level 1   RPM 45   Minutes 10   Recumbant Elliptical   Level 1   RPM 40   Watts 10   Minutes 10   REL-XR   Level 2   Watts 30   Minutes 10   T5 Nustep   Level 1   Watts 10   Minutes 15   Biostep-RELP   Level 2   Watts 15   Minutes 15   Prescription Details   Frequency  (times per week) 3  Duration Progress to 30 minutes of continuous aerobic without signs/symptoms of physical distress   Intensity   THRR REST +  30   Ratings of Perceived Exertion 11-15   Progression   Progression Continue progressive overload as per policy without signs/symptoms or physical distress.   Resistance Training   Training Prescription Yes   Weight 2   Reps 10-15      Perform Capillary Blood Glucose checks as needed.  Exercise Prescription Changes:     Exercise Prescription Changes      01/17/15 0900 01/24/15 0700 02/14/15 0700 02/27/15 0800 03/01/15 0900   Exercise Review   Progression  No  Has not started program yet, med review was 01/17/15 No  Has not started program yet, med review was 01/17/15 No Yes   Response to Exercise   Blood Pressure (Admit) 152/84 mmHg       Blood Pressure (Exercise) 154/76 mmHg       Blood Pressure (Exit) 138/72 mmHg       Heart Rate (Admit) 54 bpm       Heart Rate (Exercise) 96 bpm       Heart Rate (Exit) 62 bpm       Rating of Perceived Exertion (Exercise) 13       Symptoms no cardiac symptoms       Comments HAD to stop for sciatica pain  which resolved iwth rest   Today was the patient's first day of class. Initial exercise prescription was reviewed with patient and she was able to tolerate it without signs and symptoms. Equipment safety and class procedures were also reviewed with the patient.     Treadmill   MPH (read-only)     1.4   Grade (read-only)     15   Minutes (read-only)     0   NuStep   Level (read-only)     3   Watts (read-only)     35   Minutes (read-only)     15     03/08/15 0800 03/29/15 0800 04/10/15 1100       Exercise Review   Progression Yes Yes Yes     Response to Exercise   Blood Pressure (Admit) 118/64 mmHg -- 124/68 mmHg     Blood Pressure (Exercise) 130/62 mmHg -- 152/68 mmHg     Blood Pressure (Exit) 94/56 mmHg -- 112/68 mmHg     Heart Rate (Admit) 72 bpm -- 75 bpm     Heart Rate (Exercise) 83  bpm -- 92 bpm     Heart Rate (Exit) 71 bpm -- 71 bpm     Rating of Perceived Exertion (Exercise) 12 -- 13     Symptoms None -- None     Comments Reviewed individualized exercise prescription and made increases per departmental policy. Exercise increases were discussed with the patient and they were able to perform the new work loads without issue (no signs or symptoms).  -- Hallee has increased in stamina and can now exercise for the entire 45 minutes of alloted aerobic exercise time. We will now focus on increases in level.      Duration Progress to 50 minutes of aerobic without signs/symptoms of physical distress -- Progress to 50 minutes of aerobic without signs/symptoms of physical distress     Intensity Rest + 30 -- Rest + 30     Progression   Progression Continue progressive overload as per policy without signs/symptoms or physical distress. -- Continue progressive overload as per policy without signs/symptoms  or physical distress.     Resistance Training   Training Prescription   Yes     Weight   2     Reps   10-15     Training Prescription (read-only) Yes       Weight (read-only) 2       Reps (read-only) 10-15       Treadmill   MPH  1.8 2     Grade  0 0     Minutes  20 20     MPH (read-only) 1.6       Grade (read-only) 15       Minutes (read-only) 0       NuStep   Level   4     Watts   40     Minutes   20     Level (read-only) 4       Watts (read-only) 40       Minutes (read-only) 20       REL-XR   Level  4 4     Watts  50 50     Minutes  20 20     Biostep-RELP   Level   5     Watts   35     Minutes   20        Exercise Comments:     Exercise Comments      05/17/15 6237 06/14/15 0647         Exercise Comments The patient has not attended class since the last review. There has been no progress made due to lack of attendance. Workloads may need to be adjusted if the patient returns to class.  The patient has not attended class since the last review. There has  been no progress made due to lack of attendance. Workloads may need to be adjusted if the patient returns to class.          Discharge Exercise Prescription (Final Exercise Prescription Changes):     Exercise Prescription Changes - 04/10/15 1100    Exercise Review   Progression Yes   Response to Exercise   Blood Pressure (Admit) 124/68 mmHg   Blood Pressure (Exercise) 152/68 mmHg   Blood Pressure (Exit) 112/68 mmHg   Heart Rate (Admit) 75 bpm   Heart Rate (Exercise) 92 bpm   Heart Rate (Exit) 71 bpm   Rating of Perceived Exertion (Exercise) 13   Symptoms None   Comments Judieth has increased in stamina and can now exercise for the entire 45 minutes of alloted aerobic exercise time. We will now focus on increases in level.    Duration Progress to 50 minutes of aerobic without signs/symptoms of physical distress   Intensity Rest + 30   Progression   Progression Continue progressive overload as per policy without signs/symptoms or physical distress.   Resistance Training   Training Prescription Yes   Weight 2   Reps 10-15   Treadmill   MPH 2   Grade 0   Minutes 20   NuStep   Level 4   Watts 40   Minutes 20   REL-XR   Level 4   Watts 50   Minutes 20   Biostep-RELP   Level 5   Watts 35   Minutes 20      Nutrition:  Target Goals: Understanding of nutrition guidelines, daily intake of sodium <1589m, cholesterol <2020m calories 30% from fat and 7% or less from saturated fats, daily to have 5 or more  servings of fruits and vegetables.  Biometrics:     Pre Biometrics - 01/17/15 1133    Pre Biometrics   Height 5' 2.25" (1.581 m)   Weight 191 lb 3.2 oz (86.728 kg)   Waist Circumference 38 inches   Hip Circumference 48 inches   Waist to Hip Ratio 0.79 %   BMI (Calculated) 34.8       Nutrition Therapy Plan and Nutrition Goals:     Nutrition Therapy & Goals - 01/17/15 0851    Intervention Plan   Intervention (read-only) Using nutrition plan and personal goals to  gain a healthy nutrition lifestyle. Add exercise as prescribed.      Nutrition Discharge: Rate Your Plate Scores:   Nutrition Goals Re-Evaluation:   Psychosocial: Target Goals: Acknowledge presence or absence of depression, maximize coping skills, provide positive support system. Participant is able to verbalize types and ability to use techniques and skills needed for reducing stress and depression.  Initial Review & Psychosocial Screening:     Initial Psych Review & Screening - 01/17/15 0906    Family Dynamics   Good Support System? Yes   Barriers   Psychosocial barriers to participate in program There are no identifiable barriers or psychosocial needs.;The patient should benefit from training in stress management and relaxation.   Screening Interventions   Interventions Encouraged to exercise      Quality of Life Scores:     Quality of Life - 01/17/15 1227    Quality of Life Scores   Health/Function Pre 26.5 %   Socioeconomic Pre 30 %   Psych/Spiritual Pre 28.29 %   Family Pre 30 %   GLOBAL Pre 28.26 %      PHQ-9:     Recent Review Flowsheet Data    Depression screen [facility] 2/9 01/17/2015   Decreased Interest 0   Down, Depressed, Hopeless 0   PHQ - 2 Score 0   Altered sleeping 0   Tired, decreased energy 0   Change in appetite 1    Feeling bad or failure about yourself  0   Trouble concentrating 0   Moving slowly or fidgety/restless 0   Suicidal thoughts 0   PHQ-9 Score 1   Difficult doing work/chores Somewhat difficult      Psychosocial Evaluation and Intervention:     Psychosocial Evaluation - 02/27/15 0954    Psychosocial Evaluation & Interventions   Interventions Encouraged to exercise with the program and follow exercise prescription;Relaxation education   Comments Counselor met with Ms. Eulas Post for initial psychosocial evaluation.  She is a 60 year old well-adjusted female who had (2) stents inserted in April and December of this past year.  She  has a strong support system with a spouse of 42 years and (5) adult daughters, several of which live close by.  She has several health issues with osteoarthritis and Rheumatoid Arthritis in addition to hr cardiac issues.  She reports only sleeping 4 hours per night and has tried several OTC aids to help with this unsuccessfully.  She states she has lost some weight as her appetite has decreased.  She denies a history of depression or anxiety or current symptoms.  Ms. Joynt is reportedly typically a positive person who has minimal stress in her life at this time.  Her goals for this program are weight loss, walk without as much pain and increased stamina.  Counselor recommended speaking with her doctor or pharmacist about sustained release  natural sleep aids to help with this  issue.  Counselor will be following with Ms. Havey in the future.     Continued Psychosocial Services Needed Yes  Counselor recommends Ms. Folker speak with her doctor or pharmacist re: OTC extended release sleep aids as an option to address her limited and intermittent sleep.  She will also benefit from meeting with the dietician to address her weight loss goals.        Psychosocial Re-Evaluation:     Psychosocial Re-Evaluation      03/29/15 0947           Psychosocial Re-Evaluation   Comments Follow up with Ms. Murin stating she has begun the OTC sleep aid with some success initially.   Then she had a steroid infusion which has interrupted her sleep again.  She hopes to return to improved sleep once this wears off.  She continues to work out and stay active.  Counselor commended her on her commitment and progress.            Vocational Rehabilitation: Provide vocational rehab assistance to qualifying candidates.   Vocational Rehab Evaluation & Intervention:     Vocational Rehab - 01/17/15 0849    Initial Vocational Rehab Evaluation & Intervention   Assessment shows need for Vocational Rehabilitation No       Education: Education Goals: Education classes will be provided on a weekly basis, covering required topics. Participant will state understanding/return demonstration of topics presented.  Learning Barriers/Preferences:     Learning Barriers/Preferences - 01/17/15 0849    Learning Barriers/Preferences   Learning Barriers None   Learning Preferences Written Material      Education Topics: General Nutrition Guidelines/Fats and Fiber: -Group instruction provided by verbal, written material, models and posters to present the general guidelines for heart healthy nutrition. Gives an explanation and review of dietary fats and fiber.   Controlling Sodium/Reading Food Labels: -Group verbal and written material supporting the discussion of sodium use in heart healthy nutrition. Review and explanation with models, verbal and written materials for utilization of the food label.   Exercise Physiology & Risk Factors: - Group verbal and written instruction with models to review the exercise physiology of the cardiovascular system and associated critical values. Details cardiovascular disease risk factors and the goals associated with each risk factor.   Aerobic Exercise & Resistance Training: - Gives group verbal and written discussion on the health impact of inactivity. On the components of aerobic and resistive training programs and the benefits of this training and how to safely progress through these programs.          Cardiac Rehab from 04/10/2015 in [facility] Cardiac and Pulmonary Rehab   Date  04/03/15   Educator  [facility]   Instruction Review Code  2- meets goals/outcomes      Flexibility, Balance, General Exercise Guidelines: - Provides group verbal and written instruction on the benefits of flexibility and balance training programs. Provides general exercise guidelines with specific guidelines to those with heart or lung disease. Demonstration and skill practice provided.      Cardiac  Rehab from 04/10/2015 in [facility] Cardiac and Pulmonary Rehab   Date  04/03/15   Educator  [facility]   Instruction Review Code  2- meets goals/outcomes      Stress Management: - Provides group verbal and written instruction about the health risks of elevated stress, cause of high stress, and healthy ways to reduce stress.   Depression: - Provides group verbal and written instruction on the correlation between heart/lung disease and  depressed mood, treatment options, and the stigmas associated with seeking treatment.      Cardiac Rehab from 04/10/2015 in [facility] Cardiac and Pulmonary Rehab   Date  03/22/15   Educator  [facility]   Instruction Review Code  2- meets goals/outcomes      Anatomy & Physiology of the Heart: - Group verbal and written instruction and models provide basic cardiac anatomy and physiology, with the coronary electrical and arterial systems. Review of: AMI, Angina, Valve disease, Heart Failure, Cardiac Arrhythmia, Pacemakers, and the ICD.      Cardiac Rehab from 04/10/2015 in [facility] Cardiac and Pulmonary Rehab   Date  04/10/15   Educator  [facility]   Instruction Review Code  2- meets goals/outcomes      Cardiac Procedures: - Group verbal and written instruction and models to describe the testing methods done to diagnose heart disease. Reviews the outcomes of the test results. Describes the treatment choices: Medical Management, Angioplasty, or Coronary Bypass Surgery.   Cardiac Medications: - Group verbal and written instruction to review commonly prescribed medications for heart disease. Reviews the medication, class of the drug, and side effects. Includes the steps to properly store meds and maintain the prescription regimen.      Cardiac Rehab from 04/10/2015 in [facility] Cardiac and Pulmonary Rehab   Date  03/08/15   Educator  SB   Instruction Review Code  2- meets goals/outcomes      Go Sex-Intimacy & Heart Disease, Get SMART - Goal Setting: - Group verbal and written instruction through  game format to discuss heart disease and the return to sexual intimacy. Provides group verbal and written material to discuss and apply goal setting through the application of the S.M.A.R.T. Method.   Other Matters of the Heart: - Provides group verbal, written materials and models to describe Heart Failure, Angina, Valve Disease, and Diabetes in the realm of heart disease. Includes description of the disease process and treatment options available to the cardiac patient.      Cardiac Rehab from 04/10/2015 in [facility] Cardiac and Pulmonary Rehab   Date  02/27/15   Educator  SB   Instruction Review Code  2- meets goals/outcomes      Exercise & Equipment Safety: - Individual verbal instruction and demonstration of equipment use and safety with use of the equipment.      Cardiac Rehab from 04/10/2015 in [facility] Cardiac and Pulmonary Rehab   Date  01/17/15   Educator  Sb   Instruction Review Code  2- meets goals/outcomes      Infection Prevention: - Provides verbal and written material to individual with discussion of infection control including proper hand washing and proper equipment cleaning during exercise session.      Cardiac Rehab from 04/10/2015 in [facility] Cardiac and Pulmonary Rehab   Date  01/17/15   Educator  Sb   Instruction Review Code  2- meets goals/outcomes      Falls Prevention: - Provides verbal and written material to individual with discussion of falls prevention and safety.      Cardiac Rehab from 04/10/2015 in [facility] Cardiac and Pulmonary Rehab   Date  01/17/15   Educator  SB   Instruction Review Code  2- meets goals/outcomes      Diabetes: - Individual verbal and written instruction to review signs/symptoms of diabetes, desired ranges of glucose level fasting, after meals and with exercise. Advice that pre and post exercise glucose checks will be done for 3 sessions at entry of program.  Knowledge Questionnaire Score:     Knowledge Questionnaire Score - 01/17/15  0849    Knowledge Questionnaire Score   Pre Score 23/28      Core Components/Risk Factors/Patient Goals at Admission:     Personal Goals and Risk Factors at Admission - 01/17/15 0851    Core Components/Risk Factors/Patient Goals on Admission    Weight Management Obesity;Yes   Intervention (read-only) Learn and follow the exercise and diet guidelines while in the program. Utilize the nutrition and education classes to help gain knowledge of the diet and exercise expectations in the program   Intervention (read-only) Provide weight management tools through evaluation completed by registered dietician and exercise physiologist.  Establish a goal weight with participant.   Admit Weight 193 lb (87.544 kg)   Goal Weight: Short Term 140 lb (63.504 kg)   Sedentary Yes;Sedentary  Not able to get up from floor because of knees. Both knees have osteoarthritis and Zniyah is waitnig to be set up for knee replacements.  Rheumatiod arthritis in other joints.  Feet hurt  constantly because of shifting of bones created by the arthritis..   Intervention (read-only) While in program, learn and follow the exercise prescription taught. Start at a low level workload and increase workload after able to maintain previous level for 30 minutes. Increase time before increasing intensity.   Intervention (read-only) Provide exercise education and an individualized exercise prescription that will provide continued progressive overload as per policy without signs/symptoms of physical distress.   Tobacco Cessation --  Quit 20 + years ago   Hypertension Yes   Goal Participant will see blood pressure controlled within the values of 140/45m/Hg or within value directed by their physician.   Intervention (read-only) Provide nutrition & aerobic exercise along with prescribed medications to achieve BP 140/90 or less.   Lipids Yes   Goal Cholesterol controlled with medications as prescribed, with individualized exercise RX and  with personalized nutrition plan. Value goals: LDL < '70mg'$ , HDL > '40mg'$ . Participant states understanding of desired cholesterol values and following prescriptions.   Intervention (read-only) Provide nutrition & aerobic exercise along with prescribed medications to achieve LDL '70mg'$ , HDL >'40mg'$ .      Core Components/Risk Factors/Patient Goals Review:      Goals and Risk Factor Review      03/13/15 1058 04/20/15 1000         Core Components/Risk Factors/Patient Goals Review   Personal Goals Review Increase Aerobic Exercise and Physical Activity Weight Management/Obesity;Hypertension;Lipids      Review  Janisa ahs had sporadic visits for varied reasons. When she is here in class she does not report cardiac symptoms. Her weight remains steady and her BP readings are in good range and well controlled.       Expected Outcomes  Continue to follow exercise and nutriotn guidelines to maintian control of risk factors and to work on weight loss. Expected goal for weight loss is 2-5 pounds while in the program.      Increase Aerobic Exercise and Physical Activity (read-only)   Goals Progress/Improvement seen  Yes       Comments Luberta is progressing well in the program and has already increased workloads on both the TM and NS. She said she is already noticing that her body is adjusting to the exercise and she can do more and more each time. Princesa can successfully exercise for the entire 30-45 minutes of aerobic exericse and in order to continue progression, we will focus on increases in intensity. We  will follow up with her in a few weeks about beginning interval training and further workload increases on the machines.           Core Components/Risk Factors/Patient Goals at Discharge (Final Review):      Goals and Risk Factor Review - 04/20/15 1000    Core Components/Risk Factors/Patient Goals Review   Personal Goals Review Weight Management/Obesity;Hypertension;Lipids   Review Jenya ahs had sporadic  visits for varied reasons. When she is here in class she does not report cardiac symptoms. Her weight remains steady and her BP readings are in good range and well controlled.    Expected Outcomes Continue to follow exercise and nutriotn guidelines to maintian control of risk factors and to work on weight loss. Expected goal for weight loss is 2-5 pounds while in the program.      ITP Comments:     ITP Comments      01/17/15 0851 01/29/15 1204 02/23/15 1120 03/21/15 0741 04/20/15 0959   ITP Comments Initial orientation and ITP. Continue with ITP Ready for 30 day review.  Continue with ITP Ready for 30 day review. Continue with ITP.  Paeton ahs been absent this month becuse of car trouble and family health concners.  She plans to return next week 30 day review.  Continue with ITP.  Lauryl is attending program. Sporadic attendance.  30 day review  Continue with ITP  Kenyotta is out this week because of back pain.     04/26/15 0707 05/21/15 0905 06/18/15 1149 06/21/15 0637     ITP Comments 30 day review  Continue with ITP  Jazilyn is out this week because of back pain. 30 day review  Continue with ITP  Darothy remains out because of back pain. 30 day review. Continue with ITP. HAs been absent since last visit on 04/10/2015 Briella returned call.  She continues to have medical issues and plans to return once problems are resolved.       Comments:

## 2015-07-12 ENCOUNTER — Encounter: Payer: Self-pay | Admitting: *Deleted

## 2015-07-18 ENCOUNTER — Telehealth: Payer: Self-pay | Admitting: *Deleted

## 2015-07-18 NOTE — Telephone Encounter (Signed)
Called to check on status to return to program. LMOM 

## 2015-07-19 ENCOUNTER — Encounter: Payer: Self-pay | Admitting: *Deleted

## 2015-07-19 DIAGNOSIS — Z9861 Coronary angioplasty status: Secondary | ICD-10-CM

## 2015-07-19 NOTE — Progress Notes (Signed)
Cardiac Individual Treatment Plan  Patient Details  Name: Karen Cervantes MRN: [id number] Date of Birth: 04/22/1955 Referring Provider:    Initial Encounter Date:       Cardiac Rehab from 01/17/2015 in [facility] Cardiac and Pulmonary Rehab   Date  01/17/15      Visit Diagnosis: S/P PTCA (percutaneous transluminal coronary angioplasty)  Patient's Home Medications on Admission:  Current outpatient prescriptions:  .  aspirin EC 81 MG tablet, Take 1 tablet (81 mg total) by mouth daily., Disp: 30 tablet, Rfl: 0 .  atorvastatin (LIPITOR) 80 MG tablet, Take 80 mg by mouth every evening., Disp: , Rfl:  .  carvedilol (COREG) 12.5 MG tablet, Take 1 tablet (12.5 mg total) by mouth 2 (two) times daily with a meal., Disp: 30 tablet, Rfl: 0 .  clopidogrel (PLAVIX) 75 MG tablet, Take 1 tablet (75 mg total) by mouth daily., Disp: 30 tablet, Rfl: 0 .  cyclobenzaprine (FLEXERIL) 10 MG tablet, Take 10 mg by mouth at bedtime., Disp: , Rfl:  .  ezetimibe (ZETIA) 10 MG tablet, Take 10 mg by mouth daily., Disp: , Rfl:  .  fenofibrate (TRICOR) 48 MG tablet, Take 1 tablet (48 mg total) by mouth daily., Disp: 30 tablet, Rfl: 6 .  FOLIC ACID PO, Take 1 tablet by mouth every morning. , Disp: , Rfl:  .  gabapentin (NEURONTIN) 300 MG capsule, Take 600 mg by mouth 2 (two) times daily. , Disp: , Rfl:  .  lidocaine (LIDODERM) 5 %, Place 1 patch onto the skin every 12 (twelve) hours. Remove & Discard patch within 12 hours or as directed by MD, Disp: 10 patch, Rfl: 0 .  lisinopril (PRINIVIL,ZESTRIL) 10 MG tablet, Take 1 tablet (10 mg total) by mouth daily., Disp: 30 tablet, Rfl: 0 .  Methotrexate, Anti-Rheumatic, (METHOTREXATE, PF, Rudolph), Inject 0.4 mLs into the skin once a week. Once a week, Disp: , Rfl:  .  Multiple Vitamins-Minerals (MULTIVITAMIN PO), Take 1 tablet by mouth every morning., Disp: , Rfl:  .  nitroGLYCERIN (NITROSTAT) 0.4 MG SL tablet, Place 1 tablet (0.4 mg total) under the tongue every 5 (five) minutes as  needed for chest pain., Disp: 25 tablet, Rfl: 3 .  omeprazole (PRILOSEC) 20 MG capsule, Take 20 mg by mouth 2 (two) times daily before a meal., Disp: , Rfl:  .  oxyCODONE-acetaminophen (PERCOCET) 10-325 MG tablet, Take 1 tablet by mouth every 6 (six) hours as needed for pain., Disp: , Rfl:  .  pantoprazole (PROTONIX) 40 MG tablet, Take 1 tablet (40 mg total) by mouth daily. (Patient not taking: Reported on 01/17/2015), Disp: 30 tablet, Rfl: 6 .  sucralfate (CARAFATE) 1 G tablet, Take 1 tablet (1 g total) by mouth 4 (four) times daily -  before meals and at bedtime. (Patient not taking: Reported on 01/02/2015), Disp: 60 tablet, Rfl: 0  Past Medical History: Past Medical History  Diagnosis Date  . CAD (coronary artery disease)     a. NSTEMI 04/2014; b. cardiac cath 05/30/2014: ost LAD 30%, mLAD 95% s/p PCI/DES, ost RCA 60%, pRCA 50%, dRCA 60%, EF >55% by echo;  c. 12/2014 NSTEMI/PCI: LM nl, LAD 30ost, patent mid stent, D1 small, D2 min dzs, D3 small, LCX min irregs, OM1 small, OM2 min irregs, OM3 70, RCA 60ost, 40p, 59m40m/d, 70d (3.25x28 Xience Alpine DES), RPDA min irregs, EF 55-65%.  .Marland KitchenHLD (hyperlipidemia)   . Obesity   . RA (rheumatoid arthritis) (HCaptain Cook   . Diastolic dysfunction  a. echo 04/2014: EF >73%, no WMA, diastolic dysfunction, no valvular abnormalities, normal RVSP;  b. 12/2014 EF 55-65% by LV gram.  . Hypertension     Tobacco Use: History  Smoking status  . Former Smoker  Smokeless tobacco  . Not on file    Labs: Recent Review Flowsheet Data    Labs for ITP Cardiac and Pulmonary Rehab Latest Ref Rng 05/27/2014 05/28/2014 01/02/2015   Cholestrol - - 324(H) -   LDLCALC - - SEE COMMENT -   HDL - - 36(L) -   Trlycerides - - 658(H) -   Hemoglobin A1c 4.0 - 6.0 % 7.3(H) - 6.3(H)       Exercise Target Goals:    Exercise Program Goal: Individual exercise prescription set with THRR, safety & activity barriers. Participant demonstrates ability to understand and report RPE  using BORG scale, to self-measure pulse accurately, and to acknowledge the importance of the exercise prescription.  Exercise Prescription Goal: Starting with aerobic activity 30 plus minutes a day, 3 days per week for initial exercise prescription. Provide home exercise prescription and guidelines that participant acknowledges understanding prior to discharge.  Activity Barriers & Risk Stratification:   6 Minute Walk:   Initial Exercise Prescription:   Perform Capillary Blood Glucose checks as needed.  Exercise Prescription Changes:     Exercise Prescription Changes      01/24/15 0700 02/14/15 0700 02/27/15 0800 03/01/15 0900 03/08/15 0800   Exercise Review   Progression No  Has not started program yet, med review was 01/17/15 No  Has not started program yet, med review was 01/17/15 No Yes Yes   Response to Exercise   Blood Pressure (Admit)     118/64 mmHg   Blood Pressure (Exercise)     130/62 mmHg   Blood Pressure (Exit)     94/56 mmHg   Heart Rate (Admit)     72 bpm   Heart Rate (Exercise)     83 bpm   Heart Rate (Exit)     71 bpm   Rating of Perceived Exertion (Exercise)     12   Symptoms     None   Comments   Today was the patient's first day of class. Initial exercise prescription was reviewed with patient and she was able to tolerate it without signs and symptoms. Equipment safety and class procedures were also reviewed with the patient.   Reviewed individualized exercise prescription and made increases per departmental policy. Exercise increases were discussed with the patient and they were able to perform the new work loads without issue (no signs or symptoms).    Duration     Progress to 50 minutes of aerobic without signs/symptoms of physical distress   Intensity     Rest + 30   Progression   Progression     Continue progressive overload as per policy without signs/symptoms or physical distress.   Resistance Training   Training Prescription (read-only)     Yes    Weight (read-only)     2   Reps (read-only)     10-15   Treadmill   MPH (read-only)    1.4 1.6   Grade (read-only)    15 15   Minutes (read-only)    0 0   NuStep   Level (read-only)    3 4   Watts (read-only)    35 40   Minutes (read-only)    15 20     03/29/15 0800 04/10/15 1100  Exercise Review   Progression Yes Yes      Response to Exercise   Blood Pressure (Admit) -- 124/68 mmHg      Blood Pressure (Exercise) -- 152/68 mmHg      Blood Pressure (Exit) -- 112/68 mmHg      Heart Rate (Admit) -- 75 bpm      Heart Rate (Exercise) -- 92 bpm      Heart Rate (Exit) -- 71 bpm      Rating of Perceived Exertion (Exercise) -- 13      Symptoms -- None      Comments -- Akeyla has increased in stamina and can now exercise for the entire 45 minutes of alloted aerobic exercise time. We will now focus on increases in level.       Duration -- Progress to 50 minutes of aerobic without signs/symptoms of physical distress      Intensity -- Rest + 30      Progression   Progression -- Continue progressive overload as per policy without signs/symptoms or physical distress.      Resistance Training   Training Prescription  Yes      Weight  2      Reps  10-15      Treadmill   MPH 1.8 2      Grade 0 0      Minutes 20 20      NuStep   Level  4      Watts  40      Minutes  20      REL-XR   Level 4 4      Watts 50 50      Minutes 20 20      Biostep-RELP   Level  5      Watts  35      Minutes  20         Exercise Comments:     Exercise Comments      05/17/15 4818 06/14/15 0647 07/12/15 1510       Exercise Comments The patient has not attended class since the last review. There has been no progress made due to lack of attendance. Workloads may need to be adjusted if the patient returns to class.  The patient has not attended class since the last review. There has been no progress made due to lack of attendance. Workloads may need to be adjusted if the patient returns to class.   Pt has been out since last medical review.        Discharge Exercise Prescription (Final Exercise Prescription Changes):     Exercise Prescription Changes - 04/10/15 1100    Exercise Review   Progression Yes   Response to Exercise   Blood Pressure (Admit) 124/68 mmHg   Blood Pressure (Exercise) 152/68 mmHg   Blood Pressure (Exit) 112/68 mmHg   Heart Rate (Admit) 75 bpm   Heart Rate (Exercise) 92 bpm   Heart Rate (Exit) 71 bpm   Rating of Perceived Exertion (Exercise) 13   Symptoms None   Comments Chaitra has increased in stamina and can now exercise for the entire 45 minutes of alloted aerobic exercise time. We will now focus on increases in level.    Duration Progress to 50 minutes of aerobic without signs/symptoms of physical distress   Intensity Rest + 30   Progression   Progression Continue progressive overload as per policy without signs/symptoms or physical distress.   Resistance Training   Training Prescription Yes  Weight 2   Reps 10-15   Treadmill   MPH 2   Grade 0   Minutes 20   NuStep   Level 4   Watts 40   Minutes 20   REL-XR   Level 4   Watts 50   Minutes 20   Biostep-RELP   Level 5   Watts 35   Minutes 20      Nutrition:  Target Goals: Understanding of nutrition guidelines, daily intake of sodium '1500mg'$ , cholesterol '200mg'$ , calories 30% from fat and 7% or less from saturated fats, daily to have 5 or more servings of fruits and vegetables.  Biometrics:    Nutrition Therapy Plan and Nutrition Goals:   Nutrition Discharge: Rate Your Plate Scores:   Nutrition Goals Re-Evaluation:   Psychosocial: Target Goals: Acknowledge presence or absence of depression, maximize coping skills, provide positive support system. Participant is able to verbalize types and ability to use techniques and skills needed for reducing stress and depression.  Initial Review & Psychosocial Screening:   Quality of Life Scores:   PHQ-9:     Recent Review  Flowsheet Data    Depression screen [facility] 2/9 01/17/2015   Decreased Interest 0   Down, Depressed, Hopeless 0   PHQ - 2 Score 0   Altered sleeping 0   Tired, decreased energy 0   Change in appetite 1    Feeling bad or failure about yourself  0   Trouble concentrating 0   Moving slowly or fidgety/restless 0   Suicidal thoughts 0   PHQ-9 Score 1   Difficult doing work/chores Somewhat difficult      Psychosocial Evaluation and Intervention:     Psychosocial Evaluation - 02/27/15 0954    Psychosocial Evaluation & Interventions   Interventions Encouraged to exercise with the program and follow exercise prescription;Relaxation education   Comments Counselor met with Ms. Eulas Post for initial psychosocial evaluation.  She is a 60 year old well-adjusted female who had (2) stents inserted in April and December of this past year.  She has a strong support system with a spouse of 6 years and (77) adult daughters, several of which live close by.  She has several health issues with osteoarthritis and Rheumatoid Arthritis in addition to hr cardiac issues.  She reports only sleeping 4 hours per night and has tried several OTC aids to help with this unsuccessfully.  She states she has lost some weight as her appetite has decreased.  She denies a history of depression or anxiety or current symptoms.  Ms. Gose is reportedly typically a positive person who has minimal stress in her life at this time.  Her goals for this program are weight loss, walk without as much pain and increased stamina.  Counselor recommended speaking with her doctor or pharmacist about sustained release  natural sleep aids to help with this issue.  Counselor will be following with Ms. Raven in the future.     Continued Psychosocial Services Needed Yes  Counselor recommends Ms. Cotten speak with her doctor or pharmacist re: OTC extended release sleep aids as an option to address her limited and intermittent sleep.  She will also benefit  from meeting with the dietician to address her weight loss goals.        Psychosocial Re-Evaluation:     Psychosocial Re-Evaluation      03/29/15 0947           Psychosocial Re-Evaluation   Comments Follow up with Ms. Ehinger stating she has  begun the OTC sleep aid with some success initially.   Then she had a steroid infusion which has interrupted her sleep again.  She hopes to return to improved sleep once this wears off.  She continues to work out and stay active.  Counselor commended her on her commitment and progress.            Vocational Rehabilitation: Provide vocational rehab assistance to qualifying candidates.   Vocational Rehab Evaluation & Intervention:   Education: Education Goals: Education classes will be provided on a weekly basis, covering required topics. Participant will state understanding/return demonstration of topics presented.  Learning Barriers/Preferences:   Education Topics: General Nutrition Guidelines/Fats and Fiber: -Group instruction provided by verbal, written material, models and posters to present the general guidelines for heart healthy nutrition. Gives an explanation and review of dietary fats and fiber.   Controlling Sodium/Reading Food Labels: -Group verbal and written material supporting the discussion of sodium use in heart healthy nutrition. Review and explanation with models, verbal and written materials for utilization of the food label.   Exercise Physiology & Risk Factors: - Group verbal and written instruction with models to review the exercise physiology of the cardiovascular system and associated critical values. Details cardiovascular disease risk factors and the goals associated with each risk factor.   Aerobic Exercise & Resistance Training: - Gives group verbal and written discussion on the health impact of inactivity. On the components of aerobic and resistive training programs and the benefits of this training and how  to safely progress through these programs.          Cardiac Rehab from 04/10/2015 in [facility] Cardiac and Pulmonary Rehab   Date  04/03/15   Educator  [facility]   Instruction Review Code  2- meets goals/outcomes      Flexibility, Balance, General Exercise Guidelines: - Provides group verbal and written instruction on the benefits of flexibility and balance training programs. Provides general exercise guidelines with specific guidelines to those with heart or lung disease. Demonstration and skill practice provided.      Cardiac Rehab from 04/10/2015 in [facility] Cardiac and Pulmonary Rehab   Date  04/03/15   Educator  [facility]   Instruction Review Code  2- meets goals/outcomes      Stress Management: - Provides group verbal and written instruction about the health risks of elevated stress, cause of high stress, and healthy ways to reduce stress.   Depression: - Provides group verbal and written instruction on the correlation between heart/lung disease and depressed mood, treatment options, and the stigmas associated with seeking treatment.      Cardiac Rehab from 04/10/2015 in [facility] Cardiac and Pulmonary Rehab   Date  03/22/15   Educator  [facility]   Instruction Review Code  2- meets goals/outcomes      Anatomy & Physiology of the Heart: - Group verbal and written instruction and models provide basic cardiac anatomy and physiology, with the coronary electrical and arterial systems. Review of: AMI, Angina, Valve disease, Heart Failure, Cardiac Arrhythmia, Pacemakers, and the ICD.      Cardiac Rehab from 04/10/2015 in [facility] Cardiac and Pulmonary Rehab   Date  04/10/15   Educator  SB   Instruction Review Code  2- meets goals/outcomes      Cardiac Procedures: - Group verbal and written instruction and models to describe the testing methods done to diagnose heart disease. Reviews the outcomes of the test results. Describes the treatment choices: Medical Management, Angioplasty, or Coronary Bypass  Surgery.   Cardiac Medications: - Group verbal and written instruction to review commonly prescribed medications for heart disease. Reviews the medication, class of the drug, and side effects. Includes the steps to properly store meds and maintain the prescription regimen.      Cardiac Rehab from 04/10/2015 in [facility] Cardiac and Pulmonary Rehab   Date  03/08/15   Educator  SB   Instruction Review Code  2- meets goals/outcomes      Go Sex-Intimacy & Heart Disease, Get SMART - Goal Setting: - Group verbal and written instruction through game format to discuss heart disease and the return to sexual intimacy. Provides group verbal and written material to discuss and apply goal setting through the application of the S.M.A.R.T. Method.   Other Matters of the Heart: - Provides group verbal, written materials and models to describe Heart Failure, Angina, Valve Disease, and Diabetes in the realm of heart disease. Includes description of the disease process and treatment options available to the cardiac patient.      Cardiac Rehab from 04/10/2015 in [facility] Cardiac and Pulmonary Rehab   Date  02/27/15   Educator  SB   Instruction Review Code  2- meets goals/outcomes      Exercise & Equipment Safety: - Individual verbal instruction and demonstration of equipment use and safety with use of the equipment.      Cardiac Rehab from 04/10/2015 in [facility] Cardiac and Pulmonary Rehab   Date  01/17/15   Educator  Sb   Instruction Review Code  2- meets goals/outcomes      Infection Prevention: - Provides verbal and written material to individual with discussion of infection control including proper hand washing and proper equipment cleaning during exercise session.      Cardiac Rehab from 04/10/2015 in [facility] Cardiac and Pulmonary Rehab   Date  01/17/15   Educator  Sb   Instruction Review Code  2- meets goals/outcomes      Falls Prevention: - Provides verbal and written material to individual with  discussion of falls prevention and safety.      Cardiac Rehab from 04/10/2015 in [facility] Cardiac and Pulmonary Rehab   Date  01/17/15   Educator  SB   Instruction Review Code  2- meets goals/outcomes      Diabetes: - Individual verbal and written instruction to review signs/symptoms of diabetes, desired ranges of glucose level fasting, after meals and with exercise. Advice that pre and post exercise glucose checks will be done for 3 sessions at entry of program.    Knowledge Questionnaire Score:   Core Components/Risk Factors/Patient Goals at Admission:   Core Components/Risk Factors/Patient Goals Review:      Goals and Risk Factor Review      03/13/15 1058 04/20/15 1000         Core Components/Risk Factors/Patient Goals Review   Personal Goals Review Increase Aerobic Exercise and Physical Activity Weight Management/Obesity;Hypertension;Lipids      Review  Willene ahs had sporadic visits for varied reasons. When she is here in class she does not report cardiac symptoms. Her weight remains steady and her BP readings are in good range and well controlled.       Expected Outcomes  Continue to follow exercise and nutriotn guidelines to maintian control of risk factors and to work on weight loss. Expected goal for weight loss is 2-5 pounds while in the program.      Increase Aerobic Exercise and Physical Activity (read-only)   Goals Progress/Improvement seen  Yes  Comments Kenslee is progressing well in the program and has already increased workloads on both the TM and NS. She said she is already noticing that her body is adjusting to the exercise and she can do more and more each time. Mabry can successfully exercise for the entire 30-45 minutes of aerobic exericse and in order to continue progression, we will focus on increases in intensity. We will follow up with her in a few weeks about beginning interval training and further workload increases on the machines.           Core  Components/Risk Factors/Patient Goals at Discharge (Final Review):      Goals and Risk Factor Review - 04/20/15 1000    Core Components/Risk Factors/Patient Goals Review   Personal Goals Review Weight Management/Obesity;Hypertension;Lipids   Review Shewanda ahs had sporadic visits for varied reasons. When she is here in class she does not report cardiac symptoms. Her weight remains steady and her BP readings are in good range and well controlled.    Expected Outcomes Continue to follow exercise and nutriotn guidelines to maintian control of risk factors and to work on weight loss. Expected goal for weight loss is 2-5 pounds while in the program.      ITP Comments:     ITP Comments      01/29/15 1204 02/23/15 1120 03/21/15 0741 04/20/15 0959 04/26/15 0707   ITP Comments Ready for 30 day review.  Continue with ITP Ready for 30 day review. Continue with ITP.  Dezaria ahs been absent this month becuse of car trouble and family health concners.  She plans to return next week 30 day review.  Continue with ITP.  Aiesha is attending program. Sporadic attendance.  30 day review  Continue with ITP  Sarayu is out this week because of back pain. 30 day review  Continue with ITP  Camilia is out this week because of back pain.     05/21/15 0905 06/18/15 1149 06/21/15 0637 07/12/15 1510 07/19/15 0659   ITP Comments 30 day review  Continue with ITP  [place] remains out because of back pain. 30 day review. Continue with ITP. HAs been absent since last visit on 04/10/2015 Kortny returned call.  She continues to have medical issues and plans to return once problems are resolved. Pt has been out since last medical review. 30 day review.  Continue with ITP  Remains out for medical concerns      Comments:

## 2015-08-09 ENCOUNTER — Encounter: Payer: Self-pay | Admitting: *Deleted

## 2015-08-09 DIAGNOSIS — Z9861 Coronary angioplasty status: Secondary | ICD-10-CM

## 2015-08-16 ENCOUNTER — Encounter: Payer: Self-pay | Admitting: *Deleted

## 2015-08-16 DIAGNOSIS — Z9861 Coronary angioplasty status: Secondary | ICD-10-CM

## 2015-08-16 NOTE — Progress Notes (Signed)
Cardiac Individual Treatment Plan  Patient Details  Name: Karen Cervantes MRN: [id number] Date of Birth: 04/22/1955 Referring Provider:    Initial Encounter Date:       Cardiac Rehab from 01/17/2015 in [facility] Cardiac and Pulmonary Rehab   Date  01/17/15      Visit Diagnosis: S/P PTCA (percutaneous transluminal coronary angioplasty)  Patient's Home Medications on Admission:  Current outpatient prescriptions:  .  aspirin EC 81 MG tablet, Take 1 tablet (81 mg total) by mouth daily., Disp: 30 tablet, Rfl: 0 .  atorvastatin (LIPITOR) 80 MG tablet, Take 80 mg by mouth every evening., Disp: , Rfl:  .  carvedilol (COREG) 12.5 MG tablet, Take 1 tablet (12.5 mg total) by mouth 2 (two) times daily with a meal., Disp: 30 tablet, Rfl: 0 .  clopidogrel (PLAVIX) 75 MG tablet, Take 1 tablet (75 mg total) by mouth daily., Disp: 30 tablet, Rfl: 0 .  cyclobenzaprine (FLEXERIL) 10 MG tablet, Take 10 mg by mouth at bedtime., Disp: , Rfl:  .  ezetimibe (ZETIA) 10 MG tablet, Take 10 mg by mouth daily., Disp: , Rfl:  .  fenofibrate (TRICOR) 48 MG tablet, Take 1 tablet (48 mg total) by mouth daily., Disp: 30 tablet, Rfl: 6 .  FOLIC ACID PO, Take 1 tablet by mouth every morning. , Disp: , Rfl:  .  gabapentin (NEURONTIN) 300 MG capsule, Take 600 mg by mouth 2 (two) times daily. , Disp: , Rfl:  .  lidocaine (LIDODERM) 5 %, Place 1 patch onto the skin every 12 (twelve) hours. Remove & Discard patch within 12 hours or as directed by MD, Disp: 10 patch, Rfl: 0 .  lisinopril (PRINIVIL,ZESTRIL) 10 MG tablet, Take 1 tablet (10 mg total) by mouth daily., Disp: 30 tablet, Rfl: 0 .  Methotrexate, Anti-Rheumatic, (METHOTREXATE, PF, [place]), Inject 0.4 mLs into the skin once a week. Once a week, Disp: , Rfl:  .  Multiple Vitamins-Minerals (MULTIVITAMIN PO), Take 1 tablet by mouth every morning., Disp: , Rfl:  .  nitroGLYCERIN (NITROSTAT) 0.4 MG SL tablet, Place 1 tablet (0.4 mg total) under the tongue every 5 (five) minutes as  needed for chest pain., Disp: 25 tablet, Rfl: 3 .  omeprazole (PRILOSEC) 20 MG capsule, Take 20 mg by mouth 2 (two) times daily before a meal., Disp: , Rfl:  .  oxyCODONE-acetaminophen (PERCOCET) 10-325 MG tablet, Take 1 tablet by mouth every 6 (six) hours as needed for pain., Disp: , Rfl:  .  pantoprazole (PROTONIX) 40 MG tablet, Take 1 tablet (40 mg total) by mouth daily. (Patient not taking: Reported on 01/17/2015), Disp: 30 tablet, Rfl: 6 .  sucralfate (CARAFATE) 1 G tablet, Take 1 tablet (1 g total) by mouth 4 (four) times daily -  before meals and at bedtime. (Patient not taking: Reported on 01/02/2015), Disp: 60 tablet, Rfl: 0  Past Medical History: Past Medical History  Diagnosis Date  . CAD (coronary artery disease)     a. NSTEMI 04/2014; b. cardiac cath 05/30/2014: ost LAD 30%, mLAD 95% s/p PCI/DES, ost RCA 60%, pRCA 50%, dRCA 60%, EF >55% by echo;  c. 12/2014 NSTEMI/PCI: LM nl, LAD 30ost, patent mid stent, D1 small, D2 min dzs, D3 small, LCX min irregs, OM1 small, OM2 min irregs, OM3 70, RCA 60ost, 40p, 59m40m/d, 70d (3.25x28 Xience Alpine DES), RPDA min irregs, EF 55-65%.  .Marland KitchenHLD (hyperlipidemia)   . Obesity   . RA (rheumatoid arthritis) (HCaptain Cook   . Diastolic dysfunction  a. echo 04/2014: EF >34%, no WMA, diastolic dysfunction, no valvular abnormalities, normal RVSP;  b. 12/2014 EF 55-65% by LV gram.  . Hypertension     Tobacco Use: History  Smoking status  . Former Smoker  Smokeless tobacco  . Not on file    Labs: Recent Review Flowsheet Data    Labs for ITP Cardiac and Pulmonary Rehab Latest Ref Rng 05/27/2014 05/28/2014 01/02/2015   Cholestrol - - 324(H) -   LDLCALC - - SEE COMMENT -   HDL - - 36(L) -   Trlycerides - - 658(H) -   Hemoglobin A1c 4.0 - 6.0 % 7.3(H) - 6.3(H)       Exercise Target Goals:    Exercise Program Goal: Individual exercise prescription set with THRR, safety & activity barriers. Participant demonstrates ability to understand and report RPE  using BORG scale, to self-measure pulse accurately, and to acknowledge the importance of the exercise prescription.  Exercise Prescription Goal: Starting with aerobic activity 30 plus minutes a day, 3 days per week for initial exercise prescription. Provide home exercise prescription and guidelines that participant acknowledges understanding prior to discharge.  Activity Barriers & Risk Stratification:   6 Minute Walk:   Initial Exercise Prescription:   Perform Capillary Blood Glucose checks as needed.  Exercise Prescription Changes:     Exercise Prescription Changes      02/27/15 0800 03/01/15 0900 03/08/15 0800 03/29/15 0800 04/10/15 1100   Exercise Review   Progression No Yes Yes Yes Yes   Response to Exercise   Blood Pressure (Admit)   118/64 mmHg -- 124/68 mmHg   Blood Pressure (Exercise)   130/62 mmHg -- 152/68 mmHg   Blood Pressure (Exit)   94/56 mmHg -- 112/68 mmHg   Heart Rate (Admit)   72 bpm -- 75 bpm   Heart Rate (Exercise)   83 bpm -- 92 bpm   Heart Rate (Exit)   71 bpm -- 71 bpm   Rating of Perceived Exertion (Exercise)   12 -- 13   Symptoms   None -- None   Comments Today was the patient's first day of class. Initial exercise prescription was reviewed with patient and she was able to tolerate it without signs and symptoms. Equipment safety and class procedures were also reviewed with the patient.   Reviewed individualized exercise prescription and made increases per departmental policy. Exercise increases were discussed with the patient and they were able to perform the new work loads without issue (no signs or symptoms).  -- Karen Cervantes has increased in stamina and can now exercise for the entire 45 minutes of alloted aerobic exercise time. We will now focus on increases in level.    Duration   Progress to 50 minutes of aerobic without signs/symptoms of physical distress -- Progress to 50 minutes of aerobic without signs/symptoms of physical distress   Intensity   Rest +  30 -- Rest + 30   Progression   Progression   Continue progressive overload as per policy without signs/symptoms or physical distress. -- Continue progressive overload as per policy without signs/symptoms or physical distress.   Resistance Training   Training Prescription     Yes   Weight     2   Reps     10-15   Training Prescription (read-only)   Yes     Weight (read-only)   2     Reps (read-only)   10-15     Treadmill   MPH    1.8 2  Grade    0 0   Minutes    20 20   MPH (read-only)  1.4 1.6     Grade (read-only)  15 15     Minutes (read-only)  0 0     NuStep   Level     4   Watts     40   Minutes     20   Level (read-only)  3 4     Watts (read-only)  35 40     Minutes (read-only)  15 20     REL-XR   Level    4 4   Watts    50 50   Minutes    20 20   Biostep-RELP   Level     5   Watts     35   Minutes     20      Exercise Comments:     Exercise Comments      05/17/15 1607 06/14/15 0647 07/12/15 1510 08/09/15 1552     Exercise Comments The patient has not attended class since the last review. There has been no progress made due to lack of attendance. Workloads may need to be adjusted if the patient returns to class.  The patient has not attended class since the last review. There has been no progress made due to lack of attendance. Workloads may need to be adjusted if the patient returns to class.  Pt has been out since last medical review. Pt has been out since last review.       Discharge Exercise Prescription (Final Exercise Prescription Changes):     Exercise Prescription Changes - 04/10/15 1100    Exercise Review   Progression Yes   Response to Exercise   Blood Pressure (Admit) 124/68 mmHg   Blood Pressure (Exercise) 152/68 mmHg   Blood Pressure (Exit) 112/68 mmHg   Heart Rate (Admit) 75 bpm   Heart Rate (Exercise) 92 bpm   Heart Rate (Exit) 71 bpm   Rating of Perceived Exertion (Exercise) 13   Symptoms None   Comments Aarin has increased in stamina  and can now exercise for the entire 45 minutes of alloted aerobic exercise time. We will now focus on increases in level.    Duration Progress to 50 minutes of aerobic without signs/symptoms of physical distress   Intensity Rest + 30   Progression   Progression Continue progressive overload as per policy without signs/symptoms or physical distress.   Resistance Training   Training Prescription Yes   Weight 2   Reps 10-15   Treadmill   MPH 2   Grade 0   Minutes 20   NuStep   Level 4   Watts 40   Minutes 20   REL-XR   Level 4   Watts 50   Minutes 20   Biostep-RELP   Level 5   Watts 35   Minutes 20      Nutrition:  Target Goals: Understanding of nutrition guidelines, daily intake of sodium <1516m, cholesterol <2066m calories 30% from fat and 7% or less from saturated fats, daily to have 5 or more servings of fruits and vegetables.  Biometrics:    Nutrition Therapy Plan and Nutrition Goals:   Nutrition Discharge: Rate Your Plate Scores:   Nutrition Goals Re-Evaluation:   Psychosocial: Target Goals: Acknowledge presence or absence of depression, maximize coping skills, provide positive support system. Participant is able to verbalize types and ability to use techniques and skills needed for  reducing stress and depression.  Initial Review & Psychosocial Screening:   Quality of Life Scores:   PHQ-9:     Recent Review Flowsheet Data    Depression screen [facility] 2/9 01/17/2015   Decreased Interest 0   Down, Depressed, Hopeless 0   PHQ - 2 Score 0   Altered sleeping 0   Tired, decreased energy 0   Change in appetite 1    Feeling bad or failure about yourself  0   Trouble concentrating 0   Moving slowly or fidgety/restless 0   Suicidal thoughts 0   PHQ-9 Score 1   Difficult doing work/chores Somewhat difficult      Psychosocial Evaluation and Intervention:     Psychosocial Evaluation - 02/27/15 0954    Psychosocial Evaluation & Interventions    Interventions Encouraged to exercise with the program and follow exercise prescription;Relaxation education   Comments Counselor met with Ms. Eulas Post for initial psychosocial evaluation.  She is a 60 year old well-adjusted female who had (2) stents inserted in April and December of this past year.  She has a strong support system with a spouse of 40 years and (58) adult daughters, several of which live close by.  She has several health issues with osteoarthritis and Rheumatoid Arthritis in addition to hr cardiac issues.  She reports only sleeping 4 hours per night and has tried several OTC aids to help with this unsuccessfully.  She states she has lost some weight as her appetite has decreased.  She denies a history of depression or anxiety or current symptoms.  Ms. Woodroof is reportedly typically a positive person who has minimal stress in her life at this time.  Her goals for this program are weight loss, walk without as much pain and increased stamina.  Counselor recommended speaking with her doctor or pharmacist about sustained release  natural sleep aids to help with this issue.  Counselor will be following with Ms. Furber in the future.     Continued Psychosocial Services Needed Yes  Counselor recommends Ms. Seaborn speak with her doctor or pharmacist re: OTC extended release sleep aids as an option to address her limited and intermittent sleep.  She will also benefit from meeting with the dietician to address her weight loss goals.        Psychosocial Re-Evaluation:     Psychosocial Re-Evaluation      03/29/15 0947           Psychosocial Re-Evaluation   Comments Follow up with Ms. Kabel stating she has begun the OTC sleep aid with some success initially.   Then she had a steroid infusion which has interrupted her sleep again.  She hopes to return to improved sleep once this wears off.  She continues to work out and stay active.  Counselor commended her on her commitment and progress.             Vocational Rehabilitation: Provide vocational rehab assistance to qualifying candidates.   Vocational Rehab Evaluation & Intervention:   Education: Education Goals: Education classes will be provided on a weekly basis, covering required topics. Participant will state understanding/return demonstration of topics presented.  Learning Barriers/Preferences:   Education Topics: General Nutrition Guidelines/Fats and Fiber: -Group instruction provided by verbal, written material, models and posters to present the general guidelines for heart healthy nutrition. Gives an explanation and review of dietary fats and fiber.   Controlling Sodium/Reading Food Labels: -Group verbal and written material supporting the discussion of sodium use in heart  healthy nutrition. Review and explanation with models, verbal and written materials for utilization of the food label.   Exercise Physiology & Risk Factors: - Group verbal and written instruction with models to review the exercise physiology of the cardiovascular system and associated critical values. Details cardiovascular disease risk factors and the goals associated with each risk factor.   Aerobic Exercise & Resistance Training: - Gives group verbal and written discussion on the health impact of inactivity. On the components of aerobic and resistive training programs and the benefits of this training and how to safely progress through these programs.          Cardiac Rehab from 04/10/2015 in [facility] Cardiac and Pulmonary Rehab   Date  04/03/15   Educator  [facility]   Instruction Review Code  2- meets goals/outcomes      Flexibility, Balance, General Exercise Guidelines: - Provides group verbal and written instruction on the benefits of flexibility and balance training programs. Provides general exercise guidelines with specific guidelines to those with heart or lung disease. Demonstration and skill practice provided.      Cardiac Rehab from  04/10/2015 in [facility] Cardiac and Pulmonary Rehab   Date  04/03/15   Educator  [facility]   Instruction Review Code  2- meets goals/outcomes      Stress Management: - Provides group verbal and written instruction about the health risks of elevated stress, cause of high stress, and healthy ways to reduce stress.   Depression: - Provides group verbal and written instruction on the correlation between heart/lung disease and depressed mood, treatment options, and the stigmas associated with seeking treatment.      Cardiac Rehab from 04/10/2015 in [facility] Cardiac and Pulmonary Rehab   Date  03/22/15   Educator  [facility]   Instruction Review Code  2- meets goals/outcomes      Anatomy & Physiology of the Heart: - Group verbal and written instruction and models provide basic cardiac anatomy and physiology, with the coronary electrical and arterial systems. Review of: AMI, Angina, Valve disease, Heart Failure, Cardiac Arrhythmia, Pacemakers, and the ICD.      Cardiac Rehab from 04/10/2015 in [facility] Cardiac and Pulmonary Rehab   Date  04/10/15   Educator  SB   Instruction Review Code  2- meets goals/outcomes      Cardiac Procedures: - Group verbal and written instruction and models to describe the testing methods done to diagnose heart disease. Reviews the outcomes of the test results. Describes the treatment choices: Medical Management, Angioplasty, or Coronary Bypass Surgery.   Cardiac Medications: - Group verbal and written instruction to review commonly prescribed medications for heart disease. Reviews the medication, class of the drug, and side effects. Includes the steps to properly store meds and maintain the prescription regimen.      Cardiac Rehab from 04/10/2015 in [facility] Cardiac and Pulmonary Rehab   Date  03/08/15   Educator  SB   Instruction Review Code  2- meets goals/outcomes      Go Sex-Intimacy & Heart Disease, Get SMART - Goal Setting: - Group verbal and written instruction through game format  to discuss heart disease and the return to sexual intimacy. Provides group verbal and written material to discuss and apply goal setting through the application of the S.M.A.R.T. Method.   Other Matters of the Heart: - Provides group verbal, written materials and models to describe Heart Failure, Angina, Valve Disease, and Diabetes in the realm of heart disease. Includes description of the disease process and  treatment options available to the cardiac patient.      Cardiac Rehab from 04/10/2015 in [facility] Cardiac and Pulmonary Rehab   Date  02/27/15   Educator  SB   Instruction Review Code  2- meets goals/outcomes      Exercise & Equipment Safety: - Individual verbal instruction and demonstration of equipment use and safety with use of the equipment.      Cardiac Rehab from 04/10/2015 in [facility] Cardiac and Pulmonary Rehab   Date  01/17/15   Educator  Sb   Instruction Review Code  2- meets goals/outcomes      Infection Prevention: - Provides verbal and written material to individual with discussion of infection control including proper hand washing and proper equipment cleaning during exercise session.      Cardiac Rehab from 04/10/2015 in [facility] Cardiac and Pulmonary Rehab   Date  01/17/15   Educator  Sb   Instruction Review Code  2- meets goals/outcomes      Falls Prevention: - Provides verbal and written material to individual with discussion of falls prevention and safety.      Cardiac Rehab from 04/10/2015 in [facility] Cardiac and Pulmonary Rehab   Date  01/17/15   Educator  SB   Instruction Review Code  2- meets goals/outcomes      Diabetes: - Individual verbal and written instruction to review signs/symptoms of diabetes, desired ranges of glucose level fasting, after meals and with exercise. Advice that pre and post exercise glucose checks will be done for 3 sessions at entry of program.    Knowledge Questionnaire Score:   Core Components/Risk Factors/Patient Goals at  Admission:   Core Components/Risk Factors/Patient Goals Review:      Goals and Risk Factor Review      03/13/15 1058 04/20/15 1000         Core Components/Risk Factors/Patient Goals Review   Personal Goals Review Increase Aerobic Exercise and Physical Activity Weight Management/Obesity;Hypertension;Lipids      Review  Shamela ahs had sporadic visits for varied reasons. When she is here in class she does not report cardiac symptoms. Her weight remains steady and her BP readings are in good range and well controlled.       Expected Outcomes  Continue to follow exercise and nutriotn guidelines to maintian control of risk factors and to work on weight loss. Expected goal for weight loss is 2-5 pounds while in the program.      Increase Aerobic Exercise and Physical Activity (read-only)   Goals Progress/Improvement seen  Yes       Comments Maudy is progressing well in the program and has already increased workloads on both the TM and NS. She said she is already noticing that her body is adjusting to the exercise and she can do more and more each time. [place] can successfully exercise for the entire 30-45 minutes of aerobic exericse and in order to continue progression, we will focus on increases in intensity. We will follow up with her in a few weeks about beginning interval training and further workload increases on the machines.           Core Components/Risk Factors/Patient Goals at Discharge (Final Review):      Goals and Risk Factor Review - 04/20/15 1000    Core Components/Risk Factors/Patient Goals Review   Personal Goals Review Weight Management/Obesity;Hypertension;Lipids   Review Brian ahs had sporadic visits for varied reasons. When she is here in class she does not report cardiac symptoms. Her weight  remains steady and her BP readings are in good range and well controlled.    Expected Outcomes Continue to follow exercise and nutriotn guidelines to maintian control of risk factors and  to work on weight loss. Expected goal for weight loss is 2-5 pounds while in the program.      ITP Comments:     ITP Comments      02/23/15 1120 03/21/15 0741 04/20/15 0959 04/26/15 0707 05/21/15 0905   ITP Comments Ready for 30 day review. Continue with ITP.  Ahnyla ahs been absent this month becuse of car trouble and family health concners.  She plans to return next week 30 day review.  Continue with ITP.  Shaquetta is attending program. Sporadic attendance.  30 day review  Continue with ITP  Lalena is out this week because of back pain. 30 day review  Continue with ITP  Roya is out this week because of back pain. 30 day review  Continue with ITP  Charyl remains out because of back pain.     06/18/15 1149 06/21/15 0637 07/12/15 1510 07/19/15 0659 08/09/15 1552   ITP Comments 30 day review. Continue with ITP. HAs been absent since last visit on 04/10/2015 Maniya returned call.  She continues to have medical issues and plans to return once problems are resolved. Pt has been out since last medical review. 30 day review.  Continue with ITP  Remains out for medical concerns Pt has been out since last review.     08/16/15 0705 08/16/15 0706         ITP Comments Reamins out with medical concerns  COntinue with ITP Remains out with medical concerns  Continue with ITP         Comments:

## 2015-08-22 ENCOUNTER — Encounter: Payer: Self-pay | Admitting: *Deleted

## 2015-08-22 DIAGNOSIS — Z9861 Coronary angioplasty status: Secondary | ICD-10-CM

## 2015-09-06 ENCOUNTER — Encounter: Payer: Self-pay | Admitting: *Deleted

## 2015-09-06 DIAGNOSIS — Z9861 Coronary angioplasty status: Secondary | ICD-10-CM

## 2015-09-06 NOTE — Progress Notes (Signed)
Cardiac Individual Treatment Plan  Patient Details  Name: Karen Cervantes MRN: [id number] Date of Birth: 02/16/1955 Referring Provider:    Initial Encounter Date:  Flowsheet Row Cardiac Rehab from 01/17/2015 in [facility] Cardiac and Pulmonary Rehab  Date  01/17/15      Visit Diagnosis: S/P PTCA (percutaneous transluminal coronary angioplasty)  Patient's Home Medications on Admission:  Current Outpatient Prescriptions:  .  aspirin EC 81 MG tablet, Take 1 tablet (81 mg total) by mouth daily., Disp: 30 tablet, Rfl: 0 .  atorvastatin (LIPITOR) 80 MG tablet, Take 80 mg by mouth every evening., Disp: , Rfl:  .  carvedilol (COREG) 12.5 MG tablet, Take 1 tablet (12.5 mg total) by mouth 2 (two) times daily with a meal., Disp: 30 tablet, Rfl: 0 .  clopidogrel (PLAVIX) 75 MG tablet, Take 1 tablet (75 mg total) by mouth daily., Disp: 30 tablet, Rfl: 0 .  cyclobenzaprine (FLEXERIL) 10 MG tablet, Take 10 mg by mouth at bedtime., Disp: , Rfl:  .  ezetimibe (ZETIA) 10 MG tablet, Take 10 mg by mouth daily., Disp: , Rfl:  .  fenofibrate (TRICOR) 48 MG tablet, Take 1 tablet (48 mg total) by mouth daily., Disp: 30 tablet, Rfl: 6 .  FOLIC ACID PO, Take 1 tablet by mouth every morning. , Disp: , Rfl:  .  gabapentin (NEURONTIN) 300 MG capsule, Take 600 mg by mouth 2 (two) times daily. , Disp: , Rfl:  .  lidocaine (LIDODERM) 5 %, Place 1 patch onto the skin every 12 (twelve) hours. Remove & Discard patch within 12 hours or as directed by MD, Disp: 10 patch, Rfl: 0 .  lisinopril (PRINIVIL,ZESTRIL) 10 MG tablet, Take 1 tablet (10 mg total) by mouth daily., Disp: 30 tablet, Rfl: 0 .  Methotrexate, Anti-Rheumatic, (METHOTREXATE, PF, [place]), Inject 0.4 mLs into the skin once a week. Once a week, Disp: , Rfl:  .  Multiple Vitamins-Minerals (MULTIVITAMIN PO), Take 1 tablet by mouth every morning., Disp: , Rfl:  .  nitroGLYCERIN (NITROSTAT) 0.4 MG SL tablet, Place 1 tablet (0.4 mg total) under the tongue every 5 (five) minutes  as needed for chest pain., Disp: 25 tablet, Rfl: 3 .  omeprazole (PRILOSEC) 20 MG capsule, Take 20 mg by mouth 2 (two) times daily before a meal., Disp: , Rfl:  .  oxyCODONE-acetaminophen (PERCOCET) 10-325 MG tablet, Take 1 tablet by mouth every 6 (six) hours as needed for pain., Disp: , Rfl:  .  pantoprazole (PROTONIX) 40 MG tablet, Take 1 tablet (40 mg total) by mouth daily. (Patient not taking: Reported on 01/17/2015), Disp: 30 tablet, Rfl: 6 .  sucralfate (CARAFATE) 1 G tablet, Take 1 tablet (1 g total) by mouth 4 (four) times daily -  before meals and at bedtime. (Patient not taking: Reported on 01/02/2015), Disp: 60 tablet, Rfl: 0  Past Medical History: Past Medical History:  Diagnosis Date  . CAD (coronary artery disease)    a. NSTEMI 04/2014; b. cardiac cath 05/30/2014: ost LAD 30%, mLAD 95% s/p PCI/DES, ost RCA 60%, pRCA 50%, dRCA 60%, EF >55% by echo;  c. 12/2014 NSTEMI/PCI: LM nl, LAD 30ost, patent mid stent, D1 small, D2 min dzs, D3 small, LCX min irregs, OM1 small, OM2 min irregs, OM3 70, RCA 60ost, 40p, 5m/64m/d, 70d (3.25x28 Xience Alpine DES), RPDA min irregs, EF 55-65%.  . Diastolic dysfunction    a. echo 04/2014: EF [id number], no WMA, diastolic dysfunction, no valvular abnormalities, normal RVSP;  b. 12/2014 EF 55-65% by LV gram.  .  HLD (hyperlipidemia)   . Hypertension   . Obesity   . RA (rheumatoid arthritis) (HCC)     Tobacco Use: History  Smoking Status  . Former Smoker  Smokeless Tobacco  . Not on file    Labs: Recent Review Flowsheet Data    Labs for ITP Cardiac and Pulmonary Rehab Latest Ref Rng & Units 05/27/2014 05/28/2014 01/02/2015   Cholestrol mg/dL - 324(H) -   LDLCALC mg/dL - SEE COMMENT -   HDL mg/dL - 36(L) -   Trlycerides mg/dL - 658(H) -   Hemoglobin A1c 4.0 - 6.0 % 7.3(H) - 6.3(H)       Exercise Target Goals:    Exercise Program Goal: Individual exercise prescription set with THRR, safety & activity barriers. Participant demonstrates ability to  understand and report RPE using BORG scale, to self-measure pulse accurately, and to acknowledge the importance of the exercise prescription.  Exercise Prescription Goal: Starting with aerobic activity 30 plus minutes a day, 3 days per week for initial exercise prescription. Provide home exercise prescription and guidelines that participant acknowledges understanding prior to discharge.  Activity Barriers & Risk Stratification:   6 Minute Walk:   Initial Exercise Prescription:   Perform Capillary Blood Glucose checks as needed.  Exercise Prescription Changes:      Exercise Prescription Changes    Row Name 03/29/15 0800 04/10/15 1100           Exercise Review   Progression Yes Yes        Response to Exercise   Blood Pressure (Admit) - 124/68      Blood Pressure (Exercise) - 152/68      Blood Pressure (Exit) - 112/68      Heart Rate (Admit) - 75 bpm      Heart Rate (Exercise) - 92 bpm      Heart Rate (Exit) - 71 bpm      Rating of Perceived Exertion (Exercise) - 13      Symptoms - None      Comments - Karen Cervantes has increased in stamina and can now exercise for the entire 45 minutes of alloted aerobic exercise time. We will now focus on increases in level.       Duration - Progress to 50 minutes of aerobic without signs/symptoms of physical distress      Intensity - Rest + 30        Progression   Progression - Continue progressive overload as per policy without signs/symptoms or physical distress.        Resistance Training   Training Prescription  - Yes      Weight  - 2      Reps  - 10-15        Treadmill   MPH 1.8 2      Grade 0 0      Minutes 20 20        NuStep   Level  - 4      Watts  - 40      Minutes  - 20        REL-XR   Level 4 4      Watts 50 50      Minutes 20 20        Biostep-RELP   Level  - 5      Watts  - 35      Minutes  - 20         Exercise Comments:      Exercise  Comments    Row Name 05/17/15 [phone] 06/14/15 [id number] 07/12/15 1510  08/09/15 1552 08/22/15 1322   Exercise Comments The patient has not attended class since the last review. There has been no progress made due to lack of attendance. Workloads may need to be adjusted if the patient returns to class.  The patient has not attended class since the last review. There has been no progress made due to lack of attendance. Workloads may need to be adjusted if the patient returns to class.  Pt has been out since last medical review. Pt has been out since last review. Pt has been out since last review.      Discharge Exercise Prescription (Final Exercise Prescription Changes):     Exercise Prescription Changes - 04/10/15 1100      Exercise Review   Progression Yes     Response to Exercise   Blood Pressure (Admit) 124/68   Blood Pressure (Exercise) 152/68   Blood Pressure (Exit) 112/68   Heart Rate (Admit) 75 bpm   Heart Rate (Exercise) 92 bpm   Heart Rate (Exit) 71 bpm   Rating of Perceived Exertion (Exercise) 13   Symptoms None   Comments Shareece has increased in stamina and can now exercise for the entire 45 minutes of alloted aerobic exercise time. We will now focus on increases in level.    Duration Progress to 50 minutes of aerobic without signs/symptoms of physical distress   Intensity Rest + 30     Progression   Progression Continue progressive overload as per policy without signs/symptoms or physical distress.     Resistance Training   Training Prescription Yes   Weight 2   Reps 10-15     Treadmill   MPH 2   Grade 0   Minutes 20     NuStep   Level 4   Watts 40   Minutes 20     REL-XR   Level 4   Watts 50   Minutes 20     Biostep-RELP   Level 5   Watts 35   Minutes 20      Nutrition:  Target Goals: Understanding of nutrition guidelines, daily intake of sodium 1500mg , cholesterol 200mg , calories 30% from fat and 7% or less from saturated fats, daily to have 5 or more servings of fruits and  vegetables.  Biometrics:    Nutrition Therapy Plan and Nutrition Goals:   Nutrition Discharge: Rate Your Plate Scores:   Nutrition Goals Re-Evaluation:   Psychosocial: Target Goals: Acknowledge presence or absence of depression, maximize coping skills, provide positive support system. Participant is able to verbalize types and ability to use techniques and skills needed for reducing stress and depression.  Initial Review & Psychosocial Screening:   Quality of Life Scores:   PHQ-9: Recent Review Flowsheet Data    Depression screen [facility] 2/9 01/17/2015   Decreased Interest 0   Down, Depressed, Hopeless 0   PHQ - 2 Score 0   Altered sleeping 0   Tired, decreased energy 0   Change in appetite 1    Feeling bad or failure about yourself  0   Trouble concentrating 0   Moving slowly or fidgety/restless 0   Suicidal thoughts 0   PHQ-9 Score 1   Difficult doing work/chores Somewhat difficult      Psychosocial Evaluation and Intervention:   Psychosocial Re-Evaluation:     Psychosocial Re-Evaluation    Row Name 03/29/15 [phone]  Psychosocial Re-Evaluation   Comments Follow up with Ms. Kroenke stating she has begun the OTC sleep aid with some success initially.   Then she had a steroid infusion which has interrupted her sleep again.  She hopes to return to improved sleep once this wears off.  She continues to work out and stay active.  Counselor commended her on her commitment and progress.            Vocational Rehabilitation: Provide vocational rehab assistance to qualifying candidates.   Vocational Rehab Evaluation & Intervention:   Education: Education Goals: Education classes will be provided on a weekly basis, covering required topics. Participant will state understanding/return demonstration of topics presented.  Learning Barriers/Preferences:   Education Topics: General Nutrition Guidelines/Fats and Fiber: -Group instruction provided by  verbal, written material, models and posters to present the general guidelines for heart healthy nutrition. Gives an explanation and review of dietary fats and fiber.   Controlling Sodium/Reading Food Labels: -Group verbal and written material supporting the discussion of sodium use in heart healthy nutrition. Review and explanation with models, verbal and written materials for utilization of the food label.   Exercise Physiology & Risk Factors: - Group verbal and written instruction with models to review the exercise physiology of the cardiovascular system and associated critical values. Details cardiovascular disease risk factors and the goals associated with each risk factor.   Aerobic Exercise & Resistance Training: - Gives group verbal and written discussion on the health impact of inactivity. On the components of aerobic and resistive training programs and the benefits of this training and how to safely progress through these programs. Flowsheet Row Cardiac Rehab from 04/10/2015 in [facility] Cardiac and Pulmonary Rehab  Date  04/03/15  Educator  [facility]  Instruction Review Code  2- meets goals/outcomes      Flexibility, Balance, General Exercise Guidelines: - Provides group verbal and written instruction on the benefits of flexibility and balance training programs. Provides general exercise guidelines with specific guidelines to those with heart or lung disease. Demonstration and skill practice provided. Flowsheet Row Cardiac Rehab from 04/10/2015 in [facility] Cardiac and Pulmonary Rehab  Date  04/03/15  Educator  [facility]  Instruction Review Code  2- meets goals/outcomes      Stress Management: - Provides group verbal and written instruction about the health risks of elevated stress, cause of high stress, and healthy ways to reduce stress.   Depression: - Provides group verbal and written instruction on the correlation between heart/lung disease and depressed mood, treatment options, and the  stigmas associated with seeking treatment. Flowsheet Row Cardiac Rehab from 04/10/2015 in [facility] Cardiac and Pulmonary Rehab  Date  03/22/15  Educator  [facility]  Instruction Review Code  2- meets goals/outcomes      Anatomy & Physiology of the Heart: - Group verbal and written instruction and models provide basic cardiac anatomy and physiology, with the coronary electrical and arterial systems. Review of: AMI, Angina, Valve disease, Heart Failure, Cardiac Arrhythmia, Pacemakers, and the ICD. Flowsheet Row Cardiac Rehab from 04/10/2015 in [facility] Cardiac and Pulmonary Rehab  Date  04/10/15  Educator  SB  Instruction Review Code  2- meets goals/outcomes      Cardiac Procedures: - Group verbal and written instruction and models to describe the testing methods done to diagnose heart disease. Reviews the outcomes of the test results. Describes the treatment choices: Medical Management, Angioplasty, or Coronary Bypass Surgery.   Cardiac Medications: - Group verbal and written instruction to review commonly prescribed  medications for heart disease. Reviews the medication, class of the drug, and side effects. Includes the steps to properly store meds and maintain the prescription regimen. Flowsheet Row Cardiac Rehab from 04/10/2015 in [facility] Cardiac and Pulmonary Rehab  Date  03/08/15  Educator  SB  Instruction Review Code  2- meets goals/outcomes      Go Sex-Intimacy & Heart Disease, Get SMART - Goal Setting: - Group verbal and written instruction through game format to discuss heart disease and the return to sexual intimacy. Provides group verbal and written material to discuss and apply goal setting through the application of the S.M.A.R.T. Method.   Other Matters of the Heart: - Provides group verbal, written materials and models to describe Heart Failure, Angina, Valve Disease, and Diabetes in the realm of heart disease. Includes description of the disease process and treatment options available to  the cardiac patient. Flowsheet Row Cardiac Rehab from 04/10/2015 in [facility] Cardiac and Pulmonary Rehab  Date  02/27/15  Educator  SB  Instruction Review Code  2- meets goals/outcomes      Exercise & Equipment Safety: - Individual verbal instruction and demonstration of equipment use and safety with use of the equipment. Flowsheet Row Cardiac Rehab from 04/10/2015 in [facility] Cardiac and Pulmonary Rehab  Date  01/17/15  Educator  Sb  Instruction Review Code  2- meets goals/outcomes      Infection Prevention: - Provides verbal and written material to individual with discussion of infection control including proper hand washing and proper equipment cleaning during exercise session. Flowsheet Row Cardiac Rehab from 04/10/2015 in [facility] Cardiac and Pulmonary Rehab  Date  01/17/15  Educator  Sb  Instruction Review Code  2- meets goals/outcomes      Falls Prevention: - Provides verbal and written material to individual with discussion of falls prevention and safety. Flowsheet Row Cardiac Rehab from 04/10/2015 in [facility] Cardiac and Pulmonary Rehab  Date  01/17/15  Educator  SB  Instruction Review Code  2- meets goals/outcomes      Diabetes: - Individual verbal and written instruction to review signs/symptoms of diabetes, desired ranges of glucose level fasting, after meals and with exercise. Advice that pre and post exercise glucose checks will be done for 3 sessions at entry of program.    Knowledge Questionnaire Score:   Core Components/Risk Factors/Patient Goals at Admission:   Core Components/Risk Factors/Patient Goals Review:      Goals and Risk Factor Review    Row Name 03/13/15 1058 04/20/15 1000           Core Components/Risk Factors/Patient Goals Review   Personal Goals Review Increase Aerobic Exercise and Physical Activity Weight Management/Obesity;Hypertension;Lipids      Review  - Haleigh ahs had sporadic visits for varied reasons. When she is here in class she does not  report cardiac symptoms. Her weight remains steady and her BP readings are in good range and well controlled.       Expected Outcomes  - Continue to follow exercise and nutriotn guidelines to maintian control of risk factors and to work on weight loss. Expected goal for weight loss is 2-5 pounds while in the program.        Increase Aerobic Exercise and Physical Activity (read-only)   Goals Progress/Improvement seen  Yes  -      Comments Jodye is progressing well in the program and has already increased workloads on both the TM and NS. She said she is already noticing that her body is adjusting to  the exercise and she can do more and more each time. Samarra can successfully exercise for the entire 30-45 minutes of aerobic exericse and in order to continue progression, we will focus on increases in intensity. We will follow up with her in a few weeks about beginning interval training and further workload increases on the machines.   -         Core Components/Risk Factors/Patient Goals at Discharge (Final Review):      Goals and Risk Factor Review - 04/20/15 1000      Core Components/Risk Factors/Patient Goals Review   Personal Goals Review Weight Management/Obesity;Hypertension;Lipids   Review Tauriel ahs had sporadic visits for varied reasons. When she is here in class she does not report cardiac symptoms. Her weight remains steady and her BP readings are in good range and well controlled.    Expected Outcomes Continue to follow exercise and nutriotn guidelines to maintian control of risk factors and to work on weight loss. Expected goal for weight loss is 2-5 pounds while in the program.      ITP Comments:     ITP Comments    Row Name 03/21/15 0741 04/20/15 0959 04/26/15 0707 05/21/15 0905 06/18/15 1149   ITP Comments 30 day review.  Continue with ITP.  Guliana is attending program. Sporadic attendance.  30 day review  Continue with ITP  Sondos is out this week because of back pain. 30 day review   Continue with ITP  Antonae is out this week because of back pain. 30 day review  Continue with ITP  Eulalie remains out because of back pain. 30 day review. Continue with ITP. HAs been absent since last visit on 04/10/2015   Row Name 06/21/15 [id number] 07/12/15 1510 07/19/15 0659 08/09/15 1552 08/16/15 0705   ITP Comments Kinsly returned call.  She continues to have medical issues and plans to return once problems are resolved. Pt has been out since last medical review. 30 day review.  Continue with ITP  Remains out for medical concerns Pt has been out since last review. Reamins out with medical concerns  COntinue with ITP   Row Name 08/16/15 0706 08/22/15 1322 09/06/15 1035       ITP Comments Remains out with medical concerns  Continue with ITP Pt has been out since last review. Pt sent letter discharging her from program for lack of attendance        Comments: Discharge ITP

## 2015-09-06 NOTE — Progress Notes (Signed)
Discharge Summary  Patient Details  Name: Karen Cervantes MRN: [id number] Date of Birth: 07-Jan-1956 Referring Provider:     Number of Visits: 14  Reason for Discharge:  Early Exit:  No Attendance since 03/31/15 and no returned calls since May  Smoking History:  History  Smoking Status  . Former Smoker  Smokeless Tobacco  . Not on file    Diagnosis:  S/P PTCA (percutaneous transluminal coronary angioplasty)  ADL UCSD:   Initial Exercise Prescription:   Discharge Exercise Prescription (Final Exercise Prescription Changes):     Exercise Prescription Changes - 04/10/15 1100      Exercise Review   Progression Yes     Response to Exercise   Blood Pressure (Admit) 124/68   Blood Pressure (Exercise) 152/68   Blood Pressure (Exit) 112/68   Heart Rate (Admit) 75 bpm   Heart Rate (Exercise) 92 bpm   Heart Rate (Exit) 71 bpm   Rating of Perceived Exertion (Exercise) 13   Symptoms None   Comments Karen Cervantes has increased in stamina and can now exercise for the entire 45 minutes of alloted aerobic exercise time. We will now focus on increases in level.    Duration Progress to 50 minutes of aerobic without signs/symptoms of physical distress   Intensity Rest + 30     Progression   Progression Continue progressive overload as per policy without signs/symptoms or physical distress.     Resistance Training   Training Prescription Yes   Weight 2   Reps 10-15     Treadmill   MPH 2   Grade 0   Minutes 20     NuStep   Level 4   Watts 40   Minutes 20     REL-XR   Level 4   Watts 50   Minutes 20     Biostep-RELP   Level 5   Watts 35   Minutes 20      Functional Capacity:   Psychological, QOL, Others - Outcomes: PHQ 2/9: Depression screen PHQ 2/9 01/17/2015  Decreased Interest 0  Down, Depressed, Hopeless 0  PHQ - 2 Score 0  Altered sleeping 0  Tired, decreased energy 0  Change in appetite 1  Feeling bad or failure about yourself  0  Trouble concentrating 0   Moving slowly or fidgety/restless 0  Suicidal thoughts 0  PHQ-9 Score 1  Difficult doing work/chores Somewhat difficult    Quality of Life:   Personal Goals: Goals established at orientation with interventions provided to work toward goal.    Personal Goals Discharge:     Goals and Risk Factor Review    Row Name 03/13/15 1058 04/20/15 1000           Core Components/Risk Factors/Patient Goals Review   Personal Goals Review Increase Aerobic Exercise and Physical Activity Weight Management/Obesity;Hypertension;Lipids      Review  - Karen Cervantes ahs had sporadic visits for varied reasons. When she is here in class she does not report cardiac symptoms. Her weight remains steady and her BP readings are in good range and well controlled.       Expected Outcomes  - Continue to follow exercise and nutriotn guidelines to maintian control of risk factors and to work on weight loss. Expected goal for weight loss is 2-5 pounds while in the program.        Increase Aerobic Exercise and Physical Activity (read-only)   Goals Progress/Improvement seen  Yes  -      Comments Karen Cervantes is  progressing well in the program and has already increased workloads on both the TM and NS. She said she is already noticing that her body is adjusting to the exercise and she can do more and more each time. Karen Cervantes can successfully exercise for the entire 30-45 minutes of aerobic exericse and in order to continue progression, we will focus on increases in intensity. We will follow up with her in a few weeks about beginning interval training and further workload increases on the machines.   -         Nutrition & Weight - Outcomes:    Nutrition:   Nutrition Discharge:   Education Questionnaire Score:   Goals reviewed with patient; copy given to patient.

## 2015-09-13 ENCOUNTER — Encounter: Payer: Self-pay | Admitting: *Deleted

## 2015-09-13 DIAGNOSIS — Z9861 Coronary angioplasty status: Secondary | ICD-10-CM

## 2015-09-13 NOTE — Progress Notes (Signed)
Cardiac Individual Treatment Plan  Patient Details  Name: Karen Cervantes MRN: [id number] Date of Birth: 09/15/1955 Referring Provider:    Initial Encounter Date:  Flowsheet Row Cardiac Rehab from 01/17/2015 in [facility] Cardiac and Pulmonary Rehab  Date  01/17/15      Visit Diagnosis: S/P PTCA (percutaneous transluminal coronary angioplasty)  Patient's Home Medications on Admission:  Current Outpatient Prescriptions:  .  aspirin EC 81 MG tablet, Take 1 tablet (81 mg total) by mouth daily., Disp: 30 tablet, Rfl: 0 .  atorvastatin (LIPITOR) 80 MG tablet, Take 80 mg by mouth every evening., Disp: , Rfl:  .  carvedilol (COREG) 12.5 MG tablet, Take 1 tablet (12.5 mg total) by mouth 2 (two) times daily with a meal., Disp: 30 tablet, Rfl: 0 .  clopidogrel (PLAVIX) 75 MG tablet, Take 1 tablet (75 mg total) by mouth daily., Disp: 30 tablet, Rfl: 0 .  cyclobenzaprine (FLEXERIL) 10 MG tablet, Take 10 mg by mouth at bedtime., Disp: , Rfl:  .  ezetimibe (ZETIA) 10 MG tablet, Take 10 mg by mouth daily., Disp: , Rfl:  .  fenofibrate (TRICOR) 48 MG tablet, Take 1 tablet (48 mg total) by mouth daily., Disp: 30 tablet, Rfl: 6 .  FOLIC ACID PO, Take 1 tablet by mouth every morning. , Disp: , Rfl:  .  gabapentin (NEURONTIN) 300 MG capsule, Take 600 mg by mouth 2 (two) times daily. , Disp: , Rfl:  .  lidocaine (LIDODERM) 5 %, Place 1 patch onto the skin every 12 (twelve) hours. Remove & Discard patch within 12 hours or as directed by MD, Disp: 10 patch, Rfl: 0 .  lisinopril (PRINIVIL,ZESTRIL) 10 MG tablet, Take 1 tablet (10 mg total) by mouth daily., Disp: 30 tablet, Rfl: 0 .  Methotrexate, Anti-Rheumatic, (METHOTREXATE, PF, [place]), Inject 0.4 mLs into the skin once a week. Once a week, Disp: , Rfl:  .  Multiple Vitamins-Minerals (MULTIVITAMIN PO), Take 1 tablet by mouth every morning., Disp: , Rfl:  .  nitroGLYCERIN (NITROSTAT) 0.4 MG SL tablet, Place 1 tablet (0.4 mg total) under the tongue every 5 (five) minutes  as needed for chest pain., Disp: 25 tablet, Rfl: 3 .  omeprazole (PRILOSEC) 20 MG capsule, Take 20 mg by mouth 2 (two) times daily before a meal., Disp: , Rfl:  .  oxyCODONE-acetaminophen (PERCOCET) 10-325 MG tablet, Take 1 tablet by mouth every 6 (six) hours as needed for pain., Disp: , Rfl:  .  pantoprazole (PROTONIX) 40 MG tablet, Take 1 tablet (40 mg total) by mouth daily. (Patient not taking: Reported on 01/17/2015), Disp: 30 tablet, Rfl: 6 .  sucralfate (CARAFATE) 1 G tablet, Take 1 tablet (1 g total) by mouth 4 (four) times daily -  before meals and at bedtime. (Patient not taking: Reported on 01/02/2015), Disp: 60 tablet, Rfl: 0  Past Medical History: Past Medical History:  Diagnosis Date  . CAD (coronary artery disease)    a. NSTEMI 04/2014; b. cardiac cath 05/30/2014: ost LAD 30%, mLAD 95% s/p PCI/DES, ost RCA 60%, pRCA 50%, dRCA 60%, EF >55% by echo;  c. 12/2014 NSTEMI/PCI: LM nl, LAD 30ost, patent mid stent, D1 small, D2 min dzs, D3 small, LCX min irregs, OM1 small, OM2 min irregs, OM3 70, RCA 60ost, 40p, 71m/55m/d, 70d (3.25x28 Xience Alpine DES), RPDA min irregs, EF 55-65%.  . Diastolic dysfunction    a. echo 04/2014: EF 123456, no WMA, diastolic dysfunction, no valvular abnormalities, normal RVSP;  b. 12/2014 EF 55-65% by LV gram.  .  HLD (hyperlipidemia)   . Hypertension   . Obesity   . RA (rheumatoid arthritis) (HCC)     Tobacco Use: History  Smoking Status  . Former Smoker  Smokeless Tobacco  . Not on file    Labs: Recent Review Flowsheet Data    Labs for ITP Cardiac and Pulmonary Rehab Latest Ref Rng & Units 05/27/2014 05/28/2014 01/02/2015   Cholestrol mg/dL - 324(H) -   LDLCALC mg/dL - SEE COMMENT -   HDL mg/dL - 36(L) -   Trlycerides mg/dL - 658(H) -   Hemoglobin A1c 4.0 - 6.0 % 7.3(H) - 6.3(H)       Exercise Target Goals:    Exercise Program Goal: Individual exercise prescription set with THRR, safety & activity barriers. Participant demonstrates ability to  understand and report RPE using BORG scale, to self-measure pulse accurately, and to acknowledge the importance of the exercise prescription.  Exercise Prescription Goal: Starting with aerobic activity 30 plus minutes a day, 3 days per week for initial exercise prescription. Provide home exercise prescription and guidelines that participant acknowledges understanding prior to discharge.  Activity Barriers & Risk Stratification:   6 Minute Walk:   Initial Exercise Prescription:   Perform Capillary Blood Glucose checks as needed.  Exercise Prescription Changes:   Exercise Comments:     Exercise Comments    Row Name 08/09/15 1552 08/22/15 1322         Exercise Comments Pt has been out since last review. Pt has been out since last review.         Discharge Exercise Prescription (Final Exercise Prescription Changes):   Nutrition:  Target Goals: Understanding of nutrition guidelines, daily intake of sodium 1500mg , cholesterol 200mg , calories 30% from fat and 7% or less from saturated fats, daily to have 5 or more servings of fruits and vegetables.  Biometrics:    Nutrition Therapy Plan and Nutrition Goals:   Nutrition Discharge: Rate Your Plate Scores:   Nutrition Goals Re-Evaluation:   Psychosocial: Target Goals: Acknowledge presence or absence of depression, maximize coping skills, provide positive support system. Participant is able to verbalize types and ability to use techniques and skills needed for reducing stress and depression.  Initial Review & Psychosocial Screening:   Quality of Life Scores:   PHQ-9: Recent Review Flowsheet Data    Depression screen [facility] 2/9 01/17/2015   Decreased Interest 0   Down, Depressed, Hopeless 0   PHQ - 2 Score 0   Altered sleeping 0   Tired, decreased energy 0   Change in appetite 1    Feeling bad or failure about yourself  0   Trouble concentrating 0   Moving slowly or fidgety/restless 0   Suicidal thoughts 0    PHQ-9 Score 1   Difficult doing work/chores Somewhat difficult      Psychosocial Evaluation and Intervention:   Psychosocial Re-Evaluation:   Vocational Rehabilitation: Provide vocational rehab assistance to qualifying candidates.   Vocational Rehab Evaluation & Intervention:   Education: Education Goals: Education classes will be provided on a weekly basis, covering required topics. Participant will state understanding/return demonstration of topics presented.  Learning Barriers/Preferences:   Education Topics: General Nutrition Guidelines/Fats and Fiber: -Group instruction provided by verbal, written material, models and posters to present the general guidelines for heart healthy nutrition. Gives an explanation and review of dietary fats and fiber.   Controlling Sodium/Reading Food Labels: -Group verbal and written material supporting the discussion of sodium use in heart healthy nutrition. Review and explanation  with models, verbal and written materials for utilization of the food label.   Exercise Physiology & Risk Factors: - Group verbal and written instruction with models to review the exercise physiology of the cardiovascular system and associated critical values. Details cardiovascular disease risk factors and the goals associated with each risk factor.   Aerobic Exercise & Resistance Training: - Gives group verbal and written discussion on the health impact of inactivity. On the components of aerobic and resistive training programs and the benefits of this training and how to safely progress through these programs. Flowsheet Row Cardiac Rehab from 04/10/2015 in [facility] Cardiac and Pulmonary Rehab  Date  04/03/15  Educator  [facility]  Instruction Review Code  2- meets goals/outcomes      Flexibility, Balance, General Exercise Guidelines: - Provides group verbal and written instruction on the benefits of flexibility and balance training programs. Provides general exercise  guidelines with specific guidelines to those with heart or lung disease. Demonstration and skill practice provided. Flowsheet Row Cardiac Rehab from 04/10/2015 in [facility] Cardiac and Pulmonary Rehab  Date  04/03/15  Educator  [facility]  Instruction Review Code  2- meets goals/outcomes      Stress Management: - Provides group verbal and written instruction about the health risks of elevated stress, cause of high stress, and healthy ways to reduce stress.   Depression: - Provides group verbal and written instruction on the correlation between heart/lung disease and depressed mood, treatment options, and the stigmas associated with seeking treatment. Flowsheet Row Cardiac Rehab from 04/10/2015 in [facility] Cardiac and Pulmonary Rehab  Date  03/22/15  Educator  [facility]  Instruction Review Code  2- meets goals/outcomes      Anatomy & Physiology of the Heart: - Group verbal and written instruction and models provide basic cardiac anatomy and physiology, with the coronary electrical and arterial systems. Review of: AMI, Angina, Valve disease, Heart Failure, Cardiac Arrhythmia, Pacemakers, and the ICD. Flowsheet Row Cardiac Rehab from 04/10/2015 in [facility] Cardiac and Pulmonary Rehab  Date  04/10/15  Educator  SB  Instruction Review Code  2- meets goals/outcomes      Cardiac Procedures: - Group verbal and written instruction and models to describe the testing methods done to diagnose heart disease. Reviews the outcomes of the test results. Describes the treatment choices: Medical Management, Angioplasty, or Coronary Bypass Surgery.   Cardiac Medications: - Group verbal and written instruction to review commonly prescribed medications for heart disease. Reviews the medication, class of the drug, and side effects. Includes the steps to properly store meds and maintain the prescription regimen. Flowsheet Row Cardiac Rehab from 04/10/2015 in [facility] Cardiac and Pulmonary Rehab  Date  03/08/15  Educator  SB  Instruction  Review Code  2- meets goals/outcomes      Go Sex-Intimacy & Heart Disease, Get SMART - Goal Setting: - Group verbal and written instruction through game format to discuss heart disease and the return to sexual intimacy. Provides group verbal and written material to discuss and apply goal setting through the application of the S.M.A.R.T. Method.   Other Matters of the Heart: - Provides group verbal, written materials and models to describe Heart Failure, Angina, Valve Disease, and Diabetes in the realm of heart disease. Includes description of the disease process and treatment options available to the cardiac patient. Flowsheet Row Cardiac Rehab from 04/10/2015 in [facility] Cardiac and Pulmonary Rehab  Date  02/27/15  Educator  SB  Instruction Review Code  2- meets goals/outcomes  Exercise & Equipment Safety: - Individual verbal instruction and demonstration of equipment use and safety with use of the equipment. Flowsheet Row Cardiac Rehab from 04/10/2015 in [facility] Cardiac and Pulmonary Rehab  Date  01/17/15  Educator  Sb  Instruction Review Code  2- meets goals/outcomes      Infection Prevention: - Provides verbal and written material to individual with discussion of infection control including proper hand washing and proper equipment cleaning during exercise session. Flowsheet Row Cardiac Rehab from 04/10/2015 in [facility] Cardiac and Pulmonary Rehab  Date  01/17/15  Educator  Sb  Instruction Review Code  2- meets goals/outcomes      Falls Prevention: - Provides verbal and written material to individual with discussion of falls prevention and safety. Flowsheet Row Cardiac Rehab from 04/10/2015 in [facility] Cardiac and Pulmonary Rehab  Date  01/17/15  Educator  SB  Instruction Review Code  2- meets goals/outcomes      Diabetes: - Individual verbal and written instruction to review signs/symptoms of diabetes, desired ranges of glucose level fasting, after meals and with exercise. Advice  that pre and post exercise glucose checks will be done for 3 sessions at entry of program.    Knowledge Questionnaire Score:   Core Components/Risk Factors/Patient Goals at Admission:   Core Components/Risk Factors/Patient Goals Review:    Core Components/Risk Factors/Patient Goals at Discharge (Final Review):    ITP Comments:     ITP Comments    Row Name 07/19/15 0659 08/09/15 1552 08/16/15 0705 08/16/15 0706 08/22/15 1322   ITP Comments 30 day review.  Continue with ITP  Remains out for medical concerns Pt has been out since last review. Reamins out with medical concerns  COntinue with ITP Remains out with medical concerns  Continue with ITP Pt has been out since last review.   [place] Name 09/06/15 1035 09/13/15 0714         ITP Comments Pt sent letter discharging her from program for lack of attendance Discharged         Comments:

## 2015-11-23 ENCOUNTER — Other Ambulatory Visit: Payer: Self-pay | Admitting: Family Medicine

## 2015-11-23 DIAGNOSIS — Z1231 Encounter for screening mammogram for malignant neoplasm of breast: Secondary | ICD-10-CM

## 2015-12-06 ENCOUNTER — Telehealth: Payer: Self-pay | Admitting: Cardiovascular Disease

## 2015-12-06 NOTE — Telephone Encounter (Signed)
l mom to schedule appt per PCP for CAD/MI, previous pt of Dr. Fletcher Anon. Needs to start back cardiac rehab

## 2015-12-11 ENCOUNTER — Encounter: Payer: Self-pay | Admitting: *Deleted

## 2015-12-19 ENCOUNTER — Ambulatory Visit: Payer: Medicare Other

## 2015-12-26 ENCOUNTER — Ambulatory Visit: Admission: RE | Admit: 2015-12-26 | Payer: Medicare Other | Source: Ambulatory Visit

## 2016-02-10 ENCOUNTER — Emergency Department
Admission: EM | Admit: 2016-02-10 | Discharge: 2016-02-10 | Disposition: A | Discharge status: Home / Self Care | Payer: Medicare Other | Attending: Emergency Medicine | Admitting: Emergency Medicine

## 2016-02-10 ENCOUNTER — Encounter: Payer: Self-pay | Admitting: Emergency Medicine

## 2016-02-10 DIAGNOSIS — M1990 Unspecified osteoarthritis, unspecified site: Secondary | ICD-10-CM | POA: Diagnosis not present

## 2016-02-10 DIAGNOSIS — Z87891 Personal history of nicotine dependence: Secondary | ICD-10-CM | POA: Insufficient documentation

## 2016-02-10 DIAGNOSIS — I2510 Atherosclerotic heart disease of native coronary artery without angina pectoris: Secondary | ICD-10-CM | POA: Diagnosis not present

## 2016-02-10 DIAGNOSIS — Z79899 Other long term (current) drug therapy: Secondary | ICD-10-CM | POA: Diagnosis not present

## 2016-02-10 DIAGNOSIS — I10 Essential (primary) hypertension: Secondary | ICD-10-CM | POA: Diagnosis not present

## 2016-02-10 DIAGNOSIS — Z7982 Long term (current) use of aspirin: Secondary | ICD-10-CM | POA: Diagnosis not present

## 2016-02-10 DIAGNOSIS — G8929 Other chronic pain: Secondary | ICD-10-CM | POA: Diagnosis present

## 2016-02-10 DIAGNOSIS — M549 Dorsalgia, unspecified: Secondary | ICD-10-CM

## 2016-02-10 MED ORDER — OXYCODONE-ACETAMINOPHEN 10-325 MG PO TABS: 1.0000 | ORAL_TABLET | Freq: Four times a day (QID) | ORAL | 0 refills | Status: DC | PRN

## 2016-02-10 NOTE — ED Triage Notes (Addendum)
Medication refill for percocet- states unable to get any from pcp

## 2016-02-10 NOTE — ED Notes (Signed)
See triage note  Per family she is on a maintenance dose of percocet for pain  Was scheduled to receive more on Tuesday but her MD did not write the rx'  She has been taking her gabapentin for nerve pain

## 2016-02-10 NOTE — Discharge Instructions (Signed)
Take the pain medicine as directed. Follow-up with your pain management specialist for further treatment, as scheduled.

## 2016-02-10 NOTE — ED Triage Notes (Signed)
Daughter is worried pt in withdrawal - vss.

## 2016-02-10 NOTE — ED Provider Notes (Signed)
[facility] Emergency Department Provider Note ____________________________________________  Time seen: 1354  I have reviewed the triage vital signs and the nursing notes.  HISTORY  Chief Complaint  Arthritis  HPI Karen Cervantes is a 61 y.o. female presents to the ED for request for a refill of her chronic pain medicine.She reports that she gets a monthly supply of oxycodone 10 mg tablets that she doses from her primary care provider. She reports her providers only in the office on Tuesdays 2 on further prescription refills. She presented to the clinic on Tuesday of last week, but is not set for a refill. She claims she dosed her last tablet last night. Since that time she's had some increased body chills and pain. She denies any nausea, vomiting, or recent illness. Her granddaughter is concerned for some signs of withdrawal. The patient reports she is to see her provider on Tuesday, in 3 days for her scheduled refill.  Past Medical History:  Diagnosis Date  . CAD (coronary artery disease)    a. NSTEMI 04/2014; b. cardiac cath 05/30/2014: ost LAD 30%, mLAD 95% s/p PCI/DES, ost RCA 60%, pRCA 50%, dRCA 60%, EF >55% by echo;  c. 12/2014 NSTEMI/PCI: LM nl, LAD 30ost, patent mid stent, D1 small, D2 min dzs, D3 small, LCX min irregs, OM1 small, OM2 min irregs, OM3 70, RCA 60ost, 40p, 12m/7m/d, 70d (3.25x28 Xience Alpine DES), RPDA min irregs, EF 55-65%.  . Diastolic dysfunction    a. echo 04/2014: EF 123456, no WMA, diastolic dysfunction, no valvular abnormalities, normal RVSP;  b. 12/2014 EF 55-65% by LV gram.  . HLD (hyperlipidemia)   . Hypertension   . Obesity   . RA (rheumatoid arthritis) [facility])     Patient Active Problem List   Diagnosis Date Noted  . S/P coronary artery stent placement   . Ischemic chest pain ([place])   . Essential hypertension 06/10/2014  . CAD (coronary artery disease)   . HLD (hyperlipidemia)   . Obesity   . RA (rheumatoid arthritis) ([place])   .  Diastolic dysfunction   . NSTEMI (non-ST elevated myocardial infarction) ([place]) 05/28/2014    Past Surgical History:  Procedure Laterality Date  . CARDIAC CATHETERIZATION N/A 05/30/2014   Procedure: Left Heart Cath and Coronary Angiography;  Surgeon: Wellington Hampshire, MD;  Location: [place] [facility];  Service: Cardiovascular;  Laterality: N/A;  . CARDIAC CATHETERIZATION N/A 05/30/2014   Procedure: Coronary Stent Intervention;  Surgeon: Wellington Hampshire, MD;  Location: [place] [facility];  Service: Cardiovascular;  Laterality: N/A;  . CARDIAC CATHETERIZATION N/A 01/02/2015   Procedure: Left Heart Cath and Coronary Angiography;  Surgeon: Wellington Hampshire, MD;  Location: [place] [facility];  Service: Cardiovascular;  Laterality: N/A;  . CARDIAC CATHETERIZATION N/A 01/02/2015   Procedure: Coronary Stent Intervention;  Surgeon: Wellington Hampshire, MD;  Location: [place] [facility];  Service: Cardiovascular;  Laterality: N/A;    Prior to Admission medications   Medication Sig Start Date End Date Taking? Authorizing Provider  aspirin EC 81 MG tablet Take 1 tablet (81 mg total) by mouth daily. 05/31/14   Epifanio Lesches, MD  atorvastatin (LIPITOR) 80 MG tablet Take 80 mg by mouth every evening.    Historical Provider, MD  carvedilol (COREG) 12.5 MG tablet Take 1 tablet (12.5 mg total) by mouth 2 (two) times daily with a meal. 01/03/15   Demetrios Loll, MD  clopidogrel (PLAVIX) 75 MG tablet Take 1 tablet (75 mg total) by mouth  daily. 01/03/15   Demetrios Loll, MD  cyclobenzaprine (FLEXERIL) 10 MG tablet Take 10 mg by mouth at bedtime.    Historical Provider, MD  ezetimibe (ZETIA) 10 MG tablet Take 10 mg by mouth daily.    Historical Provider, MD  fenofibrate (TRICOR) 48 MG tablet Take 1 tablet (48 mg total) by mouth daily. 07/12/14   Wellington Hampshire, MD  FOLIC ACID PO Take 1 tablet by mouth every morning.     Historical Provider, MD  gabapentin (NEURONTIN) 300 MG capsule Take 600 mg by mouth 2 (two) times  daily.     Historical Provider, MD  lidocaine (LIDODERM) 5 % Place 1 patch onto the skin every 12 (twelve) hours. Remove & Discard patch within 12 hours or as directed by MD 04/12/15   Charline Bills Cuthriell, PA-C  lisinopril (PRINIVIL,ZESTRIL) 10 MG tablet Take 1 tablet (10 mg total) by mouth daily. 01/03/15   Demetrios Loll, MD  Methotrexate, Anti-Rheumatic, (METHOTREXATE, PF, [place]) Inject 0.4 mLs into the skin once a week. Once a week    Historical Provider, MD  Multiple Vitamins-Minerals (MULTIVITAMIN PO) Take 1 tablet by mouth every morning.    Historical Provider, MD  nitroGLYCERIN (NITROSTAT) 0.4 MG SL tablet Place 1 tablet (0.4 mg total) under the tongue every 5 (five) minutes as needed for chest pain. 01/03/15   Rogelia Mire, NP  omeprazole (PRILOSEC) 20 MG capsule Take 20 mg by mouth 2 (two) times daily before a meal.    Historical Provider, MD  oxyCODONE-acetaminophen (PERCOCET) 10-325 MG tablet Take 1 tablet by mouth every 6 (six) hours as needed for pain.    Historical Provider, MD  oxyCODONE-acetaminophen (PERCOCET) 10-325 MG tablet Take 1 tablet by mouth every 6 (six) hours as needed for pain. 02/10/16 02/13/16  Nilo Fallin V Bacon Alverna Fawley, PA-C  pantoprazole (PROTONIX) 40 MG tablet Take 1 tablet (40 mg total) by mouth daily. Patient not taking: Reported on 01/17/2015 01/03/15   Rogelia Mire, NP  sucralfate (CARAFATE) 1 G tablet Take 1 tablet (1 g total) by mouth 4 (four) times daily -  before meals and at bedtime. Patient not taking: Reported on 01/02/2015 05/31/14   Epifanio Lesches, MD    Allergies Remicade [infliximab]  Family History  Problem Relation Age of Onset  . Heart attack Father   . Heart disease Father   . Heart Problems Brother   . Heart Problems Brother   . Heart Problems Brother     Social History Social History  Substance Use Topics  . Smoking status: Former Research scientist (life sciences)  . Smokeless tobacco: Never Used  . Alcohol use No    Review of Systems  Constitutional:  Negative for fever. Eyes: Negative for visual changes. ENT: Negative for sore throat. Cardiovascular: Negative for chest pain. Respiratory: Negative for shortness of breath. Gastrointestinal: Negative for abdominal pain, vomiting and diarrhea. Genitourinary: Negative for dysuria. Musculoskeletal: Positive for chronic back and joint pain. Skin: Negative for rash. Neurological: Negative for headaches, focal weakness or numbness. ____________________________________________  PHYSICAL EXAM:  VITAL SIGNS: ED Triage Vitals [02/10/16 1235]  Enc Vitals Group     BP (!) 160/75     Pulse Rate 91     Resp 16     Temp 98.3 F (36.8 C)     Temp Source Oral     SpO2 97 %     Weight 204 lb (92.5 kg)     Height 5\' 3"  (1.6 m)     Head Circumference  Peak Flow      Pain Score 9     Pain Loc      Pain Edu?      Excl. in [place]?    Constitutional: Alert and oriented. Well appearing and in no distress. Head: Normocephalic and atraumatic. Eyes: Conjunctivae are normal. PERRL. Normal extraocular movements Ears: Canals clear. TMs intact bilaterally. Nose: No congestion/rhinorrhea/epistaxis. Mouth/Throat: Mucous membranes are moist. Neck: Supple. No thyromegaly. Cardiovascular: Normal rate, regular rhythm. Normal distal pulses. Respiratory: Normal respiratory effort. No wheezes/rales/rhonchi. Gastrointestinal: Soft and nontender. No distention. Musculoskeletal: Nontender with normal range of motion in all extremities.  Neurologic:  Normal speech and language. No gross focal neurologic deficits are appreciated. Skin:  Skin is warm, dry and intact. No rash noted. Psychiatric: Mood and affect are normal. Patient exhibits appropriate insight and judgment. ____________________________________________  INITIAL IMPRESSION / ASSESSMENT AND PLAN / ED COURSE  Patient with chronic pain syndrome symptoms and chronic arthritis with request for a few days of her prescription pain medicine. She is  scheduled to see her provider on Tuesday, in 3 days for routine medicine refill. Patient otherwise denies any other symptoms at this time. Patient is discharged with #12 oxycodone 10-325 mg to dose as scheduled in the interim until her visit on Tuesday. She will see her primary care provider for further pain management.  [place] Controlled Substances Database was reviewed prior to medication refill. Patient appears to be filling prescriptions from a single provider, as scheduled.   Clinical Course    ____________________________________________  FINAL CLINICAL IMPRESSION(S) / ED DIAGNOSES  Final diagnoses:  Other chronic pain  Chronic arthritis  Chronic bilateral back pain, unspecified back location      Melvenia Needles, PA-C 02/10/16 [place], PA-C 02/10/16 [place], MD 02/10/16 2015

## 2016-09-03 DIAGNOSIS — Z0289 Encounter for other administrative examinations: Secondary | ICD-10-CM | POA: Insufficient documentation

## 2016-09-16 ENCOUNTER — Ambulatory Visit
Payer: Medicare Other | Attending: Student in an Organized Health Care Education/Training Program | Admitting: Student in an Organized Health Care Education/Training Program

## 2016-09-16 ENCOUNTER — Encounter: Payer: Self-pay | Admitting: Student in an Organized Health Care Education/Training Program

## 2016-09-16 VITALS — BP 140/90 | HR 79 | Temp 98.5°F | Resp 16 | Ht 63.0 in

## 2016-09-16 DIAGNOSIS — M5126 Other intervertebral disc displacement, lumbar region: Secondary | ICD-10-CM | POA: Insufficient documentation

## 2016-09-16 DIAGNOSIS — M79671 Pain in right foot: Secondary | ICD-10-CM | POA: Insufficient documentation

## 2016-09-16 DIAGNOSIS — M2550 Pain in unspecified joint: Secondary | ICD-10-CM

## 2016-09-16 DIAGNOSIS — K219 Gastro-esophageal reflux disease without esophagitis: Secondary | ICD-10-CM | POA: Diagnosis not present

## 2016-09-16 DIAGNOSIS — M4316 Spondylolisthesis, lumbar region: Secondary | ICD-10-CM | POA: Insufficient documentation

## 2016-09-16 DIAGNOSIS — I252 Old myocardial infarction: Secondary | ICD-10-CM | POA: Insufficient documentation

## 2016-09-16 DIAGNOSIS — M791 Myalgia: Secondary | ICD-10-CM

## 2016-09-16 DIAGNOSIS — M059 Rheumatoid arthritis with rheumatoid factor, unspecified: Secondary | ICD-10-CM | POA: Insufficient documentation

## 2016-09-16 DIAGNOSIS — Z87891 Personal history of nicotine dependence: Secondary | ICD-10-CM | POA: Insufficient documentation

## 2016-09-16 DIAGNOSIS — M47812 Spondylosis without myelopathy or radiculopathy, cervical region: Secondary | ICD-10-CM | POA: Diagnosis not present

## 2016-09-16 DIAGNOSIS — Z7982 Long term (current) use of aspirin: Secondary | ICD-10-CM | POA: Diagnosis not present

## 2016-09-16 DIAGNOSIS — I129 Hypertensive chronic kidney disease with stage 1 through stage 4 chronic kidney disease, or unspecified chronic kidney disease: Secondary | ICD-10-CM | POA: Insufficient documentation

## 2016-09-16 DIAGNOSIS — Z955 Presence of coronary angioplasty implant and graft: Secondary | ICD-10-CM | POA: Diagnosis not present

## 2016-09-16 DIAGNOSIS — I5030 Unspecified diastolic (congestive) heart failure: Secondary | ICD-10-CM | POA: Diagnosis not present

## 2016-09-16 DIAGNOSIS — I25118 Atherosclerotic heart disease of native coronary artery with other forms of angina pectoris: Secondary | ICD-10-CM | POA: Diagnosis not present

## 2016-09-16 DIAGNOSIS — M25551 Pain in right hip: Secondary | ICD-10-CM | POA: Diagnosis not present

## 2016-09-16 DIAGNOSIS — Z87442 Personal history of urinary calculi: Secondary | ICD-10-CM | POA: Insufficient documentation

## 2016-09-16 DIAGNOSIS — G8929 Other chronic pain: Secondary | ICD-10-CM | POA: Diagnosis present

## 2016-09-16 DIAGNOSIS — M7918 Myalgia, other site: Secondary | ICD-10-CM

## 2016-09-16 DIAGNOSIS — M47816 Spondylosis without myelopathy or radiculopathy, lumbar region: Secondary | ICD-10-CM

## 2016-09-16 DIAGNOSIS — F329 Major depressive disorder, single episode, unspecified: Secondary | ICD-10-CM | POA: Insufficient documentation

## 2016-09-16 DIAGNOSIS — M79672 Pain in left foot: Secondary | ICD-10-CM | POA: Diagnosis not present

## 2016-09-16 DIAGNOSIS — E669 Obesity, unspecified: Secondary | ICD-10-CM | POA: Diagnosis not present

## 2016-09-16 DIAGNOSIS — G894 Chronic pain syndrome: Secondary | ICD-10-CM

## 2016-09-16 DIAGNOSIS — M2578 Osteophyte, vertebrae: Secondary | ICD-10-CM | POA: Insufficient documentation

## 2016-09-16 DIAGNOSIS — M069 Rheumatoid arthritis, unspecified: Secondary | ICD-10-CM | POA: Diagnosis not present

## 2016-09-16 DIAGNOSIS — E785 Hyperlipidemia, unspecified: Secondary | ICD-10-CM | POA: Insufficient documentation

## 2016-09-16 DIAGNOSIS — M5124 Other intervertebral disc displacement, thoracic region: Secondary | ICD-10-CM | POA: Insufficient documentation

## 2016-09-16 DIAGNOSIS — I2510 Atherosclerotic heart disease of native coronary artery without angina pectoris: Secondary | ICD-10-CM | POA: Insufficient documentation

## 2016-09-16 DIAGNOSIS — M48061 Spinal stenosis, lumbar region without neurogenic claudication: Secondary | ICD-10-CM | POA: Insufficient documentation

## 2016-09-16 DIAGNOSIS — Z96653 Presence of artificial knee joint, bilateral: Secondary | ICD-10-CM | POA: Insufficient documentation

## 2016-09-16 DIAGNOSIS — M4804 Spinal stenosis, thoracic region: Secondary | ICD-10-CM | POA: Insufficient documentation

## 2016-09-16 MED ORDER — DULOXETINE HCL 30 MG PO CPEP: 30.0000 mg | ORAL_CAPSULE | Freq: Every day | ORAL | 1 refills | Status: DC

## 2016-09-16 NOTE — Progress Notes (Signed)
Patient's Name: Karen Cervantes  MRN: [id number]  Referring Provider: Karna Cervantes  DOB: 1955/07/23  PCP: Denton Lank, MD  DOS: 09/16/2016  Note by: Gillis Santa, MD  Service setting: Ambulatory outpatient  Specialty: Interventional Pain Management  Location: [facility] (AMB) Pain Management Facility  Visit type: Initial Patient Evaluation  Patient type: New Patient   Primary Reason(s) for Visit: Encounter for initial evaluation of one or more chronic problems (new to examiner) potentially causing chronic pain, and posing a threat to normal musculoskeletal function. (Level of risk: High) CC: Hip Pain (right); Joint Pain (RA); Spasms (generalized); Foot Pain (bilateral,  has seen Dr Jens Som for this.); and Knee Pain (needs knee replacement on right)  HPI  Karen Cervantes is a 61 y.o. year old, female patient, who comes today to see [place] for the first time for an initial evaluation of her chronic pain. She has NSTEMI (non-ST elevated myocardial infarction) ([place]); CAD (coronary artery disease); HLD (hyperlipidemia); Obesity; RA (rheumatoid arthritis) ([place]); Diastolic dysfunction; Essential hypertension; Ischemic chest pain; and S/P coronary artery stent placement on her problem list. Today she comes in for evaluation of her Hip Pain (right); Joint Pain (RA); Spasms (generalized); Foot Pain (bilateral,  has seen Dr Jens Som for this.); and Knee Pain (needs knee replacement on right)  Pain Assessment: Location: Right Hip (joint pain and muscle spasms) Radiating: na Onset: More than a month ago Duration: Chronic pain Quality: Discomfort, Sharp, Constant Severity: 3 /10 (self-reported pain score)  Note: Reported level is compatible with observation.                   Effect on ADL: patient has difficulty walking or standing for very long Timing: Constant Modifying factors: wearing butrans patch, states they are not helping very much. heating pad to hip  Onset and Duration: Gradual and Date of onset: 1996 Cause of  pain: Arthritis Severity: Getting worse, NAS-11 at its worse: 10/10, NAS-11 at its best: 3/10, NAS-11 now: 3/10 and NAS-11 on the average: 8/10 Timing: Not influenced by the time of the day Aggravating Factors: Bending, Climbing, Kneeling, Lifiting, Motion, Prolonged sitting, Prolonged standing, Squatting, Stooping , Twisting, Walking, Walking uphill, Walking downhill and Working Alleviating Factors: Hot packs, Medications, Resting, Sitting and Warm showers or baths Associated Problems: Day-time cramps, Night-time cramps, Dizziness, Fatigue, Numbness, Spasms, Swelling, Tingling, Weakness, Pain that wakes patient up and Pain that does not allow patient to sleep Quality of Pain: Aching, Agonizing, Annoying, Burning, Constant, Deep, Disabling, Distressing, Exhausting, Feeling of constriction, Getting longer, Heavy, Itching, Nagging, Pressure-like, Sharp, Shooting, Tender, Tingling, Tiring, Toothache-like and Uncomfortable Previous Examinations or Tests: Bone scan, MRI scan, X-rays, Orthopedic evaluation and Chiropractic evaluation Previous Treatments: Narcotic medications, Pool exercises, Steroid treatments by mouth and Trigger point injections  The patient comes into the clinics today for the first time for a chronic pain management evaluation.   61 year old female with a past medical history of rheumatoid arthritis (seropositive), coronary artery disease (status post MI 2 in 2016), cervical spondylosis who presents with arthralgias of multiple locations including elbow, hand, feet, knee and left shoulder. Patient has previously been managed under the care of [facility] rheumatology was last seen on 05/14/2016. She was on oxycodone 10 mg up to 6 times a day. This was weaned down to 5 times a day but the patient had trouble tolerating this wean.  She has had a hard time getting in contact with her rheumatologist. She saw [facility] pain management. She was prescribed a Butrans patch 10  g to wear to weekly which she  states helps for the first 3 days. She is on week 3 of her Butrans patch. She states that she will not be returning to [facility] pain management given the distance and the fact that they have not been returning her phone calls or epic messages.  Of note patient was diagnosed with rheumatoid arthritis in 1996. She has been on chronic opioid therapy since about that time. Collateral information was obtained from her daughter and granddaughter. They state that the patient is not as active while Butrans patch and is limited on her activities of daily living. Her functionality and psychological well-being were much better according to her daughters one of whom is a nurse when she was on oral opioid therapy.  Today I took the time to provide the patient with information regarding my pain practice. The patient was informed that my practice is divided into two sections: an interventional pain management section, as well as a completely separate and distinct medication management section. I explained that I have procedure days for my interventional therapies, and evaluation days for follow-ups and medication management. Because of the amount of documentation required during both, they are kept separated. This means that there is the possibility that she may be scheduled for a procedure on one day, and medication management the next. I have also informed her that because of staffing and facility limitations, I no longer take patients for medication management only. To illustrate the reasons for this, I gave the patient the example of surgeons, and how inappropriate it would be to refer a patient to his/her care, just to write for the post-surgical antibiotics on a surgery done by a different surgeon.   Because interventional pain management is my board-certified specialty, the patient was informed that joining my practice means that they are open to any and all interventional therapies. I made it clear that this does not mean  that they will be forced to have any procedures done. What this means is that I believe interventional therapies to be essential part of the diagnosis and proper management of chronic pain conditions. Therefore, patients not interested in these interventional alternatives will be better served under the care of a different practitioner.  The patient was also made aware of my Comprehensive Pain Management Safety Guidelines where by joining my practice, they limit all of their nerve blocks and joint injections to those done by our practice, for as long as we are retained to manage their care.   Historic Controlled Substance Pharmacotherapy Review  PMP and historical list of controlled substances: Butrans 10 g, quantity 4, last fill 09/03/2016 Highest opioid analgesic regimen found: Percocet, 10 mg, quantity 180 (when she was taking 6 times a day) Most recent opioid analgesic: Butrans as above  Current opioid analgesics: Butrans as above  Highest recorded MME/day: 90 mg/day  Medications: The patient did not bring the medication(s) to the appointment, as requested in our "New Patient Package" Pharmacodynamics: Desired effects: Analgesia: The patient reports >50% benefit. Reported improvement in function: The patient reports medication allows her to accomplish basic ADLs. Clinically meaningful improvement in function (CMIF): Sustained CMIF goals met Perceived effectiveness: Described as relatively effective, allowing for increase in activities of daily living (ADL) Undesirable effects: Side-effects or Adverse reactions: None reported Historical Monitoring: The patient  reports that she does not use drugs. List of all UDS Test(s): No results found for: MDMA, COCAINSCRNUR, PCPSCRNUR, PCPQUANT, CANNABQUANT, THCU, [place] List of all Serum Drug Screening  Test(s):  No results found for: AMPHSCRSER, BARBSCRSER, BENZOSCRSER, COCAINSCRSER, PCPSCRSER, PCPQUANT, THCSCRSER, CANNABQUANT, OPIATESCRSER,  OXYSCRSER, PROPOXSCRSER Historical Background Evaluation: [place] PDMP: Six (6) year initial data search conducted.             [place] Department of public safety, offender search: Editor, commissioning Information) Non-contributory Risk Assessment Profile: Aberrant behavior: None observed or detected today Risk factors for fatal opioid overdose: None identified today Fatal overdose hazard ratio (HR): Calculation deferred Non-fatal overdose hazard ratio (HR): Calculation deferred Risk of opioid abuse or dependence: 0.7-3.0% with doses ? 36 MME/day and 6.1-26% with doses ? 120 MME/day. Substance use disorder (SUD) risk level: Pending results of Medical Psychology Evaluation for SUD Opioid risk tool (ORT) (Total Score): 0     Opioid Risk Tool - 09/16/16 1148      Family History of Substance Abuse   Alcohol Negative   Illegal Drugs Negative   Rx Drugs Negative     Personal History of Substance Abuse   Alcohol Negative   Illegal Drugs Negative   Rx Drugs Negative     Psychological Disease   Psychological Disease Negative   Depression Negative     Total Score   Opioid Risk Tool Scoring 0   Opioid Risk Interpretation Low Risk     ORT Scoring interpretation table:  Score <3 = Low Risk for SUD  Score between 4-7 = Moderate Risk for SUD  Score >8 = High Risk for Opioid Abuse    Pharmacologic Plan: Pending ordered tests and/or consults            Initial impression: Pending review of available data and ordered tests.  Meds   Current Meds  Medication Sig  . aspirin EC 81 MG tablet Take 1 tablet (81 mg total) by mouth daily.  Marland Kitchen atorvastatin (LIPITOR) 80 MG tablet Take 80 mg by mouth every evening.  . buprenorphine (BUTRANS) 10 MCG/HR PTWK patch Place 10 mcg onto the skin once a week.  . carvedilol (COREG) 12.5 MG tablet Take 1 tablet (12.5 mg total) by mouth 2 (two) times daily with a meal.  . clopidogrel (PLAVIX) 75 MG tablet Take 1 tablet (75 mg total) by mouth daily.  Marland Kitchen ezetimibe (ZETIA) 10 MG  tablet Take 10 mg by mouth daily.  . fenofibrate (TRICOR) 48 MG tablet Take 1 tablet (48 mg total) by mouth daily.  Marland Kitchen FOLIC ACID PO Take 1 tablet by mouth every morning.   . gabapentin (NEURONTIN) 300 MG capsule Take 600 mg by mouth 2 (two) times daily.   Marland Kitchen lisinopril (PRINIVIL,ZESTRIL) 10 MG tablet Take 1 tablet (10 mg total) by mouth daily.  . Melatonin 5 MG CAPS Take 15 mg by mouth at bedtime.  . metaxalone (SKELAXIN) 800 MG tablet Take 800 mg by mouth 3 (three) times daily.  . Methotrexate, Anti-Rheumatic, (METHOTREXATE, PF, [place]) Inject 0.4 mLs into the skin once a week. Once a week  . Multiple Vitamins-Minerals (MULTIVITAMIN PO) Take 1 tablet by mouth every morning.  . nitroGLYCERIN (NITROSTAT) 0.4 MG SL tablet Place 1 tablet (0.4 mg total) under the tongue every 5 (five) minutes as needed for chest pain.  Marland Kitchen omeprazole (PRILOSEC) 20 MG capsule Take 20 mg by mouth 2 (two) times daily before a meal.  . riTUXimab (RITUXAN) 100 MG/10ML injection Inject into the vein.  Marland Kitchen tiZANidine (ZANAFLEX) 4 MG capsule Take 4 mg by mouth 3 (three) times daily.    Imaging Review  Cervical Imaging:  Cervical DG complete:  Results for  orders placed in visit on 05/01/09  DG Cervical Spine Complete   Narrative  PRIOR REPORT IMPORTED FROM THE [facility] WORKFLOW SYSTEM   REASON FOR EXAM:    cervical radiculopathy 723.4 arthritis rheumatoid  714.0  COMMENTS:   PROCEDURE:     DXR - DXR CERVICAL SPINE COMPLETE  - May 01 2009 11:29AM   RESULT:     The vertebral body heights are well-maintained. No fracture is  seen. There is narrowing of the C5-C6 and C6-C7 cervical disc spaces  compatible with cervical disc disease. Anterior and posterior spur  formation  is noted at these levels. In addition, there is mild spur impingement on  the  neural foramina at C6-C7 on the left and at C5-C6 and C6-C7 on the right.  The odontoid process is intact. No cervical rib formation is seen. There  is  a mild cervical  scoliosis with a convexly to the left. In the lateral  view,  there is reversal of the usual lordotic curvature. This could either be  chronic or secondary to cervical muscle spasm.   IMPRESSION:  1. No fracture is seen.  2. There are changes of cervical disc disease at C5-C6 and C6-C7 as noted  above and with there being noted spur impingement on the neural foramina  at  these levels on the left and at C6-C7 on the right.  3. There is absence of the usual lordotic curvature which could be chronic  or secondary to cervical muscle spasm.   Thank you for the opportunity to contribute to the care of your patient.        Lumbosacral Imaging: Lumbar MR wo contrast:  Results for orders placed during the hospital encounter of 05/11/15  MR Lumbar Spine Wo Contrast   Narrative CLINICAL DATA:  61 year old female with lumbar back pain radiating to the left leg and foot with burning numbness and tingling. Pain with walking and some weakness for 5 weeks. Initial encounter.  EXAM: MRI LUMBAR SPINE WITHOUT CONTRAST  TECHNIQUE: Multiplanar, multisequence MR imaging of the lumbar spine was performed. No intravenous contrast was administered.  COMPARISON:  None.  FINDINGS: Lumbar segmentation appears to be normal and will be designated as such for this report. Very mild levoconvex scoliosis. Straightening of lumbar lordosis. Mild grade 1 anterolisthesis at L3 L for. No marrow edema or evidence of acute osseous abnormality.  No signal abnormality in the visualized lower thoracic spinal cord, abnormal mass effect on the cord at T11- T12 is described below. Conus medullaris appears normal at L1.  Visualized abdominal viscera and paraspinal soft tissues are within normal limits.  T11-T12: Disc space loss, disc desiccation, and moderate size left paracentral disc extrusion (series 2, image 6) superimposed on circumferential disc bulge. Mild facet hypertrophy. Moderate spinal stenosis  with mass effect on the ventral cord. No definite cord signal abnormality. No T11 foraminal involvement.  T12-L1: Minor disc desiccation and circumferential disc bulge. No stenosis.  L1-L2:  Mild mostly anterior disc bulging.  No stenosis.  L2-L3: Disc space loss with moderate circumferential disc bulge. Broad-based posterior component of disc with moderate facet and ligament flavum hypertrophy. Mild epidural lipomatosis. Moderate spinal stenosis. Borderline to mild left L2 foraminal stenosis.  L3-L4: Grade 1 anterolisthesis. Disc desiccation. Circumferential disc bulge with broad-based posterior component. Severe facet hypertrophy were thus on the right. Right side facet joint fluid. Severe spinal and right greater than left lateral recess stenosis. Mild right greater than left L3 foraminal stenosis.  There is  a moderate to large sequestered disc fragment in the left epidural space posterior to the L4 vertebral body (series 3, image 15, series 2, image 15 and series 6, image 30) encompassing 11 x 13 x 16 mm (AP by transverse by CC). I favor this originated from the L4-L5 disc space in light of the bulky left foraminal extrusion at that level (described below). Associated severe stenosis along the descending and proximal exiting left L4 nerve.  L4-L5: Vacuum disc. Circumferential disc osteophyte complex with bulky left foraminal disc extrusion best seen on series 6, image 33. Moderate to severe facet and ligament flavum hypertrophy.  Severe left L4 foraminal stenosis. Moderate spinal and bilateral lateral recess stenosis. Mild right L4 foraminal stenosis.  L5-S1: Far lateral disc osteophyte complex. Mild to moderate facet hypertrophy. Mild epidural lipomatosis. No stenosis.  IMPRESSION: 1. Grade 1 anterolisthesis at L3-L4 and advanced disc disease at L4-L5. Probably originating from the latter is a bulky sequestered disc fragment in the left epidural space posterior to the  L4 vertebral body. Superimposed bulky left foraminal disc extrusion at L4-L5. Superimposed bulky facet degeneration at L3-L4. 2. Subsequent severe spinal and lateral recess stenosis at L3-L4, severe stenosis along the course of the descending and exiting left L4 nerve, and moderate spinal and bilateral lateral recess stenosis at L4-L5. 3. Fairly advanced disc and posterior element degeneration at L2-L3 resulting in moderate spinal stenosis. 4. Bulky lower thoracic disc herniation at T11-T12 resulting in spinal stenosis with moderate mass effect on the lower thoracic spinal cord, no definite cord signal abnormality.   Electronically Signed   By: Genevie Ann M.D.   On: 05/11/2015 15:14     Complexity Note: Imaging results reviewed. Results discussed using Layman's terms.               ROS  Cardiovascular History: Daily Aspirin intake, High blood pressure, Chest pain, Heart attack ( Date: 2016), Heart surgery, Heart murmur, Heart catheterization, Blood thinners:  Anticoagulant and Needs antibiotics prior to dental procedures Pulmonary or Respiratory History: Snoring  Neurological History: No reported neurological signs or symptoms such as seizures, abnormal skin sensations, urinary and/or fecal incontinence, being born with an abnormal open spine and/or a tethered spinal cord Review of Past Neurological Studies: No results found for this or any previous visit. Psychological-Psychiatric History: No reported psychological or psychiatric signs or symptoms such as difficulty sleeping, anxiety, depression, delusions or hallucinations (schizophrenial), mood swings (bipolar disorders) or suicidal ideations or attempts Gastrointestinal History: Vomiting blood (Ulcers) and Reflux or heatburn Genitourinary History: No reported renal or genitourinary signs or symptoms such as difficulty voiding or producing urine, peeing blood, non-functioning kidney, kidney stones, difficulty emptying the bladder,  difficulty controlling the flow of urine, or chronic kidney disease Hematological History: Weakness due to low blood hemoglobin or red blood cell count (Anemia) Endocrine History: No reported endocrine signs or symptoms such as high or low blood sugar, rapid heart rate due to high thyroid levels, obesity or weight gain due to slow thyroid or thyroid disease Rheumatologic History: Joint aches and or swelling due to excess weight (Osteoarthritis) and Rheumatoid arthritis Musculoskeletal History: Negative for myasthenia gravis, muscular dystrophy, multiple sclerosis or malignant hyperthermia Work History: Disabled  Allergies  Ms. Duch is allergic to remicade [infliximab].  Laboratory Chemistry  Inflammation Markers (CRP: Acute Phase) (ESR: Chronic Phase) No results found for: CRP, ESRSEDRATE               Renal Function Markers Lab Results  Component Value  Date   BUN 9 01/03/2015   CREATININE 0.45 01/03/2015   GFRAA >60 01/03/2015   GFRNONAA >60 01/03/2015                 Hepatic Function Markers Lab Results  Component Value Date   AST 29 08/29/2012   ALT 41 08/29/2012   ALBUMIN 3.8 08/29/2012   ALKPHOS 83 08/29/2012                 Electrolytes Lab Results  Component Value Date   NA 139 01/03/2015   K 3.7 01/03/2015   CL 105 01/03/2015   CALCIUM 9.0 01/03/2015                 Neuropathy Markers No results found for: [id number]               Bone Pathology Markers Lab Results  Component Value Date   ALKPHOS 83 08/29/2012   CALCIUM 9.0 01/03/2015                 Coagulation Parameters Lab Results  Component Value Date   INR 1.02 01/02/2015   LABPROT 13.6 01/02/2015   APTT 35 01/02/2015   PLT 314 01/03/2015                 Cardiovascular Markers Lab Results  Component Value Date   HGB 12.0 01/03/2015   HCT 35.9 01/03/2015                 Note: Lab results reviewed.  [place]  Drug: Ms. Foisy  reports that she does not use drugs. Alcohol:  reports that  she does not drink alcohol. Tobacco:  reports that she has quit smoking. She has never used smokeless tobacco. Medical:  has a past medical history of CAD (coronary artery disease); Diastolic dysfunction; HLD (hyperlipidemia); Hypertension; Myocardial infarction [facility]) (may 2016, December 2016); Obesity; and RA (rheumatoid arthritis) ([place]). Family: family history includes Heart Problems in her brother, brother, and brother; Heart attack in her father; Heart disease in her father.  Past Surgical History:  Procedure Laterality Date  . CARDIAC CATHETERIZATION N/A 05/30/2014   Procedure: Left Heart Cath and Coronary Angiography;  Surgeon: Wellington Hampshire, MD;  Location: [place] CV [facility];  Service: Cardiovascular;  Laterality: N/A;  . CARDIAC CATHETERIZATION N/A 05/30/2014   Procedure: Coronary Stent Intervention;  Surgeon: Wellington Hampshire, MD;  Location: [place] [facility];  Service: Cardiovascular;  Laterality: N/A;  . CARDIAC CATHETERIZATION N/A 01/02/2015   Procedure: Left Heart Cath and Coronary Angiography;  Surgeon: Wellington Hampshire, MD;  Location: [place] [facility];  Service: Cardiovascular;  Laterality: N/A;  . CARDIAC CATHETERIZATION N/A 01/02/2015   Procedure: Coronary Stent Intervention;  Surgeon: Wellington Hampshire, MD;  Location: [place] [facility];  Service: Cardiovascular;  Laterality: N/A;   Active Ambulatory Problems    Diagnosis Date Noted  . NSTEMI (non-ST elevated myocardial infarction) ([place]) 05/28/2014  . CAD (coronary artery disease)   . HLD (hyperlipidemia)   . Obesity   . RA (rheumatoid arthritis) ([place])   . Diastolic dysfunction   . Essential hypertension 06/10/2014  . Ischemic chest pain   . S/P coronary artery stent placement    Resolved Ambulatory Problems    Diagnosis Date Noted  . No Resolved Ambulatory Problems   Past Medical History:  Diagnosis Date  . CAD (coronary artery disease)   . Diastolic dysfunction   . HLD (hyperlipidemia)   . Hypertension    .  Myocardial infarction [facility]) may 2016, December 2016  . Obesity   . RA (rheumatoid arthritis) ([place])    Constitutional Exam  General appearance: Well nourished, well developed, and well hydrated. In no apparent acute distress Vitals:   09/16/16 1132  BP: 140/90  Pulse: 79  Resp: 16  Temp: 98.5 F (36.9 C)  TempSrc: Oral  SpO2: 98%  Height: '5\' 3"'  (1.6 m)   BMI Assessment: Estimated body mass index is 36.14 kg/m as calculated from the following:   Height as of 02/10/16: '5\' 3"'  (1.6 m).   Weight as of 02/10/16: 204 lb (92.5 kg).  BMI interpretation table: BMI level Category Range association with higher incidence of chronic pain  <18 kg/m2 Underweight   18.5-24.9 kg/m2 Ideal body weight   25-29.9 kg/m2 Overweight Increased incidence by 20%  30-34.9 kg/m2 Obese (Class I) Increased incidence by 68%  35-39.9 kg/m2 Severe obesity (Class II) Increased incidence by 136%  >40 kg/m2 Extreme obesity (Class III) Increased incidence by 254%   BMI Readings from Last 4 Encounters:  02/10/16 36.14 kg/m  01/17/15 34.69 kg/m  01/03/15 33.76 kg/m  06/10/14 34.99 kg/m   Wt Readings from Last 4 Encounters:  02/10/16 204 lb (92.5 kg)  01/17/15 191 lb 3.2 oz (86.7 kg)  01/03/15 190 lb 9.6 oz (86.5 kg)  06/10/14 197 lb 8 oz (89.6 kg)  Psych/Mental status: Alert, oriented x 3 (person, place, & time)       Eyes: PERLA Respiratory: No evidence of acute respiratory distress  Cervical Spine Area Exam  Skin & Axial Inspection: No masses, redness, edema, swelling, or associated skin lesions Alignment: Symmetrical Functional ROM: Unrestricted ROM      Stability: No instability detected Muscle Tone/Strength: Functionally intact. No obvious neuro-muscular anomalies detected. Sensory (Neurological): Unimpaired Palpation: Complains of area being tender to palpation              Upper Extremity (UE) Exam    Side: Right upper extremity  Side: Left upper extremity  Skin & Extremity Inspection:  Skin color, temperature, and hair growth are WNL. No peripheral edema or cyanosis. No masses, redness, swelling, asymmetry, or associated skin lesions. No contractures.  Skin & Extremity Inspection: Skin color, temperature, and hair growth are WNL. No peripheral edema or cyanosis. No masses, redness, swelling, asymmetry, or associated skin lesions. No contractures.  Functional ROM: Unrestricted ROM          Functional ROM: Unrestricted ROM          Muscle Tone/Strength: Functionally intact. No obvious neuro-muscular anomalies detected.  Muscle Tone/Strength: Functionally intact. No obvious neuro-muscular anomalies detected.  Sensory (Neurological): Unimpaired  Sensory (Neurological): Unimpaired  Palpation: Tender            elbows, shoulders, wrists bilaterally   Palpation: Tender              Specialized Test(s): Deferred         Specialized Test(s): Deferred          Thoracic Spine Area Exam  Skin & Axial Inspection: No masses, redness, or swelling Alignment: Symmetrical Functional ROM: Unrestricted ROM Stability: No instability detected Muscle Tone/Strength: Functionally intact. No obvious neuro-muscular anomalies detected. Sensory (Neurological): Unimpaired Muscle strength & Tone: No palpable anomalies  Lumbar Spine Area Exam  Skin & Axial Inspection: No masses, redness, or swelling Alignment: Symmetrical Functional ROM: Unrestricted ROM      Stability: No instability detected Muscle Tone/Strength: Functionally intact. No obvious neuro-muscular anomalies detected. Sensory (Neurological): Articular pain  pattern Palpation: Complains of area being tender to palpation       Provocative Tests: Lumbar Hyperextension and rotation test: Positive       Lumbar Lateral bending test: Positive       Patrick's Maneuver: evaluation deferred today                    Gait & Posture Assessment  Ambulation: Limited Gait: Antalgic Posture: WNL   Lower Extremity Exam    Side: Right lower extremity   Side: Left lower extremity  Skin & Extremity Inspection: Skin color, temperature, and hair growth are WNL. No peripheral edema or cyanosis. No masses, redness, swelling, asymmetry, or associated skin lesions. No contractures.  Skin & Extremity Inspection: Skin color, temperature, and hair growth are WNL. No peripheral edema or cyanosis. No masses, redness, swelling, asymmetry, or associated skin lesions. No contractures.  Functional ROM: Unrestricted ROM          Functional ROM: Unrestricted ROM          Muscle Tone/Strength: Functionally intact. No obvious neuro-muscular anomalies detected.  Muscle Tone/Strength: Functionally intact. No obvious neuro-muscular anomalies detected.  Sensory (Neurological): Unimpaired  Sensory (Neurological): Unimpaired  Palpation: No palpable anomalies  Palpation: No palpable anomalies   Assessment  Primary Diagnosis & Pertinent Problem List: The primary encounter diagnosis was Rheumatoid arthritis involving multiple sites, unspecified rheumatoid factor presence ([place]). Diagnoses of Lumbar spondylosis, Chronic pain syndrome, Coronary artery disease involving native coronary artery of native heart with other form of angina pectoris ([place]), S/P coronary artery stent placement, Myofascial pain, and Arthralgia, unspecified joint were also pertinent to this visit.  Visit Diagnosis (New problems to examiner): 1. Rheumatoid arthritis involving multiple sites, unspecified rheumatoid factor presence ([place])   2. Lumbar spondylosis   3. Chronic pain syndrome   4. Coronary artery disease involving native coronary artery of native heart with other form of angina pectoris ([place])   5. S/P coronary artery stent placement   6. Myofascial pain   7. Arthralgia, unspecified joint    Plan of Care (Initial workup plan)   61 year old female with a past medical history of seropositive rheumatoid arthritis diagnosed in 1996 was been on chronic opioid therapy since around that time. Patient  was previously seen by [facility] rheumatology however was having difficulty contacting and arranging follow-up with them. They were primarily prescribing her opioid medications. They tried to wean her Percocet 10 mg from 6 times a day to 5 times a day but the patient was unable to tolerate it. She did experience opioid withdrawal and had to go to the emergency department. Patient did follow up with the [facility] pain management who prescribed her a Butrans patch 10 g. Patient states that this helped for the first couple days but is not very effective. Collateral obtained from the patient's family members also indicates that patient has worse pain, decreased functionality, depressed mood as result of not being on her chronic oral opioid therapy.  The patient also had myocardial infarctions recently and has coronary stents in place. She is on antiplatelet therapy with Plavix and aspirin and is not a procedural candidate at the moment especially as it relates to her low back and hips.  Patient wants to be considered for chronic opioid therapy here. She did have an appointment with the [facility] pain clinic today but she says she canceled that because of not returning phone calls and distance. I told her that we would not exceed 60 morphine milliequivalents if she  were a candidate for opioid therapy at our clinic. On her previous regimen of Percocet, 10 mg, this would equal 120 tablets a month, 10 mg 4 times a day as necessary.  Patient could also benefit from Cymbalta given her depression and chronic pain. We will start at a dose of 30 mg daily.  Plan: #1. Urine drug screen today. Should be positive for buprenorphine and its metabolites. #2. Referral to pain psychology for risk evaluation of chronic opioid therapy. #3. Start Cymbalta 30 mg daily. #4. Recommended patient participate in aquatic therapy and swimming at her [employer] #5. Continue all other medications as prescribed including gabapentin 900 mg 3 times a day,  tizanidine 4 mg 3 times a day when necessary  Problem-specific plan: No problem-specific Assessment & Plan notes found for this encounter.  Ordered Lab-work, Procedure(s), Referral(s), & Consult(s): Orders Placed This Encounter  Procedures  . Compliance Drug Analysis, Ur  . Ambulatory referral to Psychology   Pharmacotherapy (current): Medications ordered:  Meds ordered this encounter  Medications  . DULoxetine (CYMBALTA) 30 MG capsule    Sig: Take 1 capsule (30 mg total) by mouth daily.    Dispense:  30 capsule    Refill:  1   Medications administered during this visit: Ms. Coaxum had no medications administered during this visit.   Pharmacological management options:  Opioid Analgesics: The patient was informed that there is no guarantee that she would be a candidate for opioid analgesics. The decision will be made following CDC guidelines. This decision will be based on the results of diagnostic studies, as well as Ms. Tavis's risk profile. Not to exceed 60 mg morphine milliequivalents (pending pain psych evaluation, maximum dose of allowable Percocet would be 10 mg, up to 4 times a day as needed, quantity 120 a month   Membrane stabilizer: To be determined at a later time currently on gabapentin, can consider Lyrica or Topamax in the future   Muscle relaxant: To be determined at a later time currently on tizanidine 4 mg 3 times a day. Has also tried Skelaxin patient consider baclofen or Flexeril   NSAID: Medically contraindicated,   Other analgesic(s): To be determined at a later time, Trial of Cymbalta at first visit. Can also consider TCAs.    Interventional management options: Ms. Estrada was informed that there is no guarantee that she would be a candidate for interventional therapies. The decision will be based on the results of diagnostic studies, as well as Ms. Waybright's risk profile.  Procedure(s) under consideration:  -Genicular nerve blocks bilaterally  -Lumbar medial  branch block with goal radiofrequency ablation -Intra-articular knee steroid injections. -SI joint injections bilaterally    Provider-requested follow-up: Return in about 3 weeks (around 10/07/2016) for After Psychological evaluation.  No future appointments.  Primary Care Physician: Denton Lank, MD Location: [facility] Note by: Gillis Santa, M.D, Date: 09/16/2016; Time: 1:36 PM  Patient Instructions  #1. Continue your medications as currently prescribed. It is okay for you to continue your Butrans patch. #2. Start Cymbalta at 30 mg daily. Prescription provided. #3. Urine drug screen today. Should only be positive for buprenorphine and its metabolites.  #4. Referral to pain psychology for evaluation of chronic opioid therapy. You will follow up with me after your pain psychology evaluation.

## 2016-09-16 NOTE — Patient Instructions (Signed)
#  1. Continue your medications as currently prescribed. It is okay for you to continue your Butrans patch. #2. Start Cymbalta at 30 mg daily. Prescription provided. #3. Urine drug screen today. Should only be positive for buprenorphine and its metabolites.  #4. Referral to pain psychology for evaluation of chronic opioid therapy. You will follow up with me after your pain psychology evaluation.

## 2016-09-16 NOTE — Progress Notes (Signed)
Safety precautions to be maintained throughout the outpatient stay will include: orient to surroundings, keep bed in low position, maintain call bell within reach at all times, provide assistance with transfer out of bed and ambulation.  

## 2016-09-17 ENCOUNTER — Telehealth: Payer: Self-pay | Admitting: *Deleted

## 2016-09-17 NOTE — Telephone Encounter (Signed)
Spoke with patient re; medication question, states she started Cymbalta this morning and has felt nausea the rest of the day.  Denies any other s/s other than feeling cold. Spoke with Dr Holley Raring and this can be a side affect from the medication and could get better over the week as her body is getting used to taking the medication.  Patient had asked about taking Dramamine or pepto bismol.  Instructed to not take anything that could cause a sedative type affect with the medication but that if she felt an antacid was necessary she could use this.  Patient verbalizes u/o information.

## 2016-09-18 ENCOUNTER — Emergency Department
Admission: EM | Admit: 2016-09-18 | Discharge: 2016-09-18 | Disposition: A | Discharge status: Home / Self Care | Payer: Medicare Other | Attending: Emergency Medicine | Admitting: Emergency Medicine

## 2016-09-18 ENCOUNTER — Encounter: Payer: Self-pay | Admitting: Emergency Medicine

## 2016-09-18 ENCOUNTER — Telehealth: Payer: Self-pay | Admitting: Student in an Organized Health Care Education/Training Program

## 2016-09-18 DIAGNOSIS — I10 Essential (primary) hypertension: Secondary | ICD-10-CM | POA: Diagnosis not present

## 2016-09-18 DIAGNOSIS — Z87891 Personal history of nicotine dependence: Secondary | ICD-10-CM | POA: Diagnosis not present

## 2016-09-18 DIAGNOSIS — I259 Chronic ischemic heart disease, unspecified: Secondary | ICD-10-CM | POA: Diagnosis not present

## 2016-09-18 DIAGNOSIS — Z7982 Long term (current) use of aspirin: Secondary | ICD-10-CM | POA: Diagnosis not present

## 2016-09-18 DIAGNOSIS — R1013 Epigastric pain: Secondary | ICD-10-CM | POA: Diagnosis not present

## 2016-09-18 DIAGNOSIS — R110 Nausea: Secondary | ICD-10-CM | POA: Diagnosis not present

## 2016-09-18 DIAGNOSIS — R109 Unspecified abdominal pain: Secondary | ICD-10-CM | POA: Diagnosis present

## 2016-09-18 DIAGNOSIS — Z79899 Other long term (current) drug therapy: Secondary | ICD-10-CM | POA: Insufficient documentation

## 2016-09-18 HISTORY — DX: Gastric ulcer, unspecified as acute or chronic, without hemorrhage or perforation: K25.9

## 2016-09-18 LAB — COMPREHENSIVE METABOLIC PANEL
ALT: 22 U/L (ref 14–54)
ANION GAP: 10 (ref 5–15)
AST: 24 U/L (ref 15–41)
Albumin: 4.6 g/dL (ref 3.5–5.0)
Alkaline Phosphatase: 94 U/L (ref 38–126)
BILIRUBIN TOTAL: 0.6 mg/dL (ref 0.3–1.2)
BUN: 10 mg/dL (ref 6–20)
CO2: 27 mmol/L (ref 22–32)
Calcium: 10.2 mg/dL (ref 8.9–10.3)
Chloride: 99 mmol/L — ABNORMAL LOW (ref 101–111)
Creatinine, Ser: 0.74 mg/dL (ref 0.44–1.00)
Glucose, Bld: 186 mg/dL — ABNORMAL HIGH (ref 65–99)
POTASSIUM: 4.4 mmol/L (ref 3.5–5.1)
Sodium: 136 mmol/L (ref 135–145)
TOTAL PROTEIN: 7.9 g/dL (ref 6.5–8.1)

## 2016-09-18 LAB — CBC
HEMATOCRIT: 38.2 % (ref 35.0–47.0)
Hemoglobin: 12.9 g/dL (ref 12.0–16.0)
MCH: 30.4 pg (ref 26.0–34.0)
MCHC: 33.8 g/dL (ref 32.0–36.0)
MCV: 89.7 fL (ref 80.0–100.0)
PLATELETS: 414 10*3/uL (ref 150–440)
RBC: 4.25 MIL/uL (ref 3.80–5.20)
RDW: 12.7 % (ref 11.5–14.5)
WBC: 10.5 10*3/uL (ref 3.6–11.0)

## 2016-09-18 LAB — TROPONIN I

## 2016-09-18 LAB — LIPASE, BLOOD: LIPASE: 34 U/L (ref 11–51)

## 2016-09-18 MED ORDER — SUCRALFATE 1 G PO TABS: 1.0000 g | ORAL_TABLET | Freq: Four times a day (QID) | ORAL | 1 refills | Status: DC

## 2016-09-18 MED ORDER — GI COCKTAIL ~~LOC~~
30.0000 mL | Freq: Once | ORAL | Status: AC
  Administered 2016-09-18: 30 mL via ORAL

## 2016-09-18 MED ORDER — SUCRALFATE 1 G PO TABS
1.0000 g | ORAL_TABLET | Freq: Once | ORAL | Status: AC
Start: 2016-09-18 — End: 2016-09-18
  Administered 2016-09-18: 1 g via ORAL
  Filled 2016-09-18: qty 1

## 2016-09-18 MED ORDER — GI COCKTAIL ~~LOC~~
ORAL | Status: AC
  Filled 2016-09-18: qty 30

## 2016-09-18 NOTE — Telephone Encounter (Signed)
Patient had to go to ED with stomach and they gave her some meds that helped settle stomach, ED Phys. Told her to ask if she should still take the Cymbalta? Please let patient know

## 2016-09-18 NOTE — ED Provider Notes (Signed)
Patient is feeling better, she'll be discharged with antacids and Carafate.   Earleen Newport, MD 09/18/16 [phone]

## 2016-09-18 NOTE — ED Triage Notes (Signed)
Patient with complaint of generalized abdominal pain and nausea that started yesterday afternoon. Patient states that she has a history of gastric ulcers and the pain is similar. Patient omeprazole and tums with no improvement.

## 2016-09-18 NOTE — ED Provider Notes (Signed)
[facility] Emergency Department Provider Note   ____________________________________________   First MD Initiated Contact with Patient 09/18/16 [phone]     (approximate)  I have reviewed the triage vital signs and the nursing notes.   HISTORY  Chief Complaint Abdominal Pain and Nausea    HPI Karen Cervantes is a 61 y.o. female Who comes into the hospital today with some stomach pain. The patient has a history of ulcers. She states that the last time she was here with similar pain and she was given a cocktail and it helped for her pain. She was here at the end of Mayfor opiate withdrawal. She reports that she does not want any narcotics. The patient states that her pain started yesterday.She was started on Cymbalta and the pain in her abdomen started at 1 PM. The patient initially thought it was a stomach ache but reports that it just continued to get worse. She has felt very nauseous. She took Tums and omeprazole and drink some water. She also took her blood pressure medicines. The patient reports that the pain is okay right now she just feels very nauseous. She is having some spasms in her stomach. The patient is here today for evaluation.   Past Medical History:  Diagnosis Date  . CAD (coronary artery disease)    a. NSTEMI 04/2014; b. cardiac cath 05/30/2014: ost LAD 30%, mLAD 95% s/p PCI/DES, ost RCA 60%, pRCA 50%, dRCA 60%, EF >55% by echo;  c. 12/2014 NSTEMI/PCI: LM nl, LAD 30ost, patent mid stent, D1 small, D2 min dzs, D3 small, LCX min irregs, OM1 small, OM2 min irregs, OM3 70, RCA 60ost, 40p, 42m/24m/d, 70d (3.25x28 Xience [employer]pine DES), RPDA min irregs, EF 55-65%.  . Diastolic dysfunction    a. echo 04/2014: EF >53%, no WMA, diastolic dysfunction, no valvular abnormalities, normal RVSP;  b. 12/2014 EF 55-65% by LV gram.  . Gastric ulcer   . HLD (hyperlipidemia)   . Hypertension   . Myocardial infarction [facility]) may 2016, December 2016   stents placed each time   . Obesity   . RA (rheumatoid arthritis) [facility])     Patient Active Problem List   Diagnosis Date Noted  . S/P coronary artery stent placement   . Ischemic chest pain   . Essential hypertension 06/10/2014  . CAD (coronary artery disease)   . HLD (hyperlipidemia)   . Obesity   . RA (rheumatoid arthritis) ([place])   . Diastolic dysfunction   . NSTEMI (non-ST elevated myocardial infarction) ([place]) 05/28/2014    Past Surgical History:  Procedure Laterality Date  . CARDIAC CATHETERIZATION N/A 05/30/2014   Procedure: Left Heart Cath and Coronary Angiography;  Surgeon: Wellington Hampshire, MD;  Location: [place] CV LAB;  Service: Cardiovascular;  Laterality: N/A;  . CARDIAC CATHETERIZATION N/A 05/30/2014   Procedure: Coronary Stent Intervention;  Surgeon: Wellington Hampshire, MD;  Location: [place] CV LAB;  Service: Cardiovascular;  Laterality: N/A;  . CARDIAC CATHETERIZATION N/A 01/02/2015   Procedure: Left Heart Cath and Coronary Angiography;  Surgeon: Wellington Hampshire, MD;  Location: [place] CV LAB;  Service: Cardiovascular;  Laterality: N/A;  . CARDIAC CATHETERIZATION N/A 01/02/2015   Procedure: Coronary Stent Intervention;  Surgeon: Wellington Hampshire, MD;  Location: [place] CV LAB;  Service: Cardiovascular;  Laterality: N/A;    Prior to Admission medications   Medication Sig Start Date End Date Taking? Authorizing Provider  aspirin EC 81 MG tablet Take 1 tablet (81 mg total)  by mouth daily. 05/31/14   Epifanio Lesches, MD  atorvastatin (LIPITOR) 80 MG tablet Take 80 mg by mouth every evening.    [provider]  buprenorphine (BUTRANS) 10 MCG/HR PTWK patch Place 10 mcg onto the skin once a week.    [provider]  carvedilol (COREG) 12.5 MG tablet Take 1 tablet (12.5 mg total) by mouth 2 (two) times daily with a meal. 01/03/15   Demetrios Loll, MD  clopidogrel (PLAVIX) 75 MG tablet Take 1 tablet (75 mg total) by mouth daily. 01/03/15   Demetrios Loll, MD    cyclobenzaprine (FLEXERIL) 10 MG tablet Take 10 mg by mouth at bedtime.    [provider]  DULoxetine (CYMBALTA) 30 MG capsule Take 1 capsule (30 mg total) by mouth daily. 09/16/16   Gillis Santa, MD  ezetimibe (ZETIA) 10 MG tablet Take 10 mg by mouth daily.    [provider]  fenofibrate (TRICOR) 48 MG tablet Take 1 tablet (48 mg total) by mouth daily. 07/12/14   Wellington Hampshire, MD  FOLIC ACID PO Take 1 tablet by mouth every morning.     [provider]  gabapentin (NEURONTIN) 300 MG capsule Take 600 mg by mouth 2 (two) times daily.     [provider]  lidocaine (LIDODERM) 5 % Place 1 patch onto the skin every 12 (twelve) hours. Remove & Discard patch within 12 hours or as directed by MD Patient not taking: Reported on 09/16/2016 04/12/15   Cuthriell, Charline Bills, PA-C  lisinopril (PRINIVIL,ZESTRIL) 10 MG tablet Take 1 tablet (10 mg total) by mouth daily. 01/03/15   Demetrios Loll, MD  Melatonin 5 MG CAPS Take 15 mg by mouth at bedtime.    [provider]  metaxalone (SKELAXIN) 800 MG tablet Take 800 mg by mouth 3 (three) times daily.    [provider]  Methotrexate, Anti-Rheumatic, (METHOTREXATE, PF, [place]) Inject 0.4 mLs into the skin once a week. Once a week    [provider]  Multiple Vitamins-Minerals (MULTIVITAMIN PO) Take 1 tablet by mouth every morning.    [provider]  nitroGLYCERIN (NITROSTAT) 0.4 MG SL tablet Place 1 tablet (0.4 mg total) under the tongue every 5 (five) minutes as needed for chest pain. 01/03/15   Rogelia Mire, NP  omeprazole (PRILOSEC) 20 MG capsule Take 20 mg by mouth 2 (two) times daily before a meal.    [provider]  oxyCODONE-acetaminophen (PERCOCET) 10-325 MG tablet Take 1 tablet by mouth every 6 (six) hours as needed for pain.    [provider]  pantoprazole (PROTONIX) 40 MG tablet Take 1 tablet (40 mg total) by mouth daily. Patient not taking: Reported on  01/17/2015 01/03/15   Rogelia Mire, NP  riTUXimab (RITUXAN) 100 MG/10ML injection Inject into the vein.    [provider]  sucralfate (CARAFATE) 1 G tablet Take 1 tablet (1 g total) by mouth 4 (four) times daily -  before meals and at bedtime. Patient not taking: Reported on 01/02/2015 05/31/14   Epifanio Lesches, MD  tiZANidine (ZANAFLEX) 4 MG capsule Take 4 mg by mouth 3 (three) times daily.    [provider]    Allergies Remicade [infliximab]  Family History  Problem Relation Age of Onset  . Heart attack Father   . Heart disease Father   . Heart Problems Brother   . Heart Problems Brother   . Heart Problems Brother     Social History Social History  Substance  Use Topics  . Smoking status: Former Research scientist (life sciences)  . Smokeless tobacco: Never Used  . Alcohol use No    Review of Systems  Constitutional: No fever/chills Eyes: No visual changes. ENT: No sore throat. Cardiovascular: Denies chest pain. Respiratory: Denies shortness of breath. Gastrointestinal:  abdominal pain, nausea, no vomiting.  No diarrhea.  No constipation. Genitourinary: Negative for dysuria. Musculoskeletal: Negative for back pain. Skin: Negative for rash. Neurological: Negative for headaches, focal weakness or numbness.   ____________________________________________   PHYSICAL EXAM:  VITAL SIGNS: ED Triage Vitals  Enc Vitals Group     BP 09/18/16 0622 (!) 159/73     Pulse Rate 09/18/16 0621 90     Resp 09/18/16 0621 18     Temp 09/18/16 0621 98.3 F (36.8 C)     Temp Source 09/18/16 0621 Oral     SpO2 09/18/16 0621 96 %     Weight 09/18/16 0621 204 lb (92.5 kg)     Height 09/18/16 0621 5\' 3"  (1.6 m)     Head Circumference --      Peak Flow --      Pain Score 09/18/16 0621 8     Pain Loc --      Pain Edu? --      Excl. in [place]? --     Constitutional: Alert and oriented. Well appearing and in no acute distress. Eyes: Conjunctivae are normal. PERRL. EOMI. Head:  Atraumatic. Nose: No congestion/rhinnorhea. Mouth/Throat: Mucous membranes are moist.  Oropharynx non-erythematous. Cardiovascular: Normal rate, regular rhythm. Grossly normal heart sounds.  Good peripheral circulation. Respiratory: Normal respiratory effort.  No retractions. Lungs CTAB. Gastrointestinal: Soft with epigastric tenderness to palpation. No distention. Positive bowel sounds Musculoskeletal: No lower extremity tenderness nor edema.  Neurologic:  Normal speech and language.  Skin:  Skin is warm, dry and intact.  Psychiatric: Mood and affect are normal.   ____________________________________________   LABS (all labs ordered are listed, but only abnormal results are displayed)  Labs Reviewed  CBC  COMPREHENSIVE METABOLIC PANEL  LIPASE, BLOOD  TROPONIN I   ____________________________________________  EKG  ED ECG REPORT I, Loney Hering, the attending physician, personally viewed and interpreted this ECG.   Date: 09/18/2016  EKG Time: 0648  Rate: 86  Rhythm: normal sinus rhythm  Axis: normal  Intervals:none  ST&T Change: none  ____________________________________________  RADIOLOGY  No results found.  ____________________________________________   PROCEDURES  Procedure(s) performed: None  Procedures  Critical Care performed: No  ____________________________________________   INITIAL IMPRESSION / ASSESSMENT AND PLAN / ED COURSE  Pertinent labs & imaging results that were available during my care of the patient were reviewed by me and considered in my medical decision making (see chart for details).  This is 61 year old female who comes into the hospital today with some epigastric abdominal pain. The patient states that she has ulcers and she previously had similar symptoms when she had a flare of her ulcers. I did give the patient a GI cocktail and did order some Carafate.Since her pain is epigastric I will also order a troponin and a lipase.  The patient's care will be signed out to Dr. Jimmye Norman who will follow-up the results of the blood work and reassess the patient.      ____________________________________________   FINAL CLINICAL IMPRESSION(S) / ED DIAGNOSES  Final diagnoses:  Epigastric pain      NEW MEDICATIONS STARTED DURING THIS VISIT:  New Prescriptions   No medications on file  Note:  This document was prepared using Dragon voice recognition software and may include unintentional dictation errors.    Loney Hering, MD 09/18/16 [phone]

## 2016-09-18 NOTE — Telephone Encounter (Signed)
Spoke with Dr. Holley Raring, advised patient to stop her Cymbalta. Patient notified.

## 2016-09-19 ENCOUNTER — Telehealth: Payer: Self-pay | Admitting: Student in an Organized Health Care Education/Training Program

## 2016-09-19 NOTE — Telephone Encounter (Addendum)
Patient could not tolerate Cymbalta and had to stop taking it, wants to know if she can get something like Tylenol 3 or something, please call patient  First visit was 09-16-16

## 2016-09-19 NOTE — Telephone Encounter (Signed)
Dr. Lateef please advise. 

## 2016-09-20 LAB — COMPLIANCE DRUG ANALYSIS, UR

## 2016-09-20 NOTE — Telephone Encounter (Signed)
Spoke with Patients daughter, Marye Round who was requesting some pain medication from Dr Holley Raring, due to the fact that her mother was in pain and could not tolerate the Cymbalta.  Called Dr Holley Raring at home and he informed me that the patient could get pain medication from another physician because we had not started prescribing her narcotics yet.  Informed daughter of above.  Instructed daughter to have patient bring whatever is prescribed when she comes to the appointment so that we may document and count medications.

## 2016-09-24 ENCOUNTER — Telehealth: Payer: Self-pay | Admitting: Student in an Organized Health Care Education/Training Program

## 2016-09-24 NOTE — Telephone Encounter (Signed)
Wants to speak with nurse, no reason given

## 2016-09-24 NOTE — Telephone Encounter (Signed)
Spoke with patient and let her know that we can't presribe anything until her f/up appt.  Patient states that she is going to her PCP tomorrow and wants to know if she can ask her to prescribe.  Educated patient that as of yet we have not entered into any agreement re; chronic pain management and if she would like to pursue pain medication through PCP she may.  Patient verbalizes u/o information. Will see her at her next appt on September 5.

## 2016-09-24 NOTE — Telephone Encounter (Signed)
Patient's  Daughter [place] called asking to speak with Nurse or Dr. Holley Raring as her mother is in a lot of pain and she has seen the Psychologist and cleared to get medications, they want to know how long before the patient can get pain meds, she has none at this time and needs them asap.

## 2016-09-25 ENCOUNTER — Telehealth: Payer: Self-pay | Admitting: *Deleted

## 2016-10-02 ENCOUNTER — Ambulatory Visit
Payer: Medicare Other | Attending: Student in an Organized Health Care Education/Training Program | Admitting: Student in an Organized Health Care Education/Training Program

## 2016-10-02 ENCOUNTER — Encounter: Payer: Self-pay | Admitting: Student in an Organized Health Care Education/Training Program

## 2016-10-02 VITALS — BP 148/59 | HR 72 | Temp 98.6°F | Resp 16 | Ht 63.0 in | Wt 209.0 lb

## 2016-10-02 DIAGNOSIS — G894 Chronic pain syndrome: Secondary | ICD-10-CM | POA: Diagnosis not present

## 2016-10-02 DIAGNOSIS — Z87891 Personal history of nicotine dependence: Secondary | ICD-10-CM | POA: Diagnosis not present

## 2016-10-02 DIAGNOSIS — M5412 Radiculopathy, cervical region: Secondary | ICD-10-CM | POA: Diagnosis not present

## 2016-10-02 DIAGNOSIS — M7918 Myalgia, other site: Secondary | ICD-10-CM

## 2016-10-02 DIAGNOSIS — M5126 Other intervertebral disc displacement, lumbar region: Secondary | ICD-10-CM | POA: Diagnosis not present

## 2016-10-02 DIAGNOSIS — I2510 Atherosclerotic heart disease of native coronary artery without angina pectoris: Secondary | ICD-10-CM | POA: Insufficient documentation

## 2016-10-02 DIAGNOSIS — M47816 Spondylosis without myelopathy or radiculopathy, lumbar region: Secondary | ICD-10-CM

## 2016-10-02 DIAGNOSIS — M79605 Pain in left leg: Secondary | ICD-10-CM | POA: Diagnosis not present

## 2016-10-02 DIAGNOSIS — I10 Essential (primary) hypertension: Secondary | ICD-10-CM | POA: Diagnosis not present

## 2016-10-02 DIAGNOSIS — E669 Obesity, unspecified: Secondary | ICD-10-CM | POA: Insufficient documentation

## 2016-10-02 DIAGNOSIS — Z955 Presence of coronary angioplasty implant and graft: Secondary | ICD-10-CM | POA: Diagnosis not present

## 2016-10-02 DIAGNOSIS — M069 Rheumatoid arthritis, unspecified: Secondary | ICD-10-CM | POA: Diagnosis not present

## 2016-10-02 DIAGNOSIS — M5124 Other intervertebral disc displacement, thoracic region: Secondary | ICD-10-CM | POA: Diagnosis not present

## 2016-10-02 DIAGNOSIS — E785 Hyperlipidemia, unspecified: Secondary | ICD-10-CM | POA: Diagnosis not present

## 2016-10-02 DIAGNOSIS — Z7982 Long term (current) use of aspirin: Secondary | ICD-10-CM | POA: Diagnosis not present

## 2016-10-02 DIAGNOSIS — M545 Low back pain: Secondary | ICD-10-CM | POA: Diagnosis present

## 2016-10-02 DIAGNOSIS — G8929 Other chronic pain: Secondary | ICD-10-CM | POA: Diagnosis not present

## 2016-10-02 DIAGNOSIS — M4316 Spondylolisthesis, lumbar region: Secondary | ICD-10-CM | POA: Diagnosis not present

## 2016-10-02 DIAGNOSIS — M791 Myalgia: Secondary | ICD-10-CM | POA: Diagnosis not present

## 2016-10-02 DIAGNOSIS — M2550 Pain in unspecified joint: Secondary | ICD-10-CM | POA: Diagnosis not present

## 2016-10-02 DIAGNOSIS — I25118 Atherosclerotic heart disease of native coronary artery with other forms of angina pectoris: Secondary | ICD-10-CM | POA: Diagnosis not present

## 2016-10-02 DIAGNOSIS — I214 Non-ST elevation (NSTEMI) myocardial infarction: Secondary | ICD-10-CM | POA: Insufficient documentation

## 2016-10-02 DIAGNOSIS — M48061 Spinal stenosis, lumbar region without neurogenic claudication: Secondary | ICD-10-CM | POA: Insufficient documentation

## 2016-10-02 MED ORDER — OXYCODONE-ACETAMINOPHEN 10-325 MG PO TABS: 1.0000 | ORAL_TABLET | Freq: Four times a day (QID) | ORAL | 0 refills | Status: DC | PRN

## 2016-10-02 NOTE — Progress Notes (Signed)
Patient's Name: Karen Cervantes  MRN: [id number]  Referring Provider: Denton Lank, MD  DOB: July 10, 1955  PCP: Denton Lank, MD  DOS: 10/02/2016  Note by: Gillis Santa, MD  Service setting: Ambulatory outpatient  Specialty: Interventional Pain Management  Location: [facility] (AMB) Pain Management Facility    Patient type: Established   Primary Reason(s) for Visit: Encounter for evaluation before starting new chronic pain management plan of care (Level of risk: moderate) CC: Back Pain (low)  HPI  Karen Cervantes is a 61 y.o. year old, female patient, who comes today for a follow-up evaluation to review the test results and decide on a treatment plan. She has NSTEMI (non-ST elevated myocardial infarction) ([place]); CAD (coronary artery disease); HLD (hyperlipidemia); Obesity; RA (rheumatoid arthritis) ([place]); Diastolic dysfunction; Essential hypertension; Ischemic chest pain; and S/P coronary artery stent placement on her problem list. Her primarily concern today is the Back Pain (low)  Pain Assessment: Location: Lower Back Radiating: left buttock Onset: More than a month ago Duration: Chronic pain Quality: Pressure, Discomfort Severity: 8 /10 (self-reported pain score)  Note: Reported level is compatible with observation.                   Effect on ADL:   Timing: Constant Modifying factors: medications, rest, heat  Karen Cervantes comes in today for a follow-up visit after her initial evaluation on 09/25/2016. Today we went over the results of her tests. These were explained in "Layman's terms". During today's appointment we went over my diagnostic impression, as well as the proposed treatment plan.  In considering the treatment plan options, Karen Cervantes was reminded that I no longer take patients for medication management only. I asked her to let me know if she had no intention of taking advantage of the interventional therapies, so that we could make arrangements to provide this space to someone interested. I also  made it clear that undergoing interventional therapies for the purpose of getting pain medications is very inappropriate on the part of a patient, and it will not be tolerated in this practice. This type of behavior would suggest true addiction and therefore it requires referral to an addiction specialist.   Further details on both, my assessment(s), as well as the proposed treatment plan, please see below.  Controlled Substance Pharmacotherapy Assessment REMS (Risk Evaluation and Mitigation Strategy)  Analgesic: Percocet, 10 mg, one tablet every 6 hours as needed MME/day: 50-72m/day. Pill Count: None expected due to no prior prescriptions written by our practice. SHart Rochester RN  10/02/2016 12:05 PM  Sign at close encounter Safety precautions to be maintained throughout the outpatient stay will include: orient to surroundings, keep bed in low position, maintain call bell within reach at all times, provide assistance with transfer out of bed and ambulation.    Pharmacokinetics: Liberation and absorption (onset of action): WNL Distribution (time to peak effect): WNL Metabolism and excretion (duration of action): WNL         Pharmacodynamics: Desired effects: Analgesia: Ms. CEnseyreports >50% benefit. Functional ability: Patient reports that medication allows her to accomplish basic ADLs Clinically meaningful improvement in function (CMIF): Sustained CMIF goals met Perceived effectiveness: Described as relatively effective, allowing for increase in activities of daily living (ADL) Undesirable effects: Side-effects or Adverse reactions: None reported Monitoring: [place] PMP: Online review of the past 141-montheriod previously conducted. Not applicable at this point since we have not taken over the patient's medication management yet. List of all Serum Drug Screening Test(s):  No results found for: AMPHSCRSER, BARBSCRSER, BENZOSCRSER, COCAINSCRSER, PCPSCRSER, THCSCRSER, OPIATESCRSER,  OXYSCRSER, [place] List of all UDS test(s) done:  Lab Results  Component Value Date   SUMMARY FINAL 09/16/2016   Last UDS on record: Summary  Date Value Ref Range Status  09/16/2016 FINAL  Final    Comment:    ==================================================================== TOXASSURE COMP DRUG ANALYSIS,UR ==================================================================== Test                             Result       Flag       Units Drug Present and Declared for Prescription Verification   Gabapentin                     PRESENT      EXPECTED Drug Present not Declared for Prescription Verification   Oxazepam                       125          UNEXPECTED ng/mg creat    Oxazepam may be administered as a scheduled prescription    medication; it is also an expected metabolite of other    benzodiazepine drugs, including diazepam, chlordiazepoxide,    prazepam, clorazepate, halazepam, and temazepam. Drug Absent but Declared for Prescription Verification   Oxycodone                      Not Detected UNEXPECTED ng/mg creat   Buprenorphine                  Not Detected UNEXPECTED ng/mg creat   Cyclobenzaprine                Not Detected UNEXPECTED   Tizanidine                     Not Detected UNEXPECTED    Tizanidine, as indicated in the declared medication list, is not    always detected even when used as directed.   Metaxalone                     Not Detected UNEXPECTED   Duloxetine                     Not Detected UNEXPECTED   Acetaminophen                  Not Detected UNEXPECTED    Acetaminophen, as indicated in the declared medication list, is    not always detected even when used as directed.   Salicylate                     Not Detected UNEXPECTED    Aspirin, as indicated in the declared medication list, is not    always detected even when used as directed.   Lidocaine                      Not Detected UNEXPECTED    Lidocaine, as indicated in the declared medication  list, is not    always detected even when used as directed. ==================================================================== Test                      Result    Flag   Units      Ref Range   Creatinine  24               mg/dL      >=20 ==================================================================== Declared Medications:  The flagging and interpretation on this report are based on the  following declared medications.  Unexpected results may arise from  inaccuracies in the declared medications.  **Note: The testing scope of this panel includes these medications:  Buprenorphine  Cyclobenzaprine (Flexeril)  Duloxetine (Cymbalta)  Gabapentin (Neurontin)  Metaxalone  Oxycodone (Percocet)  **Note: The testing scope of this panel does not include small to  moderate amounts of these reported medications:  Acetaminophen (Percocet)  Aspirin  Lidocaine (Lidoderm)  Tizanidine (Zanaflex)  **Note: The testing scope of this panel does not include following  reported medications:  Atorvastatin  Carvedilol  Clopidogrel  Ezetimibe (Zetia)  Fenofibrate (Tricor)  Folic acid  Lisinopril  Melatonin  Methotrexate  Multivitamin  Nitroglycerin  Omeprazole  Pantoprazole (Protonix)  Sucralfate (Carafate) ==================================================================== For clinical consultation, please call [phone]. ====================================================================    UDS interpretation: No unexpected findings.          Medication Assessment Form: Patient introduced to form today Treatment compliance: Treatment may start today if patient agrees with proposed plan. Evaluation of compliance is not applicable at this point Risk Assessment Profile: Aberrant behavior: See initial evaluations. None observed or detected today Comorbid factors increasing risk of overdose: See initial evaluation. No additional risks detected today Medical Psychology  Evaluation: Low Risk see scanned records     Opioid Risk Tool - 09/16/16 1148      Family History of Substance Abuse   Alcohol Negative   Illegal Drugs Negative   Rx Drugs Negative     Personal History of Substance Abuse   Alcohol Negative   Illegal Drugs Negative   Rx Drugs Negative     Psychological Disease   Psychological Disease Negative   Depression Negative     Total Score   Opioid Risk Tool Scoring 0   Opioid Risk Interpretation Low Risk     ORT Scoring interpretation table:  Score <3 = Low Risk for SUD  Score between 4-7 = Moderate Risk for SUD  Score >8 = High Risk for Opioid Abuse   Risk Mitigation Strategies:  Patient opioid safety counseling: Completed today. Counseling provided to patient as per "Patient Counseling Document". Document signed by patient, attesting to counseling and understanding Patient-Prescriber Agreement (PPA): Obtained today.  Controlled substance notification to other providers: Written and sent today.  Pharmacologic Plan: Today we may be taking over the patient's pharmacological regimen. See below             Laboratory Chemistry  Inflammation Markers (CRP: Acute Phase) (ESR: Chronic Phase) No results found for: CRP, ESRSEDRATE               Renal Function Markers Lab Results  Component Value Date   BUN 10 09/18/2016   CREATININE 0.74 09/18/2016   GFRAA >60 09/18/2016   GFRNONAA >60 09/18/2016                 Hepatic Function Markers Lab Results  Component Value Date   AST 24 09/18/2016   ALT 22 09/18/2016   ALBUMIN 4.6 09/18/2016   ALKPHOS 94 09/18/2016                 Electrolytes Lab Results  Component Value Date   NA 136 09/18/2016   K 4.4 09/18/2016   CL 99 (L) 09/18/2016   CALCIUM 10.2 09/18/2016  Neuropathy Markers No results found for: [id number]               Bone Pathology Markers Lab Results  Component Value Date   ALKPHOS 94 09/18/2016   CALCIUM 10.2 09/18/2016                  Coagulation Parameters Lab Results  Component Value Date   INR 1.02 01/02/2015   LABPROT 13.6 01/02/2015   APTT 35 01/02/2015   PLT 414 09/18/2016                 Cardiovascular Markers Lab Results  Component Value Date   HGB 12.9 09/18/2016   HCT 38.2 09/18/2016                 Note: Lab results reviewed.  Recent Diagnostic Imaging Review   Cervical DG complete:  Results for orders placed in visit on 05/01/09  DG Cervical Spine Complete   Narrative  PRIOR REPORT IMPORTED FROM THE [facility] WORKFLOW SYSTEM   REASON FOR EXAM:    cervical radiculopathy 723.4 arthritis rheumatoid  714.0  COMMENTS:   PROCEDURE:     DXR - DXR CERVICAL SPINE COMPLETE  - May 01 2009 11:29AM   RESULT:     The vertebral body heights are well-maintained. No fracture is  seen. There is narrowing of the C5-C6 and C6-C7 cervical disc spaces  compatible with cervical disc disease. Anterior and posterior spur  formation  is noted at these levels. In addition, there is mild spur impingement on  the  neural foramina at C6-C7 on the left and at C5-C6 and C6-C7 on the right.  The odontoid process is intact. No cervical rib formation is seen. There  is  a mild cervical scoliosis with a convexly to the left. In the lateral  view,  there is reversal of the usual lordotic curvature. This could either be  chronic or secondary to cervical muscle spasm.   IMPRESSION:  1. No fracture is seen.  2. There are changes of cervical disc disease at C5-C6 and C6-C7 as noted  above and with there being noted spur impingement on the neural foramina  at  these levels on the left and at C6-C7 on the right.  3. There is absence of the usual lordotic curvature which could be chronic  or secondary to cervical muscle spasm.   Thank you for the opportunity to contribute to the care of your patient.         Lumbosacral Imaging: Lumbar MR wo contrast:  Results for orders placed during the hospital encounter of  05/11/15  MR Lumbar Spine Wo Contrast   Narrative CLINICAL DATA:  61 year old female with lumbar back pain radiating to the left leg and foot with burning numbness and tingling. Pain with walking and some weakness for 5 weeks. Initial encounter.  EXAM: MRI LUMBAR SPINE WITHOUT CONTRAST  TECHNIQUE: Multiplanar, multisequence MR imaging of the lumbar spine was performed. No intravenous contrast was administered.  COMPARISON:  None.  FINDINGS: Lumbar segmentation appears to be normal and will be designated as such for this report. Very mild levoconvex scoliosis. Straightening of lumbar lordosis. Mild grade 1 anterolisthesis at L3 L for. No marrow edema or evidence of acute osseous abnormality.  No signal abnormality in the visualized lower thoracic spinal cord, abnormal mass effect on the cord at T11- T12 is described below. Conus medullaris appears normal at L1.  Visualized abdominal viscera and paraspinal soft tissues are within  normal limits.  T11-T12: Disc space loss, disc desiccation, and moderate size left paracentral disc extrusion (series 2, image 6) superimposed on circumferential disc bulge. Mild facet hypertrophy. Moderate spinal stenosis with mass effect on the ventral cord. No definite cord signal abnormality. No T11 foraminal involvement.  T12-L1: Minor disc desiccation and circumferential disc bulge. No stenosis.  L1-L2:  Mild mostly anterior disc bulging.  No stenosis.  L2-L3: Disc space loss with moderate circumferential disc bulge. Broad-based posterior component of disc with moderate facet and ligament flavum hypertrophy. Mild epidural lipomatosis. Moderate spinal stenosis. Borderline to mild left L2 foraminal stenosis.  L3-L4: Grade 1 anterolisthesis. Disc desiccation. Circumferential disc bulge with broad-based posterior component. Severe facet hypertrophy were thus on the right. Right side facet joint fluid. Severe spinal and right greater than left  lateral recess stenosis. Mild right greater than left L3 foraminal stenosis.  There is a moderate to large sequestered disc fragment in the left epidural space posterior to the L4 vertebral body (series 3, image 15, series 2, image 15 and series 6, image 30) encompassing 11 x 13 x 16 mm (AP by transverse by CC). I favor this originated from the L4-L5 disc space in light of the bulky left foraminal extrusion at that level (described below). Associated severe stenosis along the descending and proximal exiting left L4 nerve.  L4-L5: Vacuum disc. Circumferential disc osteophyte complex with bulky left foraminal disc extrusion best seen on series 6, image 33. Moderate to severe facet and ligament flavum hypertrophy.  Severe left L4 foraminal stenosis. Moderate spinal and bilateral lateral recess stenosis. Mild right L4 foraminal stenosis.  L5-S1: Far lateral disc osteophyte complex. Mild to moderate facet hypertrophy. Mild epidural lipomatosis. No stenosis.  IMPRESSION: 1. Grade 1 anterolisthesis at L3-L4 and advanced disc disease at L4-L5. Probably originating from the latter is a bulky sequestered disc fragment in the left epidural space posterior to the L4 vertebral body. Superimposed bulky left foraminal disc extrusion at L4-L5. Superimposed bulky facet degeneration at L3-L4. 2. Subsequent severe spinal and lateral recess stenosis at L3-L4, severe stenosis along the course of the descending and exiting left L4 nerve, and moderate spinal and bilateral lateral recess stenosis at L4-L5. 3. Fairly advanced disc and posterior element degeneration at L2-L3 resulting in moderate spinal stenosis. 4. Bulky lower thoracic disc herniation at T11-T12 resulting in spinal stenosis with moderate mass effect on the lower thoracic spinal cord, no definite cord signal abnormality.   Electronically Signed   By: Genevie Ann M.D.   On: 05/11/2015 15:14      ote: Results of ordered imaging test(s)  reviewed and explained to patient in Layman's terms. Copy of results provided to patient  Meds   Current Outpatient Prescriptions:  .  acetaminophen-codeine (TYLENOL #3) 300-30 MG tablet, Take 2 tablets by mouth 3 (three) times daily., Disp: , Rfl:  .  aspirin EC 81 MG tablet, Take 1 tablet (81 mg total) by mouth daily., Disp: 30 tablet, Rfl: 0 .  atorvastatin (LIPITOR) 80 MG tablet, Take 80 mg by mouth every evening., Disp: , Rfl:  .  buprenorphine (BUTRANS) 10 MCG/HR PTWK patch, Place 10 mcg onto the skin once a week., Disp: , Rfl:  .  carvedilol (COREG) 12.5 MG tablet, Take 1 tablet (12.5 mg total) by mouth 2 (two) times daily with a meal., Disp: 30 tablet, Rfl: 0 .  clopidogrel (PLAVIX) 75 MG tablet, Take 1 tablet (75 mg total) by mouth daily., Disp: 30 tablet, Rfl: 0 .  ezetimibe (ZETIA) 10 MG tablet, Take 10 mg by mouth daily., Disp: , Rfl:  .  fenofibrate (TRICOR) 48 MG tablet, Take 1 tablet (48 mg total) by mouth daily., Disp: 30 tablet, Rfl: 6 .  FOLIC ACID PO, Take 1 tablet by mouth every morning. , Disp: , Rfl:  .  gabapentin (NEURONTIN) 300 MG capsule, Take 600 mg by mouth 2 (two) times daily. , Disp: , Rfl:  .  lidocaine (LIDODERM) 5 %, Place 1 patch onto the skin every 12 (twelve) hours. Remove & Discard patch within 12 hours or as directed by MD, Disp: 10 patch, Rfl: 0 .  lisinopril (PRINIVIL,ZESTRIL) 10 MG tablet, Take 1 tablet (10 mg total) by mouth daily., Disp: 30 tablet, Rfl: 0 .  Melatonin 5 MG CAPS, Take 15 mg by mouth at bedtime., Disp: , Rfl:  .  metaxalone (SKELAXIN) 800 MG tablet, Take 800 mg by mouth 3 (three) times daily., Disp: , Rfl:  .  Methotrexate, Anti-Rheumatic, (METHOTREXATE, PF, [place]), Inject 0.4 mLs into the skin once a week. Once a week, Disp: , Rfl:  .  Multiple Vitamins-Minerals (MULTIVITAMIN PO), Take 1 tablet by mouth every morning., Disp: , Rfl:  .  nitroGLYCERIN (NITROSTAT) 0.4 MG SL tablet, Place 1 tablet (0.4 mg total) under the tongue every 5 (five)  minutes as needed for chest pain., Disp: 25 tablet, Rfl: 3 .  omeprazole (PRILOSEC) 20 MG capsule, Take 20 mg by mouth 2 (two) times daily before a meal., Disp: , Rfl:  .  pantoprazole (PROTONIX) 40 MG tablet, Take 1 tablet (40 mg total) by mouth daily., Disp: 30 tablet, Rfl: 6 .  riTUXimab (RITUXAN) 100 MG/10ML injection, Inject into the vein., Disp: , Rfl:  .  sucralfate (CARAFATE) 1 G tablet, Take 1 tablet (1 g total) by mouth 4 (four) times daily -  before meals and at bedtime., Disp: 60 tablet, Rfl: 0 .  sucralfate (CARAFATE) 1 g tablet, Take 1 tablet (1 g total) by mouth 4 (four) times daily., Disp: 120 tablet, Rfl: 1 .  tiZANidine (ZANAFLEX) 4 MG capsule, Take 4 mg by mouth 3 (three) times daily., Disp: , Rfl:  .  cyclobenzaprine (FLEXERIL) 10 MG tablet, Take 10 mg by mouth at bedtime., Disp: , Rfl:  .  DULoxetine (CYMBALTA) 30 MG capsule, Take 1 capsule (30 mg total) by mouth daily. (Patient not taking: Reported on 10/02/2016), Disp: 30 capsule, Rfl: 1 .  oxyCODONE-acetaminophen (PERCOCET) 10-325 MG tablet, Take 1 tablet by mouth every 6 (six) hours as needed for pain., Disp: 120 tablet, Rfl: 0  ROS  Constitutional: Denies any fever or chills Gastrointestinal: No reported hemesis, hematochezia, vomiting, or acute GI distress Musculoskeletal: Denies any acute onset joint swelling, redness, loss of ROM, or weakness Neurological: No reported episodes of acute onset apraxia, aphasia, dysarthria, agnosia, amnesia, paralysis, loss of coordination, or loss of consciousness  Allergies  Karen Cervantes is allergic to remicade [infliximab].  [place]  Drug: Karen Cervantes  reports that she does not use drugs. Alcohol:  reports that she does not drink alcohol. Tobacco:  reports that she has quit smoking. She has never used smokeless tobacco. Medical:  has a past medical history of CAD (coronary artery disease); Diastolic dysfunction; Gastric ulcer; HLD (hyperlipidemia); Hypertension; Myocardial infarction  [facility]) (may 2016, December 2016); Obesity; and RA (rheumatoid arthritis) ([place]). Surgical: Karen Cervantes  has a past surgical history that includes Cardiac catheterization (N/A, 05/30/2014); Cardiac catheterization (N/A, 05/30/2014); Cardiac catheterization (N/A, 01/02/2015); and Cardiac catheterization (N/A,  01/02/2015). Family: family history includes Heart Problems in her brother, brother, and brother; Heart attack in her father; Heart disease in her father.  Constitutional Exam  General appearance: Well nourished, well developed, and well hydrated. In no apparent acute distress Vitals:   10/02/16 1155  BP: (!) 148/59  Pulse: 72  Resp: 16  Temp: 98.6 F (37 C)  TempSrc: Oral  SpO2: 97%  Weight: 209 lb (94.8 kg)  Height: 5' 3" (1.6 m)   BMI Assessment: Estimated body mass index is 37.02 kg/m as calculated from the following:   Height as of this encounter: 5' 3" (1.6 m).   Weight as of this encounter: 209 lb (94.8 kg).  BMI interpretation table: BMI level Category Range association with higher incidence of chronic pain  <18 kg/m2 Underweight   18.5-24.9 kg/m2 Ideal body weight   25-29.9 kg/m2 Overweight Increased incidence by 20%  30-34.9 kg/m2 Obese (Class I) Increased incidence by 68%  35-39.9 kg/m2 Severe obesity (Class II) Increased incidence by 136%  >40 kg/m2 Extreme obesity (Class III) Increased incidence by 254%   BMI Readings from Last 4 Encounters:  10/02/16 37.02 kg/m  09/18/16 36.14 kg/m  02/10/16 36.14 kg/m  01/17/15 34.69 kg/m   Wt Readings from Last 4 Encounters:  10/02/16 209 lb (94.8 kg)  09/18/16 204 lb (92.5 kg)  02/10/16 204 lb (92.5 kg)  01/17/15 191 lb 3.2 oz (86.7 kg)  Psych/Mental status: Alert, oriented x 3 (person, place, & time)       Eyes: PERLA Respiratory: No evidence of acute respiratory distress  Cervical Spine Area Exam  Skin & Axial Inspection: No masses, redness, edema, swelling, or associated skin lesions Alignment:  Symmetrical Functional ROM: Unrestricted ROM      Stability: No instability detected Muscle Tone/Strength: Functionally intact. No obvious neuro-muscular anomalies detected. Sensory (Neurological): Unimpaired Palpation: No palpable anomalies              Upper Extremity (UE) Exam    Side: Right upper extremity  Side: Left upper extremity  Skin & Extremity Inspection: Skin color, temperature, and hair growth are WNL. No peripheral edema or cyanosis. No masses, redness, swelling, asymmetry, or associated skin lesions. No contractures.  Skin & Extremity Inspection: Skin color, temperature, and hair growth are WNL. No peripheral edema or cyanosis. No masses, redness, swelling, asymmetry, or associated skin lesions. No contractures.  Functional ROM: Unrestricted ROM          Functional ROM: Unrestricted ROM          Muscle Tone/Strength: Functionally intact. No obvious neuro-muscular anomalies detected.  Muscle Tone/Strength: Functionally intact. No obvious neuro-muscular anomalies detected.  Sensory (Neurological): Unimpaired          Sensory (Neurological): Unimpaired          Palpation: No palpable anomalies              Palpation: No palpable anomalies              Specialized Test(s): Deferred         Specialized Test(s): Deferred          Thoracic Spine Area Exam  Skin & Axial Inspection: No masses, redness, or swelling Alignment: Symmetrical Functional ROM: Unrestricted ROM Stability: No instability detected Muscle Tone/Strength: Functionally intact. No obvious neuro-muscular anomalies detected. Sensory (Neurological): Unimpaired Muscle strength & Tone: No palpable anomalies  Lumbar Spine Area Exam  Skin & Axial Inspection: No masses, redness, or swelling Alignment: Symmetrical Functional ROM: Unrestricted ROM  Stability: No instability detected Muscle Tone/Strength: Functionally intact. No obvious neuro-muscular anomalies detected. Sensory (Neurological): Unimpaired Palpation:  No palpable anomalies       Provocative Tests: Lumbar Hyperextension and rotation test: evaluation deferred today       Lumbar Lateral bending test: evaluation deferred today       Patrick's Maneuver: evaluation deferred today                    Gait & Posture Assessment  Ambulation: Patient ambulates using a wheel chair Gait: Not assessed patient in wheelchair Posture: WNL   Lower Extremity Exam    Side: Right lower extremity  Side: Left lower extremity  Skin & Extremity Inspection: Skin color, temperature, and hair growth are WNL. No peripheral edema or cyanosis. No masses, redness, swelling, asymmetry, or associated skin lesions. No contractures.  Skin & Extremity Inspection: Skin color, temperature, and hair growth are WNL. No peripheral edema or cyanosis. No masses, redness, swelling, asymmetry, or associated skin lesions. No contractures.  Functional ROM: Unrestricted ROM          Functional ROM: Unrestricted ROM          Muscle Tone/Strength: Functionally intact. No obvious neuro-muscular anomalies detected.  Muscle Tone/Strength: Functionally intact. No obvious neuro-muscular anomalies detected.  Sensory (Neurological): Unimpaired  Sensory (Neurological): Unimpaired  Palpation: No palpable anomalies  Palpation: No palpable anomalies   Assessment & Plan  Primary Diagnosis & Pertinent Problem List: The primary encounter diagnosis was Rheumatoid arthritis involving multiple sites, unspecified rheumatoid factor presence ([place]). Diagnoses of Lumbar spondylosis, Chronic pain syndrome, Coronary artery disease involving native coronary artery of native heart with other form of angina pectoris ([place]), S/P coronary artery stent placement, Arthralgia, unspecified joint, and Myofascial pain were also pertinent to this visit.  Visit Diagnosis: 1. Rheumatoid arthritis involving multiple sites, unspecified rheumatoid factor presence ([place])   2. Lumbar spondylosis   3. Chronic pain syndrome   4.  Coronary artery disease involving native coronary artery of native heart with other form of angina pectoris ([place])   5. S/P coronary artery stent placement   6. Arthralgia, unspecified joint   7. Myofascial pain    61 year old female with a past medical history of seropositive rheumatoid arthritis diagnosed in 1996 was been on chronic opioid therapy since around that time. Patient was previously seen by [facility] rheumatology however was having difficulty contacting and arranging follow-up with them. They were primarily prescribing her opioid medications. They tried to wean her Percocet 10 mg from 6 times a day to 5 times a day but the patient was unable to tolerate it. She did experience opioid withdrawal and had to go to the emergency department. Patient did follow up with the [facility] pain management who prescribed her a Butrans patch 10 g. Patient states that this helped for the first couple days but is not very effective. Collateral obtained from the patient's family members also indicates that patient has worse pain, decreased functionality, depressed mood as result of not being on her chronic oral opioid therapy.  Since our last visit, patient has seen pain psychology and was deemed low risk for substance abuse potential. We will start her on Percocet, 10 mg, 4 times a day as necessary, 120 tablets a month.  Plan:  1. Prescription for Percocet, 10 mg, one tablet every 6 hours as needed for severe pain, quantity 120 per month. Not to exceed this dose.  2. Continue all other medications as prescribed including gabapentin 900 mg  3 times a day, tizanidine 4 mg 3 times a day when necessary  3. Follow-up in one month   Plan of Care  Pharmacotherapy (Medications Ordered): Meds ordered this encounter  Medications  . oxyCODONE-acetaminophen (PERCOCET) 10-325 MG tablet    Sig: Take 1 tablet by mouth every 6 (six) hours as needed for pain.    Dispense:  120 tablet    Refill:  0    For Chronic Pain To  Last 30 days from fill date   Lab-work, procedure(s), and/or referral(s): Orders Placed This Encounter  Procedures  . Nursing communication    Pharmacological management options:  Membrane stabilizer: To be determined at a later time currently on gabapentin, can consider Lyrica or Topamax in the future   Muscle relaxant: To be determined at a later time currently on tizanidine 4 mg 3 times a day. Has also tried Skelaxin patient consider baclofen or Flexeril   NSAID: Medically contraindicated,   Other analgesic(s): To be determined at a later time, Trial of Cymbalta at first visit. Can also consider TCAs.       Interventional management options: Of note patient recently had an MI and is on dual antiplatelet therapy. She can be evaluated for interventions once she is off her blood thinners. Considerations below:   Considering:   -Genicular nerve blocks bilaterally  -Lumbar medial branch block with goal radiofrequency ablation -Intra-articular knee steroid injections. -SI joint injections bilaterally   PRN Procedures:   To be determined at a later time   Provider-requested follow-up: Return in about 3 weeks (around 10/23/2016) for Medication Management.  Future Appointments Date Time Provider [place]  10/22/2016 9:45 AM Gillis Santa, MD [facility] None    Primary Care Physician: Denton Lank, MD Location: [facility] Note by: Gillis Santa, M.D Date: 10/02/2016; Time: 3:46 PM  Patient Instructions  1. Rx for Percocet 10 mg every 6 hours as needed for severe pain. Rx for 1 month. STOP Butrans and Stop Tylenol #3.  2. Follow up in 3 weeks

## 2016-10-02 NOTE — Progress Notes (Signed)
Safety precautions to be maintained throughout the outpatient stay will include: orient to surroundings, keep bed in low position, maintain call bell within reach at all times, provide assistance with transfer out of bed and ambulation.  

## 2016-10-02 NOTE — Patient Instructions (Signed)
1. Rx for Percocet 10 mg every 6 hours as needed for severe pain. Rx for 1 month. STOP Butrans and Stop Tylenol #3.  2. Follow up in 3 weeks

## 2016-10-22 ENCOUNTER — Encounter: Payer: Self-pay | Admitting: Student in an Organized Health Care Education/Training Program

## 2016-10-22 ENCOUNTER — Ambulatory Visit
Payer: Medicare Other | Attending: Student in an Organized Health Care Education/Training Program | Admitting: Student in an Organized Health Care Education/Training Program

## 2016-10-22 VITALS — BP 173/69 | HR 77 | Temp 98.4°F | Resp 16 | Ht 63.0 in | Wt 211.0 lb

## 2016-10-22 DIAGNOSIS — Z955 Presence of coronary angioplasty implant and graft: Secondary | ICD-10-CM | POA: Insufficient documentation

## 2016-10-22 DIAGNOSIS — M5124 Other intervertebral disc displacement, thoracic region: Secondary | ICD-10-CM | POA: Insufficient documentation

## 2016-10-22 DIAGNOSIS — F329 Major depressive disorder, single episode, unspecified: Secondary | ICD-10-CM | POA: Insufficient documentation

## 2016-10-22 DIAGNOSIS — Z7982 Long term (current) use of aspirin: Secondary | ICD-10-CM | POA: Diagnosis not present

## 2016-10-22 DIAGNOSIS — G894 Chronic pain syndrome: Secondary | ICD-10-CM | POA: Diagnosis not present

## 2016-10-22 DIAGNOSIS — Z87891 Personal history of nicotine dependence: Secondary | ICD-10-CM | POA: Insufficient documentation

## 2016-10-22 DIAGNOSIS — I252 Old myocardial infarction: Secondary | ICD-10-CM | POA: Diagnosis not present

## 2016-10-22 DIAGNOSIS — F1123 Opioid dependence with withdrawal: Secondary | ICD-10-CM | POA: Diagnosis not present

## 2016-10-22 DIAGNOSIS — I10 Essential (primary) hypertension: Secondary | ICD-10-CM | POA: Diagnosis not present

## 2016-10-22 DIAGNOSIS — I25118 Atherosclerotic heart disease of native coronary artery with other forms of angina pectoris: Secondary | ICD-10-CM

## 2016-10-22 DIAGNOSIS — M4316 Spondylolisthesis, lumbar region: Secondary | ICD-10-CM | POA: Diagnosis not present

## 2016-10-22 DIAGNOSIS — M5126 Other intervertebral disc displacement, lumbar region: Secondary | ICD-10-CM | POA: Diagnosis not present

## 2016-10-22 DIAGNOSIS — M791 Myalgia: Secondary | ICD-10-CM | POA: Diagnosis not present

## 2016-10-22 DIAGNOSIS — I2510 Atherosclerotic heart disease of native coronary artery without angina pectoris: Secondary | ICD-10-CM | POA: Diagnosis not present

## 2016-10-22 DIAGNOSIS — M2578 Osteophyte, vertebrae: Secondary | ICD-10-CM | POA: Insufficient documentation

## 2016-10-22 DIAGNOSIS — M47816 Spondylosis without myelopathy or radiculopathy, lumbar region: Secondary | ICD-10-CM | POA: Diagnosis not present

## 2016-10-22 DIAGNOSIS — M069 Rheumatoid arthritis, unspecified: Secondary | ICD-10-CM | POA: Diagnosis not present

## 2016-10-22 DIAGNOSIS — E669 Obesity, unspecified: Secondary | ICD-10-CM | POA: Insufficient documentation

## 2016-10-22 DIAGNOSIS — M48061 Spinal stenosis, lumbar region without neurogenic claudication: Secondary | ICD-10-CM | POA: Diagnosis not present

## 2016-10-22 DIAGNOSIS — M7918 Myalgia, other site: Secondary | ICD-10-CM

## 2016-10-22 DIAGNOSIS — E785 Hyperlipidemia, unspecified: Secondary | ICD-10-CM | POA: Diagnosis not present

## 2016-10-22 DIAGNOSIS — M545 Low back pain: Secondary | ICD-10-CM | POA: Diagnosis present

## 2016-10-22 MED ORDER — OXYCODONE-ACETAMINOPHEN 10-325 MG PO TABS: 1.0000 | ORAL_TABLET | Freq: Four times a day (QID) | ORAL | 0 refills | Status: DC | PRN

## 2016-10-22 NOTE — Patient Instructions (Signed)
You were given 2 prescriptions for Percocet today. 

## 2016-10-22 NOTE — Progress Notes (Signed)
Nursing Pain Medication Assessment:  Safety precautions to be maintained throughout the outpatient stay will include: orient to surroundings, keep bed in low position, maintain call bell within reach at all times, provide assistance with transfer out of bed and ambulation.  Medication Inspection Compliance: Pill count conducted under aseptic conditions, in front of the patient. Neither the pills nor the bottle was removed from the patient's sight at any time. Once count was completed pills were immediately returned to the patient in their original bottle.  Medication: Oxycodone/APAP Pill/Patch Count: 29 of 120 pills remain Pill/Patch Appearance: Markings consistent with prescribed medication Bottle Appearance: Standard pharmacy container. Clearly labeled. Filled Date: 09/05 / 2018 Last Medication intake:  Today

## 2016-10-22 NOTE — Progress Notes (Signed)
Patient's Name: Karen Cervantes  MRN: [id number]  Referring Provider: Denton Lank, MD  DOB: [birth year]  PCP: Denton Lank, MD  DOS: 10/22/2016  Note by: Gillis Santa, MD  Service setting: Ambulatory outpatient  Specialty: Interventional Pain Management  Location: [facility] (AMB) Pain Management Facility    Patient type: Established   Primary Reason(s) for Visit: Encounter for prescription drug management. (Level of risk: moderate)  CC: Back Pain (lower)  HPI  Karen Cervantes is a 61 y.o. year old, female patient, who comes today for a medication management evaluation. She has NSTEMI (non-ST elevated myocardial infarction) ([place]); CAD (coronary artery disease); HLD (hyperlipidemia); Obesity; RA (rheumatoid arthritis) ([place]); Diastolic dysfunction; Essential hypertension; Ischemic chest pain; and S/P coronary artery stent placement on her problem list. Her primarily concern today is the Back Pain (lower)  Pain Assessment: Location: Lower Back Radiating: down both legs Onset: More than a month ago Duration: Chronic pain Quality:  (pulling) Severity: 9 /10 (self-reported pain score)  Note: Reported level is compatible with observation.                   When using our objective Pain Scale, any level above a 5/10 is said to belong in an emergency room, as it progressively worsens from a 6/10, which is described as severely limiting. Requiring emergency care not usually available at an outpatient pain management facility. Communication becomes difficult and requires great effort. Assistance to reach the emergency department may be required. Facial flushing and profuse sweating along with potentially dangerous increases in heart rate and blood pressure will be evident. Effect on ADL:   Timing: Constant Modifying factors: medications, rest , heat  [name] was last scheduled for an appointment on 10/02/2016 for medication management. During today's appointment we reviewed Karen Cervantes's chronic pain status, as well  as her outpatient medication regimen.  Patient doing stable overall. Has had some dental issues and lost a couple of teeth which is very painful for her.Her daughter is getting married this weekend which she is excited about.  Utilizing medications appropriately. We'll refill medications for 2 months.  The patient  reports that she does not use drugs. Her body mass index is 37.38 kg/m.  Further details on both, my assessment(s), as well as the proposed treatment plan, please see below.  Controlled Substance Pharmacotherapy Assessment REMS (Risk Evaluation and Mitigation Strategy)  Analgesic: Percocet, 10 mg, one tablet every 6 hours as needed MME/day: 55-42m/day.  10/02/2016 1 10/02/2016 OXYCODONE-ACETAMINOPHEN 10-325 120 30 Bi Lat [id number] [id number] 60.00 MME Comm Ins [place]  WLandis Martins RN  10/22/2016  9:53 AM  Sign at close encounter Nursing Pain Medication Assessment:  Safety precautions to be maintained throughout the outpatient stay will include: orient to surroundings, keep bed in low position, maintain call bell within reach at all times, provide assistance with transfer out of bed and ambulation.  Medication Inspection Compliance: Pill count conducted under aseptic conditions, in front of the patient. Neither the pills nor the bottle was removed from the patient's sight at any time. Once count was completed pills were immediately returned to the patient in their original bottle.  Medication: Oxycodone/APAP Pill/Patch Count: 29 of 120 pills remain Pill/Patch Appearance: Markings consistent with prescribed medication Bottle Appearance: Standard pharmacy container. Clearly labeled. Filled Date: 09/05 / 2018 Last Medication intake:  Today   Pharmacokinetics: Liberation and absorption (onset of action): WNL Distribution (time to peak effect): WNL Metabolism and excretion (duration of action): WNL  Pharmacodynamics: Desired effects: Analgesia: Karen Cervantes reports >50%  benefit. Functional ability: Patient reports that medication allows her to accomplish basic ADLs Clinically meaningful improvement in function (CMIF): Sustained CMIF goals met Perceived effectiveness: Described as relatively effective, allowing for increase in activities of daily living (ADL) Undesirable effects: Side-effects or Adverse reactions: None reported Monitoring: [place] PMP: Online review of the past 20-monthperiod conducted. Compliant with practice rules and regulations Last UDS on record: Summary  Date Value Ref Range Status  09/16/2016 FINAL  Final    Comment:    ==================================================================== TOXASSURE COMP DRUG ANALYSIS,UR ==================================================================== Test                             Result       Flag       Units Drug Present and Declared for Prescription Verification   Gabapentin                     PRESENT      EXPECTED Drug Present not Declared for Prescription Verification   Oxazepam                       125          UNEXPECTED ng/mg creat    Oxazepam may be administered as a scheduled prescription    medication; it is also an expected metabolite of other    benzodiazepine drugs, including diazepam, chlordiazepoxide,    prazepam, clorazepate, halazepam, and temazepam. Drug Absent but Declared for Prescription Verification   Oxycodone                      Not Detected UNEXPECTED ng/mg creat   Buprenorphine                  Not Detected UNEXPECTED ng/mg creat   Cyclobenzaprine                Not Detected UNEXPECTED   Tizanidine                     Not Detected UNEXPECTED    Tizanidine, as indicated in the declared medication list, is not    always detected even when used as directed.   Metaxalone                     Not Detected UNEXPECTED   Duloxetine                     Not Detected UNEXPECTED   Acetaminophen                  Not Detected UNEXPECTED    Acetaminophen, as indicated in the  declared medication list, is    not always detected even when used as directed.   Salicylate                     Not Detected UNEXPECTED    Aspirin, as indicated in the declared medication list, is not    always detected even when used as directed.   Lidocaine                      Not Detected UNEXPECTED    Lidocaine, as indicated in the declared medication list, is not    always detected even when used as directed. ==================================================================== Test  Result    Flag   Units      Ref Range   Creatinine              24               mg/dL      >=20 ==================================================================== Declared Medications:  The flagging and interpretation on this report are based on the  following declared medications.  Unexpected results may arise from  inaccuracies in the declared medications.  **Note: The testing scope of this panel includes these medications:  Buprenorphine  Cyclobenzaprine (Flexeril)  Duloxetine (Cymbalta)  Gabapentin (Neurontin)  Metaxalone  Oxycodone (Percocet)  **Note: The testing scope of this panel does not include small to  moderate amounts of these reported medications:  Acetaminophen (Percocet)  Aspirin  Lidocaine (Lidoderm)  Tizanidine (Zanaflex)  **Note: The testing scope of this panel does not include following  reported medications:  Atorvastatin  Carvedilol  Clopidogrel  Ezetimibe (Zetia)  Fenofibrate (Tricor)  Folic acid  Lisinopril  Melatonin  Methotrexate  Multivitamin  Nitroglycerin  Omeprazole  Pantoprazole (Protonix)  Sucralfate (Carafate) ==================================================================== For clinical consultation, please call [phone]. ====================================================================    UDS interpretation: Compliant          Medication Assessment Form: Reviewed. Patient indicates being compliant with  therapy Treatment compliance: Compliant Risk Assessment Profile: Aberrant behavior: See prior evaluations. None observed or detected today Comorbid factors increasing risk of overdose: See prior notes. No additional risks detected today Risk of substance use disorder (SUD): Low     Opioid Risk Tool - 09/16/16 1148      Family History of Substance Abuse   Alcohol Negative   Illegal Drugs Negative   Rx Drugs Negative     Personal History of Substance Abuse   Alcohol Negative   Illegal Drugs Negative   Rx Drugs Negative     Psychological Disease   Psychological Disease Negative   Depression Negative     Total Score   Opioid Risk Tool Scoring 0   Opioid Risk Interpretation Low Risk     ORT Scoring interpretation table:  Score <3 = Low Risk for SUD  Score between 4-7 = Moderate Risk for SUD  Score >8 = High Risk for Opioid Abuse   Risk Mitigation Strategies:  Patient Counseling: Covered Patient-Prescriber Agreement (PPA): Present and active  Notification to other healthcare providers: Done  Pharmacologic Plan: No change in therapy, at this time  Laboratory Chemistry  Inflammation Markers (CRP: Acute Phase) (ESR: Chronic Phase) No results found for: CRP, ESRSEDRATE               Renal Function Markers Lab Results  Component Value Date   BUN 10 09/18/2016   CREATININE 0.74 09/18/2016   GFRAA >60 09/18/2016   GFRNONAA >60 09/18/2016                 Hepatic Function Markers Lab Results  Component Value Date   AST 24 09/18/2016   ALT 22 09/18/2016   ALBUMIN 4.6 09/18/2016   ALKPHOS 94 09/18/2016                 Electrolytes Lab Results  Component Value Date   NA 136 09/18/2016   K 4.4 09/18/2016   CL 99 (L) 09/18/2016   CALCIUM 10.2 09/18/2016                 Neuropathy Markers No results found for: [id number]  Bone Pathology Markers Lab Results  Component Value Date   ALKPHOS 94 09/18/2016   CALCIUM 10.2 09/18/2016                  Coagulation Parameters Lab Results  Component Value Date   INR 1.02 01/02/2015   LABPROT 13.6 01/02/2015   APTT 35 01/02/2015   PLT 414 09/18/2016                 Cardiovascular Markers Lab Results  Component Value Date   HGB 12.9 09/18/2016   HCT 38.2 09/18/2016                 Note: Lab results reviewed.  Recent Diagnostic Imaging Results  MR Lumbar Spine Wo Contrast CLINICAL DATA:  61 year old female with lumbar back pain radiating to the left leg and foot with burning numbness and tingling. Pain with walking and some weakness for 5 weeks. Initial encounter.  EXAM: MRI LUMBAR SPINE WITHOUT CONTRAST  TECHNIQUE: Multiplanar, multisequence MR imaging of the lumbar spine was performed. No intravenous contrast was administered.  COMPARISON:  None.  FINDINGS: Lumbar segmentation appears to be normal and will be designated as such for this report. Very mild levoconvex scoliosis. Straightening of lumbar lordosis. Mild grade 1 anterolisthesis at L3 L for. No marrow edema or evidence of acute osseous abnormality.  No signal abnormality in the visualized lower thoracic spinal cord, abnormal mass effect on the cord at T11- T12 is described below. Conus medullaris appears normal at L1.  Visualized abdominal viscera and paraspinal soft tissues are within normal limits.  T11-T12: Disc space loss, disc desiccation, and moderate size left paracentral disc extrusion (series 2, image 6) superimposed on circumferential disc bulge. Mild facet hypertrophy. Moderate spinal stenosis with mass effect on the ventral cord. No definite cord signal abnormality. No T11 foraminal involvement.  T12-L1: Minor disc desiccation and circumferential disc bulge. No stenosis.  L1-L2:  Mild mostly anterior disc bulging.  No stenosis.  L2-L3: Disc space loss with moderate circumferential disc bulge. Broad-based posterior component of disc with moderate facet and ligament flavum hypertrophy.  Mild epidural lipomatosis. Moderate spinal stenosis. Borderline to mild left L2 foraminal stenosis.  L3-L4: Grade 1 anterolisthesis. Disc desiccation. Circumferential disc bulge with broad-based posterior component. Severe facet hypertrophy were thus on the right. Right side facet joint fluid. Severe spinal and right greater than left lateral recess stenosis. Mild right greater than left L3 foraminal stenosis.  There is a moderate to large sequestered disc fragment in the left epidural space posterior to the L4 vertebral body (series 3, image 15, series 2, image 15 and series 6, image 30) encompassing 11 x 13 x 16 mm (AP by transverse by CC). I favor this originated from the L4-L5 disc space in light of the bulky left foraminal extrusion at that level (described below). Associated severe stenosis along the descending and proximal exiting left L4 nerve.  L4-L5: Vacuum disc. Circumferential disc osteophyte complex with bulky left foraminal disc extrusion best seen on series 6, image 33. Moderate to severe facet and ligament flavum hypertrophy.  Severe left L4 foraminal stenosis. Moderate spinal and bilateral lateral recess stenosis. Mild right L4 foraminal stenosis.  L5-S1: Far lateral disc osteophyte complex. Mild to moderate facet hypertrophy. Mild epidural lipomatosis. No stenosis.  IMPRESSION: 1. Grade 1 anterolisthesis at L3-L4 and advanced disc disease at L4-L5. Probably originating from the latter is a bulky sequestered disc fragment in the left epidural space posterior to the L4 vertebral body.  Superimposed bulky left foraminal disc extrusion at L4-L5. Superimposed bulky facet degeneration at L3-L4. 2. Subsequent severe spinal and lateral recess stenosis at L3-L4, severe stenosis along the course of the descending and exiting left L4 nerve, and moderate spinal and bilateral lateral recess stenosis at L4-L5. 3. Fairly advanced disc and posterior element degeneration at  L2-L3 resulting in moderate spinal stenosis. 4. Bulky lower thoracic disc herniation at T11-T12 resulting in spinal stenosis with moderate mass effect on the lower thoracic spinal cord, no definite cord signal abnormality.  Electronically Signed   By: Genevie Ann M.D.   On: 05/11/2015 15:14  Note: Imaging results reviewed.          Meds   Current Outpatient Prescriptions:  .  aspirin EC 81 MG tablet, Take 1 tablet (81 mg total) by mouth daily., Disp: 30 tablet, Rfl: 0 .  atorvastatin (LIPITOR) 80 MG tablet, Take 80 mg by mouth every evening., Disp: , Rfl:  .  carvedilol (COREG) 12.5 MG tablet, Take 1 tablet (12.5 mg total) by mouth 2 (two) times daily with a meal., Disp: 30 tablet, Rfl: 0 .  clopidogrel (PLAVIX) 75 MG tablet, Take 1 tablet (75 mg total) by mouth daily., Disp: 30 tablet, Rfl: 0 .  ezetimibe (ZETIA) 10 MG tablet, Take 10 mg by mouth daily., Disp: , Rfl:  .  fenofibrate (TRICOR) 48 MG tablet, Take 1 tablet (48 mg total) by mouth daily., Disp: 30 tablet, Rfl: 6 .  FOLIC ACID PO, Take 1 tablet by mouth every morning. , Disp: , Rfl:  .  gabapentin (NEURONTIN) 300 MG capsule, Take 600 mg by mouth 2 (two) times daily. , Disp: , Rfl:  .  lisinopril (PRINIVIL,ZESTRIL) 10 MG tablet, Take 1 tablet (10 mg total) by mouth daily., Disp: 30 tablet, Rfl: 0 .  Melatonin 5 MG CAPS, Take 15 mg by mouth at bedtime., Disp: , Rfl:  .  Methotrexate, Anti-Rheumatic, (METHOTREXATE, PF, [place]), Inject 0.4 mLs into the skin once a week. Once a week, Disp: , Rfl:  .  Multiple Vitamins-Minerals (MULTIVITAMIN PO), Take 1 tablet by mouth every morning., Disp: , Rfl:  .  nitroGLYCERIN (NITROSTAT) 0.4 MG SL tablet, Place 1 tablet (0.4 mg total) under the tongue every 5 (five) minutes as needed for chest pain., Disp: 25 tablet, Rfl: 3 .  omeprazole (PRILOSEC) 20 MG capsule, Take 20 mg by mouth 2 (two) times daily before a meal., Disp: , Rfl:  .  oxyCODONE-acetaminophen (PERCOCET) 10-325 MG tablet, Take 1 tablet  by mouth every 6 (six) hours as needed for pain., Disp: 120 tablet, Rfl: 0 .  riTUXimab (RITUXAN) 100 MG/10ML injection, Inject into the vein., Disp: , Rfl:  .  sucralfate (CARAFATE) 1 G tablet, Take 1 tablet (1 g total) by mouth 4 (four) times daily -  before meals and at bedtime., Disp: 60 tablet, Rfl: 0 .  tiZANidine (ZANAFLEX) 4 MG capsule, Take 4 mg by mouth 3 (three) times daily., Disp: , Rfl:  .  acetaminophen-codeine (TYLENOL #3) 300-30 MG tablet, Take 2 tablets by mouth 3 (three) times daily., Disp: , Rfl:  .  cyclobenzaprine (FLEXERIL) 10 MG tablet, Take 10 mg by mouth at bedtime., Disp: , Rfl:  .  pantoprazole (PROTONIX) 40 MG tablet, Take 1 tablet (40 mg total) by mouth daily. (Patient not taking: Reported on 10/22/2016), Disp: 30 tablet, Rfl: 6 .  sucralfate (CARAFATE) 1 g tablet, Take 1 tablet (1 g total) by mouth 4 (four) times daily. (Patient not  taking: Reported on 10/22/2016), Disp: 120 tablet, Rfl: 1  ROS  Constitutional: Denies any fever or chills Gastrointestinal: No reported hemesis, hematochezia, vomiting, or acute GI distress Musculoskeletal: Denies any acute onset joint swelling, redness, loss of ROM, or weakness Neurological: No reported episodes of acute onset apraxia, aphasia, dysarthria, agnosia, amnesia, paralysis, loss of coordination, or loss of consciousness  Allergies  Ms. Clouatre is allergic to remicade [infliximab].  [place]  Drug: Ms. Darden  reports that she does not use drugs. Alcohol:  reports that she does not drink alcohol. Tobacco:  reports that she has quit smoking. She has never used smokeless tobacco. Medical:  has a past medical history of CAD (coronary artery disease); Diastolic dysfunction; Gastric ulcer; HLD (hyperlipidemia); Hypertension; Myocardial infarction [facility]) (may 2016, December 2016); Obesity; and RA (rheumatoid arthritis) ([place]). Surgical: Ms. Wurtzel  has a past surgical history that includes Cardiac catheterization (N/A, 05/30/2014); Cardiac  catheterization (N/A, 05/30/2014); Cardiac catheterization (N/A, 01/02/2015); and Cardiac catheterization (N/A, 01/02/2015). Family: family history includes Heart Problems in her brother, brother, and brother; Heart attack in her father; Heart disease in her father.  Constitutional Exam  General appearance: Well nourished, well developed, and well hydrated. In no apparent acute distress Vitals:   10/22/16 0944  BP: (!) 173/69  Pulse: 77  Resp: 16  Temp: 98.4 F (36.9 C)  TempSrc: Oral  SpO2: 98%  Weight: 211 lb (95.7 kg)  Height: _0  (1.6 m)   BMI Assessment: Estimated body mass index is 37.38 kg/m as calculated from the following:   Height as of this encounter: _1  (1.6 m).   Weight as of this encounter: 211 lb (95.7 kg).  BMI interpretation table: BMI level Category Range association with higher incidence of chronic pain  <18 kg/m2 Underweight   18.5-24.9 kg/m2 Ideal body weight   25-29.9 kg/m2 Overweight Increased incidence by 20%  30-34.9 kg/m2 Obese (Class I) Increased incidence by 68%  35-39.9 kg/m2 Severe obesity (Class II) Increased incidence by 136%  >40 kg/m2 Extreme obesity (Class III) Increased incidence by 254%   BMI Readings from Last 4 Encounters:  10/22/16 37.38 kg/m  10/02/16 37.02 kg/m  09/18/16 36.14 kg/m  02/10/16 36.14 kg/m   Wt Readings from Last 4 Encounters:  10/22/16 211 lb (95.7 kg)  10/02/16 209 lb (94.8 kg)  09/18/16 204 lb (92.5 kg)  02/10/16 204 lb (92.5 kg)  Psych/Mental status: Alert, oriented x 3 (person, place, & time)       Eyes: PERLA Respiratory: No evidence of acute respiratory distress  Cervical Spine Area Exam  Skin & Axial Inspection: No masses, redness, edema, swelling, or associated skin lesions Alignment: Symmetrical Functional ROM: Unrestricted ROM      Stability: No instability detected Muscle Tone/Strength: Functionally intact. No obvious neuro-muscular anomalies detected. Sensory (Neurological):  Unimpaired Palpation: No palpable anomalies              Upper Extremity (UE) Exam    Side: Right upper extremity  Side: Left upper extremity  Skin & Extremity Inspection: Skin color, temperature, and hair growth are WNL. No peripheral edema or cyanosis. No masses, redness, swelling, asymmetry, or associated skin lesions. No contractures.  Skin & Extremity Inspection: Skin color, temperature, and hair growth are WNL. No peripheral edema or cyanosis. No masses, redness, swelling, asymmetry, or associated skin lesions. No contractures.  Functional ROM: Unrestricted ROM          Functional ROM: Unrestricted ROM  Muscle Tone/Strength: Functionally intact. No obvious neuro-muscular anomalies detected.  Muscle Tone/Strength: Functionally intact. No obvious neuro-muscular anomalies detected.  Sensory (Neurological): Unimpaired          Sensory (Neurological): Unimpaired          Palpation: No palpable anomalies              Palpation: No palpable anomalies              Specialized Test(s): Deferred         Specialized Test(s): Deferred          Thoracic Spine Area Exam  Skin & Axial Inspection: No masses, redness, or swelling Alignment: Symmetrical Functional ROM: Unrestricted ROM Stability: No instability detected Muscle Tone/Strength: Functionally intact. No obvious neuro-muscular anomalies detected. Sensory (Neurological): Unimpaired Muscle strength & Tone: No palpable anomalies  Lumbar Spine Area Exam  Skin & Axial Inspection: No masses, redness, or swelling Alignment: Symmetrical Functional ROM: Unrestricted ROM      Stability: No instability detected Muscle Tone/Strength: Functionally intact. No obvious neuro-muscular anomalies detected. Sensory (Neurological): Unimpaired Palpation: No palpable anomalies       Provocative Tests: Lumbar Hyperextension and rotation test: evaluation deferred today       Lumbar Lateral bending test: evaluation deferred today       Patrick's  Maneuver: evaluation deferred today                     Gait & Posture Assessment  Ambulation: assisted Gait: antalgic, limping Posture: WNL     Lower Extremity Exam    Side: Right lower extremity  Side: Left lower extremity  Skin & Extremity Inspection: Skin color, temperature, and hair growth are WNL. No peripheral edema or cyanosis. No masses, redness, swelling, asymmetry, or associated skin lesions. No contractures.  Skin & Extremity Inspection: Skin color, temperature, and hair growth are WNL. No peripheral edema or cyanosis. No masses, redness, swelling, asymmetry, or associated skin lesions. No contractures.  Functional ROM: Unrestricted ROM          Functional ROM: Unrestricted ROM          Muscle Tone/Strength: Functionally intact. No obvious neuro-muscular anomalies detected.  Muscle Tone/Strength: Functionally intact. No obvious neuro-muscular anomalies detected.  Sensory (Neurological): Unimpaired  Sensory (Neurological): Unimpaired  Palpation: No palpable anomalies  Palpation: No palpable anomalies   Assessment  Primary Diagnosis & Pertinent Problem List: The primary encounter diagnosis was Rheumatoid arthritis involving multiple sites, unspecified rheumatoid factor presence ([place]). Diagnoses of Lumbar spondylosis, Myofascial pain, Chronic pain syndrome, Coronary artery disease involving native coronary artery of native heart with other form of angina pectoris ([place]), and S/P coronary artery stent placement were also pertinent to this visit.  Status Diagnosis  Stable Stable Stable 1. Rheumatoid arthritis involving multiple sites, unspecified rheumatoid factor presence ([place])   2. Lumbar spondylosis   3. Myofascial pain   4. Chronic pain syndrome   5. Coronary artery disease involving native coronary artery of native heart with other form of angina pectoris ([place])   6. S/P coronary artery stent placement      61 year old female with a past medical history of seropositive  rheumatoid arthritis diagnosed in 1996 was been on chronic opioid therapy since around that time. Patient was previously seen by [facility] rheumatology however was having difficulty contacting and arranging follow-up with them. They were primarily prescribing her opioid medications. They tried to wean her Percocet 10 mg from 6 times a day to 5 times a  day but the patient was unable to tolerate it. She did experience opioid withdrawal and had to go to the emergency department. Patient did follow up with the [facility] pain management who prescribed her a Butrans patch 10 g. Patient states that this helped for the first couple days but is not very effective. Collateral obtained from the patient's family members also indicates that patient has worse pain, decreased functionality, depressed mood as result of not being on her chronic oral opioid therapy. Has seen pain psychology and was deemed low risk for substance abuse potential.  Presents today doing relatively well, no significant changes in her past medical history. Patient states that medications help manage her pain, help her perform her activities of daily living including taking care of her daughter and granddaughter.  Plan: -Refill provided, Percocet 10 mg 1 tablet every 6 hours as needed for severe pain, quantity 120 month. Prescription provided for 2 months. Not to exceed this dose. -Continue all other medications as prescribed including gabapentin 900 mg 3 times a day, tizanidine 4 mg 3 times a day when necessary -Discussion of interventional procedures including genicular nerve blocks after patient is off of Plavix -follow-up in 6 wks  Requested Prescriptions   Signed Prescriptions Disp Refills  . oxyCODONE-acetaminophen (PERCOCET) 10-325 MG tablet 120 tablet 0    Sig: Take 1 tablet by mouth every 6 (six) hours as needed for pain.  DNF 10/31/16, 11/30/16  Plan of Care  Pharmacological options: Membrane stabilizer:To be determined at a later  timecurrently on gabapentin, can consider Lyrica or Topamax in the future   Muscle relaxant:has tried Skelaxin, Flexeril, tizanidine. Can consider baclofen.  NSAID:Medically contraindicated,   Other analgesic(s):To be determined at a later time, Trial of Cymbalta at first visit. Can also consider TCAs.     Interventional management options: recent MI and is on dual antiplatelet therapy. She can be evaluated for interventions once she is off her blood thinners. Considerations below:   Considering:   -Genicular nerve blocks bilaterally  -Lumbar medial branch block with goal radiofrequency ablation -Intra-articular knee steroid injections. -SI joint injections bilaterally     Provider-requested follow-up: Return in about 6 weeks (around 12/03/2016) for Medication Management.  No future appointments.  Primary Care Physician: Denton Lank, MD Location: [facility] Note by: Gillis Santa, M.D Date: 10/22/2016; Time: 10:16 AM  Patient Instructions  You were given 2 prescriptions for Percocet today.

## 2016-11-21 ENCOUNTER — Emergency Department
Admission: EM | Admit: 2016-11-21 | Discharge: 2016-11-21 | Disposition: A | Discharge status: Home / Self Care | Payer: Medicare Other | Attending: Emergency Medicine | Admitting: Emergency Medicine

## 2016-11-21 DIAGNOSIS — I5030 Unspecified diastolic (congestive) heart failure: Secondary | ICD-10-CM | POA: Insufficient documentation

## 2016-11-21 DIAGNOSIS — M069 Rheumatoid arthritis, unspecified: Secondary | ICD-10-CM | POA: Diagnosis not present

## 2016-11-21 DIAGNOSIS — Z87891 Personal history of nicotine dependence: Secondary | ICD-10-CM | POA: Insufficient documentation

## 2016-11-21 DIAGNOSIS — Z7901 Long term (current) use of anticoagulants: Secondary | ICD-10-CM | POA: Diagnosis not present

## 2016-11-21 DIAGNOSIS — I2510 Atherosclerotic heart disease of native coronary artery without angina pectoris: Secondary | ICD-10-CM | POA: Insufficient documentation

## 2016-11-21 DIAGNOSIS — I252 Old myocardial infarction: Secondary | ICD-10-CM | POA: Insufficient documentation

## 2016-11-21 DIAGNOSIS — R1013 Epigastric pain: Secondary | ICD-10-CM | POA: Diagnosis present

## 2016-11-21 DIAGNOSIS — Z79899 Other long term (current) drug therapy: Secondary | ICD-10-CM | POA: Insufficient documentation

## 2016-11-21 DIAGNOSIS — Z7982 Long term (current) use of aspirin: Secondary | ICD-10-CM | POA: Diagnosis not present

## 2016-11-21 DIAGNOSIS — Z955 Presence of coronary angioplasty implant and graft: Secondary | ICD-10-CM | POA: Insufficient documentation

## 2016-11-21 DIAGNOSIS — I110 Hypertensive heart disease with heart failure: Secondary | ICD-10-CM | POA: Diagnosis not present

## 2016-11-21 LAB — CBC WITH DIFFERENTIAL/PLATELET
BASOS ABS: 0.1 10*3/uL (ref 0–0.1)
Basophils Relative: 1 %
EOS ABS: 0.1 10*3/uL (ref 0–0.7)
EOS PCT: 1 %
HCT: 36 % (ref 35.0–47.0)
Hemoglobin: 12.4 g/dL (ref 12.0–16.0)
Lymphocytes Relative: 15 %
Lymphs Abs: 1.6 10*3/uL (ref 1.0–3.6)
MCH: 31.3 pg (ref 26.0–34.0)
MCHC: 34.4 g/dL (ref 32.0–36.0)
MCV: 90.9 fL (ref 80.0–100.0)
Monocytes Absolute: 0.7 10*3/uL (ref 0.2–0.9)
Monocytes Relative: 7 %
Neutro Abs: 8.1 10*3/uL — ABNORMAL HIGH (ref 1.4–6.5)
Neutrophils Relative %: 76 %
PLATELETS: 349 10*3/uL (ref 150–440)
RBC: 3.97 MIL/uL (ref 3.80–5.20)
RDW: 12.8 % (ref 11.5–14.5)
WBC: 10.6 10*3/uL (ref 3.6–11.0)

## 2016-11-21 LAB — TROPONIN I

## 2016-11-21 LAB — COMPREHENSIVE METABOLIC PANEL
ALT: 18 U/L (ref 14–54)
AST: 22 U/L (ref 15–41)
Albumin: 4.2 g/dL (ref 3.5–5.0)
Alkaline Phosphatase: 78 U/L (ref 38–126)
Anion gap: 9 (ref 5–15)
BILIRUBIN TOTAL: 0.6 mg/dL (ref 0.3–1.2)
BUN: 7 mg/dL (ref 6–20)
CALCIUM: 9.4 mg/dL (ref 8.9–10.3)
CO2: 27 mmol/L (ref 22–32)
CREATININE: 0.64 mg/dL (ref 0.44–1.00)
Chloride: 105 mmol/L (ref 101–111)
GFR calc Af Amer: >60 mL/min (ref >60)
Glucose, Bld: 131 mg/dL — ABNORMAL HIGH (ref 65–99)
Potassium: 3.7 mmol/L (ref 3.5–5.1)
Sodium: 141 mmol/L (ref 135–145)
TOTAL PROTEIN: 7.5 g/dL (ref 6.5–8.1)

## 2016-11-21 LAB — LIPASE, BLOOD: Lipase: 21 U/L (ref 11–51)

## 2016-11-21 MED ORDER — LIDOCAINE VISCOUS 2 % MT SOLN: 20.0000 mL | OROMUCOSAL | 0 refills | Status: DC | PRN

## 2016-11-21 MED ORDER — FAMOTIDINE IN NACL 20-0.9 MG/50ML-% IV SOLN
20.0000 mg | Freq: Once | INTRAVENOUS | Status: AC
  Administered 2016-11-21: 20 mg via INTRAVENOUS
  Filled 2016-11-21: qty 50

## 2016-11-21 MED ORDER — GI COCKTAIL ~~LOC~~
30.0000 mL | Freq: Once | ORAL | Status: AC
  Administered 2016-11-21: 30 mL via ORAL
  Filled 2016-11-21: qty 30

## 2016-11-21 MED ORDER — ALUM & MAG HYDROXIDE-SIMETH 400-400-40 MG/5ML PO SUSP: 5.0000 mL | Freq: Four times a day (QID) | ORAL | 0 refills | Status: DC | PRN

## 2016-11-21 MED ORDER — CLINDAMYCIN HCL 300 MG PO CAPS: 300.0000 mg | ORAL_CAPSULE | Freq: Three times a day (TID) | ORAL | 0 refills | Status: AC

## 2016-11-21 NOTE — ED Triage Notes (Signed)
Pt in with co epigastric pain that started yesterday shortly after taking augmentin that she was given after a dental procedure. Denies any n.v.d or dysuria.

## 2016-11-21 NOTE — Discharge Instructions (Signed)
Stop taking Augmentin, take the clindamycin as directed, if you have black stool, vomiting, bloody vomit, increased pain, fever, chest pain, shortness of breath, or there are any other concerns please return to the emergency room, otherwise follow closely with primary care.  Take antibiotics with food.

## 2016-11-21 NOTE — ED Provider Notes (Addendum)
[facility] Emergency Department Provider Note  ____________________________________________   I have reviewed the triage vital signs and the nursing notes.   HISTORY  Chief Complaint Abdominal Pain    HPI Karen Cervantes is a 61 y.o. female with a history of chronic pain, CAD, chronic epigastric recurrent abdominal pain, who presents today with epigastric pain which is similar to multiple episodes.  She has never had an endoscopy.  She is on chronic pain medication.  Patient usually takes a significant number of Percocet every month.  Review of the [facility] DEA provider database and prior visits to the pain clinic suggest the patient is being weaned down of Percocet.  She states she has not taken a Percocet in the last 3 days because she is taking Tylenol 3 which was prescribed by her dentist for her dental pain.  She has had withdrawal symptoms from going off Percocet in the past.  They have decreased her monthly Percocet levels.  Patient has not followed up with the pain clinic it appears since her initial visit when they did not give her significant narcotics.  Patient has no history of abdominal surgeries she states.  She does have a "sour stomach".  She states that she took amoxicillin for a dental issue last night and 3 hours later she started having epigastric abdominal pain.  Does not feel like ACS.  No chest pain or shortness of breath no nausea no vomiting.  She thinks a GI cocktail will help her.  It is a burning sensation.  No radiation.  Milk made it better, antibiotics made it worse.  He used to be on Carafate for this pain.  She has had no melena no bright red blood per rectum or fever.  Past Medical History:  Diagnosis Date  . CAD (coronary artery disease)    a. NSTEMI 04/2014; b. cardiac cath 05/30/2014: ost LAD 30%, mLAD 95% s/p PCI/DES, ost RCA 60%, pRCA 50%, dRCA 60%, EF >55% by echo;  c. 12/2014 NSTEMI/PCI: LM nl, LAD 30ost, patent mid stent, D1 small, D2 min  dzs, D3 small, LCX min irregs, OM1 small, OM2 min irregs, OM3 70, RCA 60ost, 40p, 59m/82m/d, 70d (3.25x28 Xience Alpine DES), RPDA min irregs, EF 55-65%.  . Diastolic dysfunction    a. echo 04/2014: EF >16%, no WMA, diastolic dysfunction, no valvular abnormalities, normal RVSP;  b. 12/2014 EF 55-65% by LV gram.  . Gastric ulcer   . HLD (hyperlipidemia)   . Hypertension   . Myocardial infarction [facility]) may 2016, December 2016   stents placed each time  . Obesity   . RA (rheumatoid arthritis) [facility])     Patient Active Problem List   Diagnosis Date Noted  . S/P coronary artery stent placement   . Ischemic chest pain   . Essential hypertension 06/10/2014  . CAD (coronary artery disease)   . HLD (hyperlipidemia)   . Obesity   . RA (rheumatoid arthritis) ([place])   . Diastolic dysfunction   . NSTEMI (non-ST elevated myocardial infarction) ([place]) 05/28/2014    Past Surgical History:  Procedure Laterality Date  . CARDIAC CATHETERIZATION N/A 05/30/2014   Procedure: Left Heart Cath and Coronary Angiography;  Surgeon: Wellington Hampshire, MD;  Location: [place] [facility];  Service: Cardiovascular;  Laterality: N/A;  . CARDIAC CATHETERIZATION N/A 05/30/2014   Procedure: Coronary Stent Intervention;  Surgeon: Wellington Hampshire, MD;  Location: [place] CV [facility];  Service: Cardiovascular;  Laterality: N/A;  . CARDIAC CATHETERIZATION N/A 01/02/2015  Procedure: Left Heart Cath and Coronary Angiography;  Surgeon: Wellington Hampshire, MD;  Location: [place] [facility];  Service: Cardiovascular;  Laterality: N/A;  . CARDIAC CATHETERIZATION N/A 01/02/2015   Procedure: Coronary Stent Intervention;  Surgeon: Wellington Hampshire, MD;  Location: [place] [facility];  Service: Cardiovascular;  Laterality: N/A;    Prior to Admission medications   Medication Sig Start Date End Date Taking? Authorizing Provider  aspirin EC 81 MG tablet Take 1 tablet (81 mg total) by mouth daily. 05/31/14  Yes Epifanio Lesches, MD   carvedilol (COREG) 12.5 MG tablet Take 1 tablet (12.5 mg total) by mouth 2 (two) times daily with a meal. 01/03/15  Yes Demetrios Loll, MD  clopidogrel (PLAVIX) 75 MG tablet Take 1 tablet (75 mg total) by mouth daily. 01/03/15  Yes Demetrios Loll, MD  fenofibrate (TRICOR) 48 MG tablet Take 1 tablet (48 mg total) by mouth daily. 07/12/14  Yes Wellington Hampshire, MD  acetaminophen-codeine (TYLENOL #3) 300-30 MG tablet Take 2 tablets by mouth 3 (three) times daily.    [provider]  atorvastatin (LIPITOR) 80 MG tablet Take 80 mg by mouth every evening.    [provider]  cyclobenzaprine (FLEXERIL) 10 MG tablet Take 10 mg by mouth at bedtime.    [provider]  ezetimibe (ZETIA) 10 MG tablet Take 10 mg by mouth daily.    [provider]  FOLIC ACID PO Take 1 tablet by mouth every morning.     [provider]  gabapentin (NEURONTIN) 300 MG capsule Take 600 mg by mouth 2 (two) times daily.     [provider]  lisinopril (PRINIVIL,ZESTRIL) 10 MG tablet Take 1 tablet (10 mg total) by mouth daily. 01/03/15   Demetrios Loll, MD  Melatonin 5 MG CAPS Take 15 mg by mouth at bedtime.    [provider]  Methotrexate, Anti-Rheumatic, (METHOTREXATE, PF, [place]) Inject 0.4 mLs into the skin once a week. Once a week    [provider]  Multiple Vitamins-Minerals (MULTIVITAMIN PO) Take 1 tablet by mouth every morning.    [provider]  nitroGLYCERIN (NITROSTAT) 0.4 MG SL tablet Place 1 tablet (0.4 mg total) under the tongue every 5 (five) minutes as needed for chest pain. 01/03/15   Rogelia Mire, NP  omeprazole (PRILOSEC) 20 MG capsule Take 20 mg by mouth 2 (two) times daily before a meal.    [provider]  oxyCODONE-acetaminophen (PERCOCET) 10-325 MG tablet Take 1 tablet by mouth every 6 (six) hours as needed for pain. 10/22/16   Gillis Santa, MD  pantoprazole (PROTONIX) 40 MG tablet Take 1 tablet (40 mg total) by mouth  daily. Patient not taking: Reported on 10/22/2016 01/03/15   Rogelia Mire, NP  riTUXimab (RITUXAN) 100 MG/10ML injection Inject into the vein.    [provider]  sucralfate (CARAFATE) 1 G tablet Take 1 tablet (1 g total) by mouth 4 (four) times daily -  before meals and at bedtime. 05/31/14   Epifanio Lesches, MD  sucralfate (CARAFATE) 1 g tablet Take 1 tablet (1 g total) by mouth 4 (four) times daily. Patient not taking: Reported on 10/22/2016 09/18/16 09/18/17  Earleen Newport, MD  tiZANidine (ZANAFLEX) 4 MG capsule Take 4 mg by mouth 3 (three) times daily.    [provider]    Allergies Remicade [infliximab]  Family History  Problem Relation Age of Onset  . Heart attack Father   . Heart disease Father   .  Heart Problems Brother   . Heart Problems Brother   . Heart Problems Brother     Social History Social History  Substance Use Topics  . Smoking status: Former Research scientist (life sciences)  . Smokeless tobacco: Never Used  . Alcohol use No    Review of Systems Constitutional: No fever/chills Eyes: No visual changes. ENT: No sore throat. No stiff neck no neck pain Cardiovascular: Denies chest pain. Respiratory: Denies shortness of breath. Gastrointestinal:   no vomiting.  No diarrhea.  No constipation. Genitourinary: Negative for dysuria. Musculoskeletal: Negative lower extremity swelling Skin: Negative for rash. Neurological: Negative for severe headaches, focal weakness or numbness.   ____________________________________________   PHYSICAL EXAM:  VITAL SIGNS: ED Triage Vitals  Enc Vitals Group     BP 11/21/16 0636 (!) 180/79     Pulse Rate 11/21/16 0636 87     Resp 11/21/16 0636 20     Temp 11/21/16 0636 98.9 F (37.2 C)     Temp Source 11/21/16 0636 Oral     SpO2 11/21/16 0636 99 %     Weight 11/21/16 0635 204 lb (92.5 kg)     Height 11/21/16 0635 5\' 3"  (1.6 m)     Head Circumference --      Peak Flow --      Pain Score 11/21/16 0634 9      Pain Loc --      Pain Edu? --      Excl. in [place]? --     Constitutional: Alert and oriented. Well appearing and in no acute distress. Eyes: Conjunctivae are normal Head: Atraumatic HEENT: No congestion/rhinnorhea. Mucous membranes are moist.  Poor dentition noted with some tenderness to palpation in a decayed tooth with minimal swelling, no abscess noted in the right upper molar Neck:   Nontender with no meningismus, no masses, no stridor Cardiovascular: Normal rate, regular rhythm. Grossly normal heart sounds.  Good peripheral circulation. Respiratory: Normal respiratory effort.  No retractions. Lungs CTAB. Abdominal: Soft and tenderness to palpation in the epigastric region, with palpation of this area does reproduce her discomfort. No distention. No guarding no rebound Back:  There is no focal tenderness or step off.  there is no midline tenderness there are no lesions noted. there is no CVA tenderness  Musculoskeletal: No lower extremity tenderness, no upper extremity tenderness. No joint effusions, no DVT signs strong distal pulses no edema Neurologic:  Normal speech and language. No gross focal neurologic deficits are appreciated.  Skin:  Skin is warm, dry and intact. No rash noted. Psychiatric: Mood and affect are normal. Speech and behavior are normal.  ____________________________________________   LABS (all labs ordered are listed, but only abnormal results are displayed)  Labs Reviewed  COMPREHENSIVE METABOLIC PANEL - Abnormal; Notable for the following:       Result Value   Glucose, Bld 131 (*)    All other components within normal limits  CBC WITH DIFFERENTIAL/PLATELET - Abnormal; Notable for the following:    Neutro Abs 8.1 (*)    All other components within normal limits  TROPONIN I  LIPASE, BLOOD    Pertinent labs  results that were available during my care of the patient were reviewed by me and considered in my medical decision making (see chart for  details). ____________________________________________  EKG  I personally interpreted any EKGs ordered by me or triage Sinus rhythm rate 70 bpm normal axis no acute ST elevation or depression unremarkable EKG ____________________________________________  RADIOLOGY  Pertinent labs & imaging results  that were available during my care of the patient were reviewed by me and considered in my medical decision making (see chart for details). If possible, patient and/or family made aware of any abnormal findings. ____________________________________________    PROCEDURES  Procedure(s) performed: None  Procedures  Critical Care performed: None  ____________________________________________   INITIAL IMPRESSION / ASSESSMENT AND PLAN / ED COURSE  Pertinent labs & imaging results that were available during my care of the patient were reviewed by me and considered in my medical decision making (see chart for details).  Here with minimal epigastric reproducible pain after taking antibiotics.  Very well-appearing, she does have a history of ACS we did do a troponin which is negative despite pain for 11 hours I do not think serial enzymes are required EKG also reassuring and this is very reproducible.  Patient does not have any evidence of gallbladder disease on her blood work liver function tests and lipase are reassuring, nothing to suggest pancreatitis.  We will treat her symptomatically with GI cocktail to see if this helps and will reassess.  ----------------------------------------- 8:39 AM on 11/21/2016 -----------------------------------------  Patient had complete resolution of symptoms the minute she took the GI cocktail she does not wish to stay for any further care, considering the patient's symptoms, medical history, and physical examination today, I have low suspicion for cholecystitis or biliary pathology, pancreatitis, perforation or bowel obstruction, hernia, intra-abdominal  abscess, AAA or dissection, volvulus or intussusception, mesenteric ischemia, ischemic gut, pyelonephritis or appendicitis.  At this time, there does not appear to be clinical evidence to support the diagnosis of pulmonary embolus, dissection, myocarditis, endocarditis, pericarditis, pericardial tamponade, acute coronary syndrome, pneumothorax, pneumonia, or any other acute intrathoracic pathology that will require admission or acute intervention. Nor is there evidence of any significant intra-abdominal pathology causing this discomfort.,  She is requesting a GI cocktail prescription which I think we can reproduce to some degree although not exactly.  In addition, patient states she cannot tolerate Augmentin because it is bad for her stomach.  I will changed to clindamycin to see if that can keep her compliant with her antibiotics for her dentist.  Patient understands the risk of diarrheal illness etc. with clindamycin.  Nonetheless she will stop Augmentin and take the clindamycin.  In addition, return precautions for increased pain melena vomiting fever chest pain etc. have been given and understood.    ____________________________________________   FINAL CLINICAL IMPRESSION(S) / ED DIAGNOSES  Final diagnoses:  None      This chart was dictated using voice recognition software.  Despite best efforts to proofread,  errors can occur which can change meaning.      Schuyler Amor, MD 11/21/16 5885    Schuyler Amor, MD 11/21/16 0277    Schuyler Amor, MD 11/21/16 [phone]

## 2016-11-21 NOTE — ED Notes (Signed)

## 2016-11-26 ENCOUNTER — Encounter: Payer: Self-pay | Admitting: Student in an Organized Health Care Education/Training Program

## 2016-11-26 ENCOUNTER — Other Ambulatory Visit: Payer: Self-pay | Admitting: Student in an Organized Health Care Education/Training Program

## 2016-11-26 ENCOUNTER — Telehealth: Payer: Self-pay

## 2016-11-26 MED ORDER — OXYCODONE-ACETAMINOPHEN 10-325 MG PO TABS: 1.0000 | ORAL_TABLET | Freq: Four times a day (QID) | ORAL | 0 refills | Status: DC | PRN

## 2016-11-26 NOTE — Telephone Encounter (Signed)
Patient will bring back script and get a new one.

## 2016-11-26 NOTE — Telephone Encounter (Signed)
MR date is 12-31-16. That is a Tuesday. Attempted to call patient, message left.

## 2016-12-02 IMAGING — CR DG CHEST 1V PORT
1 series · 1 of 1 positions shown · non-contrast
Comparison: None.

CLINICAL DATA: Chest pain radiating up into jaw and down into left
arm for 2 days

EXAM:
PORTABLE CHEST - 1 VIEW

[ap]
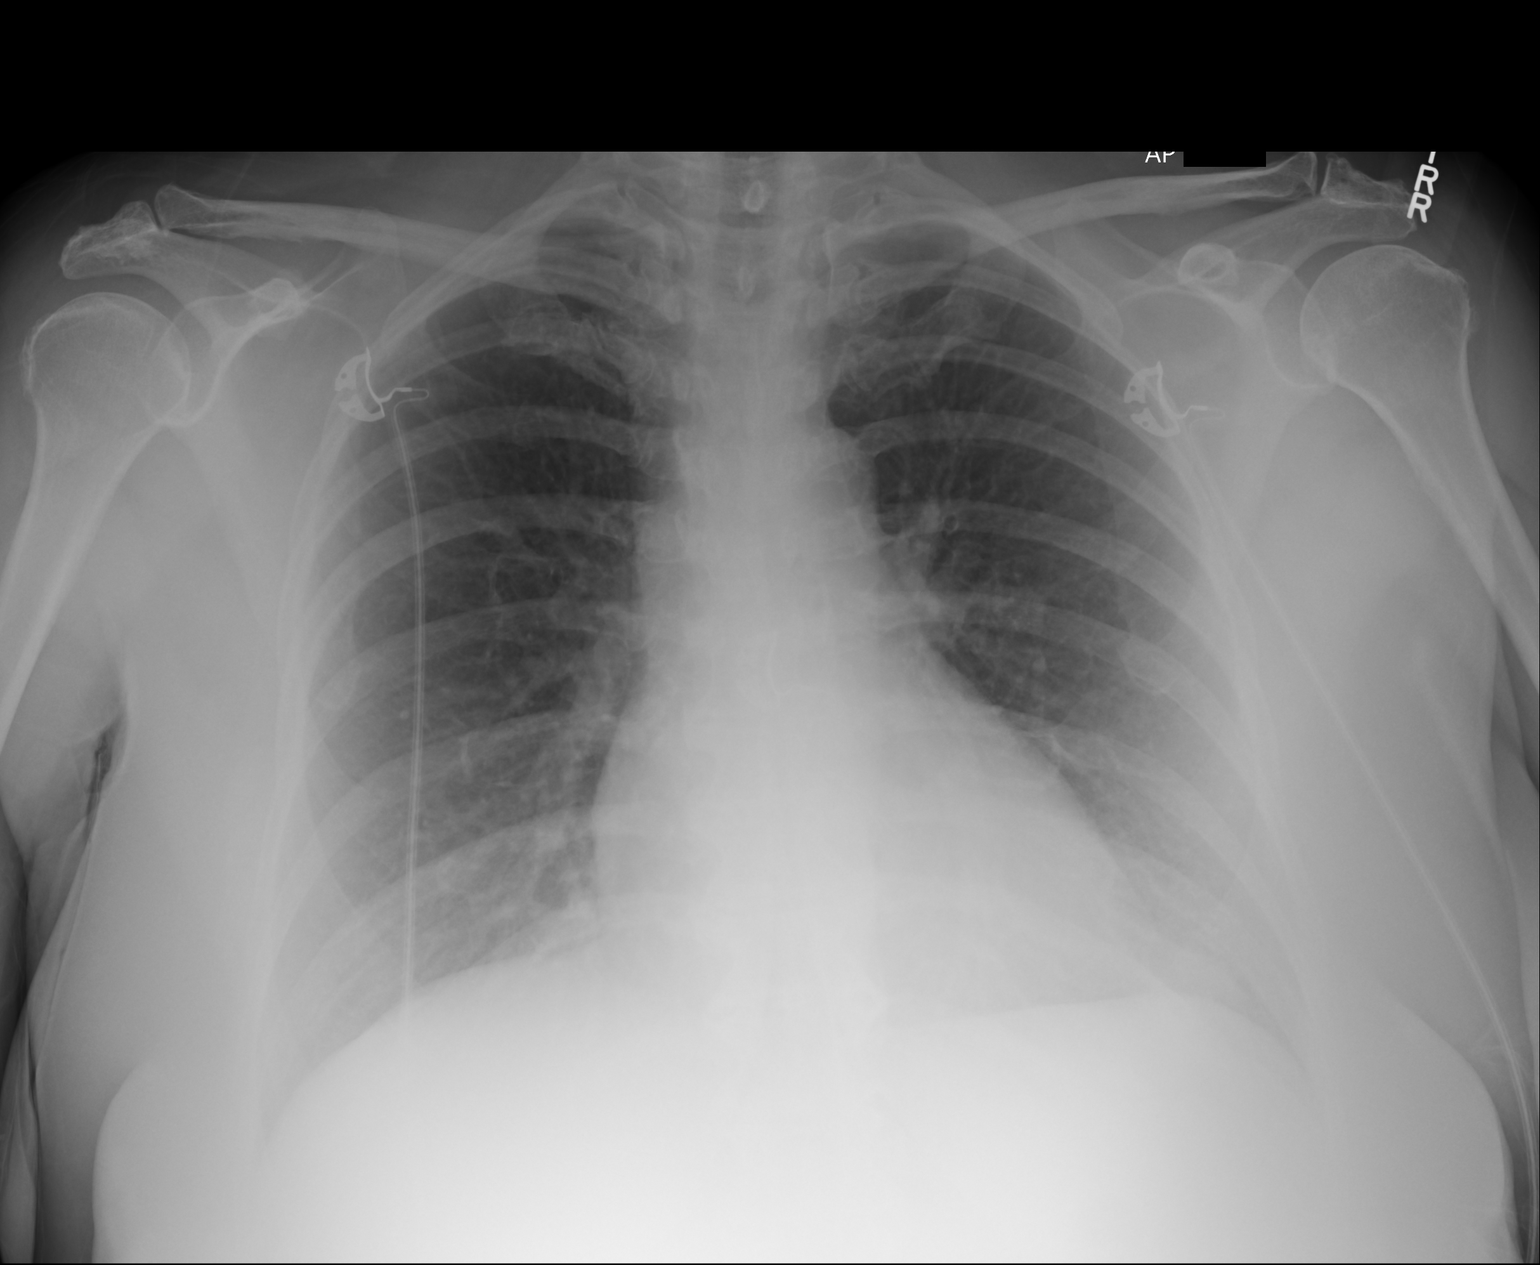

[1 of 1 positions shown; findings below may reference images not displayed]

FINDINGS: The heart size and mediastinal contours are within normal limits.
Both lungs are clear. The visualized skeletal structures are
unremarkable.
IMPRESSION: No active disease.

## 2016-12-03 ENCOUNTER — Encounter: Payer: Self-pay | Admitting: Student in an Organized Health Care Education/Training Program

## 2016-12-03 ENCOUNTER — Ambulatory Visit
Payer: Medicare Other | Attending: Student in an Organized Health Care Education/Training Program | Admitting: Student in an Organized Health Care Education/Training Program

## 2016-12-03 VITALS — BP 130/49 | HR 63 | Temp 98.4°F | Resp 18 | Ht 62.0 in

## 2016-12-03 DIAGNOSIS — I2510 Atherosclerotic heart disease of native coronary artery without angina pectoris: Secondary | ICD-10-CM | POA: Diagnosis not present

## 2016-12-03 DIAGNOSIS — M47816 Spondylosis without myelopathy or radiculopathy, lumbar region: Secondary | ICD-10-CM | POA: Diagnosis not present

## 2016-12-03 DIAGNOSIS — Z87891 Personal history of nicotine dependence: Secondary | ICD-10-CM | POA: Insufficient documentation

## 2016-12-03 DIAGNOSIS — E669 Obesity, unspecified: Secondary | ICD-10-CM | POA: Insufficient documentation

## 2016-12-03 DIAGNOSIS — Z8719 Personal history of other diseases of the digestive system: Secondary | ICD-10-CM | POA: Diagnosis not present

## 2016-12-03 DIAGNOSIS — Z8249 Family history of ischemic heart disease and other diseases of the circulatory system: Secondary | ICD-10-CM | POA: Insufficient documentation

## 2016-12-03 DIAGNOSIS — M549 Dorsalgia, unspecified: Secondary | ICD-10-CM | POA: Insufficient documentation

## 2016-12-03 DIAGNOSIS — I10 Essential (primary) hypertension: Secondary | ICD-10-CM | POA: Diagnosis not present

## 2016-12-03 DIAGNOSIS — M5126 Other intervertebral disc displacement, lumbar region: Secondary | ICD-10-CM | POA: Insufficient documentation

## 2016-12-03 DIAGNOSIS — I252 Old myocardial infarction: Secondary | ICD-10-CM | POA: Diagnosis not present

## 2016-12-03 DIAGNOSIS — Z79899 Other long term (current) drug therapy: Secondary | ICD-10-CM | POA: Diagnosis not present

## 2016-12-03 DIAGNOSIS — M069 Rheumatoid arthritis, unspecified: Secondary | ICD-10-CM | POA: Diagnosis not present

## 2016-12-03 DIAGNOSIS — I214 Non-ST elevation (NSTEMI) myocardial infarction: Secondary | ICD-10-CM | POA: Insufficient documentation

## 2016-12-03 DIAGNOSIS — M2550 Pain in unspecified joint: Secondary | ICD-10-CM | POA: Diagnosis not present

## 2016-12-03 DIAGNOSIS — Z955 Presence of coronary angioplasty implant and graft: Secondary | ICD-10-CM | POA: Diagnosis not present

## 2016-12-03 DIAGNOSIS — Z888 Allergy status to other drugs, medicaments and biological substances status: Secondary | ICD-10-CM | POA: Diagnosis not present

## 2016-12-03 DIAGNOSIS — M5124 Other intervertebral disc displacement, thoracic region: Secondary | ICD-10-CM | POA: Diagnosis not present

## 2016-12-03 DIAGNOSIS — E785 Hyperlipidemia, unspecified: Secondary | ICD-10-CM | POA: Diagnosis not present

## 2016-12-03 DIAGNOSIS — G894 Chronic pain syndrome: Secondary | ICD-10-CM | POA: Insufficient documentation

## 2016-12-03 DIAGNOSIS — M4316 Spondylolisthesis, lumbar region: Secondary | ICD-10-CM | POA: Diagnosis not present

## 2016-12-03 DIAGNOSIS — M7918 Myalgia, other site: Secondary | ICD-10-CM | POA: Insufficient documentation

## 2016-12-03 DIAGNOSIS — M059 Rheumatoid arthritis with rheumatoid factor, unspecified: Secondary | ICD-10-CM | POA: Insufficient documentation

## 2016-12-03 DIAGNOSIS — M48061 Spinal stenosis, lumbar region without neurogenic claudication: Secondary | ICD-10-CM | POA: Diagnosis not present

## 2016-12-03 DIAGNOSIS — Z7982 Long term (current) use of aspirin: Secondary | ICD-10-CM | POA: Insufficient documentation

## 2016-12-03 MED ORDER — OXYCODONE-ACETAMINOPHEN 10-325 MG PO TABS: 1.0000 | ORAL_TABLET | Freq: Four times a day (QID) | ORAL | 0 refills | Status: DC | PRN

## 2016-12-03 NOTE — Progress Notes (Signed)
Nursing Pain Medication Assessment:  Safety precautions to be maintained throughout the outpatient stay will include: orient to surroundings, keep bed in low position, maintain call bell within reach at all times, provide assistance with transfer out of bed and ambulation.  Medication Inspection Compliance: Pill count conducted under aseptic conditions, in front of the patient. Neither the pills nor the bottle was removed from the patient's sight at any time. Once count was completed pills were immediately returned to the patient in their original bottle.  Medication: Oxycodone/APAP Pill/Patch Count: 104.5 of 120 pills remain Pill/Patch Appearance: Markings consistent with prescribed medication Bottle Appearance: Standard pharmacy container. Clearly labeled. Filled Date: 11/ 02 / 2018 Last Medication intake:  Today

## 2016-12-03 NOTE — Patient Instructions (Signed)
You have been given 2 scripts for Percocet today.

## 2016-12-03 NOTE — Progress Notes (Signed)
Patient's Name: Karen Cervantes  MRN: [id number]  Referring Provider: Denton Lank, MD  DOB: [birth year]  PCP: Denton Lank, MD  DOS: 12/03/2016  Note by: Gillis Santa, MD  Service setting: Ambulatory outpatient  Specialty: Interventional Pain Management  Location: [facility] (AMB) Pain Management Facility    Patient type: Established   Primary Reason(s) for Visit: Encounter for prescription drug management. (Level of risk: moderate)  CC: Back Pain (low)  HPI  Karen Cervantes is a 61 y.o. year old, female patient, who comes today for a medication management evaluation. She has NSTEMI (non-ST elevated myocardial infarction) ([place]); CAD (coronary artery disease); HLD (hyperlipidemia); Obesity; RA (rheumatoid arthritis) ([place]); Diastolic dysfunction; Essential hypertension; Ischemic chest pain; and S/P coronary artery stent placement on their problem list. Her primarily concern today is the Back Pain (low)  Pain Assessment: Location:    Lower back Radiating:  Radiation to bilateral legs Onset:  Chronic pain greater than 3 months Duration:  Chronic pain Quality:  Aching, shooting, stabbing, throbbing Severity: 7 /10 (self-reported pain score)  Note: Reported level is inconsistent with clinical observations. Clinically the patient looks like a 3/10 A 4/10 is viewed as "Moderately Severe" and described as impossible to ignore for more than a few minutes. With effort, patients may still be able to manage work or participate in some social activities. Very difficult to concentrate. Signs of autonomic nervous system discharge are evident: dilated pupils (mydriasis); mild sweating (diaphoresis); sleep interference. Heart rate becomes elevated (>115 bpm). Diastolic blood pressure (lower number) rises above 100 mmHg. Patients find relief in laying down and not moving.       When using our objective Pain Scale, levels between 6 and 10/10 are said to belong in an emergency room, as it progressively worsens from a 6/10,  described as severely limiting, requiring emergency care not usually available at an outpatient pain management facility. At a 6/10 level, communication becomes difficult and requires great effort. Assistance to reach the emergency department may be required. Facial flushing and profuse sweating along with potentially dangerous increases in heart rate and blood pressure will be evident. Effect on ADL:  Trouble accomplishing ADLs but medications help makes patient more functional Timing:  Constant Modifying factors:  Medications, rest, heat  Karen Cervantes was last scheduled for an appointment on 10/22/2016 for medication management. During today's appointment we reviewed Karen Cervantes's chronic pain status, as well as her outpatient medication regimen.  The patient  reports that she does not use drugs. Her body mass index is 37.31 kg/m.  Patient returns for follow-up.  Has had dental procedure done.  Otherwise pain at baseline.  Presents for medication refill.  Further details on both, my assessment(s), as well as the proposed treatment plan, please see below.  Controlled Substance Pharmacotherapy Assessment REMS (Risk Evaluation and Mitigation Strategy)  Analgesic: Percocet, 10 mg, one tablet every 6 hours as needed MME/day: 55-50m/day.  TDewayne Shorter RN  12/03/2016  9:00 AM  Signed Nursing Pain Medication Assessment:  Safety precautions to be maintained throughout the outpatient stay will include: orient to surroundings, keep bed in low position, maintain call bell within reach at all times, provide assistance with transfer out of bed and ambulation.  Medication Inspection Compliance: Pill count conducted under aseptic conditions, in front of the patient. Neither the pills nor the bottle was removed from the patient's sight at any time. Once count was completed pills were immediately returned to the patient in their original bottle.  Medication: Oxycodone/APAP Pill/Patch Count:  104.5 of 120 pills  remain Pill/Patch Appearance: Markings consistent with prescribed medication Bottle Appearance: Standard pharmacy container. Clearly labeled. Filled Date: 11/ 02 / 2018 Last Medication intake:  Today   Pharmacokinetics: Liberation and absorption (onset of action): WNL Distribution (time to peak effect): WNL Metabolism and excretion (duration of action): WNL         Pharmacodynamics: Desired effects: Analgesia: Karen Cervantes reports >50% benefit. Functional ability: Patient reports that medication allows her to accomplish basic ADLs Clinically meaningful improvement in function (CMIF): Sustained CMIF goals met Perceived effectiveness: Described as relatively effective, allowing for increase in activities of daily living (ADL) Undesirable effects: Side-effects or Adverse reactions: None reported Monitoring: [place] PMP: Online review of the past 72-monthperiod conducted. Compliant with practice rules and regulations Last UDS on record: Summary  Date Value Ref Range Status  09/16/2016 FINAL  Final    Comment:    ==================================================================== TOXASSURE COMP DRUG ANALYSIS,UR ==================================================================== Test                             Result       Flag       Units Drug Present and Declared for Prescription Verification   Gabapentin                     PRESENT      EXPECTED Drug Present not Declared for Prescription Verification   Oxazepam                       125          UNEXPECTED ng/mg creat    Oxazepam may be administered as a scheduled prescription    medication; it is also an expected metabolite of other    benzodiazepine drugs, including diazepam, chlordiazepoxide,    prazepam, clorazepate, halazepam, and temazepam. Drug Absent but Declared for Prescription Verification   Oxycodone                      Not Detected UNEXPECTED ng/mg creat   Buprenorphine                  Not Detected UNEXPECTED ng/mg  creat   Cyclobenzaprine                Not Detected UNEXPECTED   Tizanidine                     Not Detected UNEXPECTED    Tizanidine, as indicated in the declared medication list, is not    always detected even when used as directed.   Metaxalone                     Not Detected UNEXPECTED   Duloxetine                     Not Detected UNEXPECTED   Acetaminophen                  Not Detected UNEXPECTED    Acetaminophen, as indicated in the declared medication list, is    not always detected even when used as directed.   Salicylate                     Not Detected UNEXPECTED    Aspirin, as indicated in the declared medication list, is not    always  detected even when used as directed.   Lidocaine                      Not Detected UNEXPECTED    Lidocaine, as indicated in the declared medication list, is not    always detected even when used as directed. ==================================================================== Test                      Result    Flag   Units      Ref Range   Creatinine              24               mg/dL      >=20 ==================================================================== Declared Medications:  The flagging and interpretation on this report are based on the  following declared medications.  Unexpected results may arise from  inaccuracies in the declared medications.  **Note: The testing scope of this panel includes these medications:  Buprenorphine  Cyclobenzaprine (Flexeril)  Duloxetine (Cymbalta)  Gabapentin (Neurontin)  Metaxalone  Oxycodone (Percocet)  **Note: The testing scope of this panel does not include small to  moderate amounts of these reported medications:  Acetaminophen (Percocet)  Aspirin  Lidocaine (Lidoderm)  Tizanidine (Zanaflex)  **Note: The testing scope of this panel does not include following  reported medications:  Atorvastatin  Carvedilol  Clopidogrel  Ezetimibe (Zetia)  Fenofibrate (Tricor)  Folic acid   Lisinopril  Melatonin  Methotrexate  Multivitamin  Nitroglycerin  Omeprazole  Pantoprazole (Protonix)  Sucralfate (Carafate) ==================================================================== For clinical consultation, please call [phone]. ====================================================================    UDS interpretation: Compliant          Medication Assessment Form: Reviewed. Patient indicates being compliant with therapy Treatment compliance: Compliant Risk Assessment Profile: Aberrant behavior: See prior evaluations. None observed or detected today Comorbid factors increasing risk of overdose: See prior notes. No additional risks detected today Risk of substance use disorder (SUD): Low Opioid Risk Tool - 12/03/16 0856      Family History of Substance Abuse   Alcohol  Negative    Illegal Drugs  Negative    Rx Drugs  Negative      Personal History of Substance Abuse   Alcohol  Negative    Illegal Drugs  Negative    Rx Drugs  Negative      Age   Age between 20-45 years   No      History of Preadolescent Sexual Abuse   History of Preadolescent Sexual Abuse  Negative or Female      Psychological Disease   Psychological Disease  Negative    Depression  Negative      Total Score   Opioid Risk Tool Scoring  0    Opioid Risk Interpretation  Low Risk      ORT Scoring interpretation table:  Score <3 = Low Risk for SUD  Score between 4-7 = Moderate Risk for SUD  Score >8 = High Risk for Opioid Abuse   Risk Mitigation Strategies:  Patient Counseling: Covered Patient-Prescriber Agreement (PPA): Present and active  Notification to other healthcare providers: Done  Pharmacologic Plan: No change in therapy, at this time  Laboratory Chemistry  Inflammation Markers (CRP: Acute Phase) (ESR: Chronic Phase) No results found for: CRP, ESRSEDRATE               Renal Function Markers Lab Results  Component Value Date   BUN 7 11/21/2016   CREATININE  0.64  11/21/2016   GFRAA >60 11/21/2016   GFRNONAA >60 11/21/2016                 Hepatic Function Markers Lab Results  Component Value Date   AST 22 11/21/2016   ALT 18 11/21/2016   ALBUMIN 4.2 11/21/2016   ALKPHOS 78 11/21/2016                 Electrolytes Lab Results  Component Value Date   NA 141 11/21/2016   K 3.7 11/21/2016   CL 105 11/21/2016   CALCIUM 9.4 11/21/2016                 Neuropathy Markers No results found for: [id number]               Bone Pathology Markers Lab Results  Component Value Date   ALKPHOS 78 11/21/2016   CALCIUM 9.4 11/21/2016                 Coagulation Parameters Lab Results  Component Value Date   INR 1.02 01/02/2015   LABPROT 13.6 01/02/2015   APTT 35 01/02/2015   PLT 349 11/21/2016                 Cardiovascular Markers Lab Results  Component Value Date   HGB 12.4 11/21/2016   HCT 36.0 11/21/2016                 Note: Lab results reviewed.  Recent Diagnostic Imaging Results  MR Lumbar Spine Wo Contrast CLINICAL DATA:  61 year old female with lumbar back pain radiating to the left leg and foot with burning numbness and tingling. Pain with walking and some weakness for 5 weeks. Initial encounter.  EXAM: MRI LUMBAR SPINE WITHOUT CONTRAST  TECHNIQUE: Multiplanar, multisequence MR imaging of the lumbar spine was performed. No intravenous contrast was administered.  COMPARISON:  None.  FINDINGS: Lumbar segmentation appears to be normal and will be designated as such for this report. Very mild levoconvex scoliosis. Straightening of lumbar lordosis. Mild grade 1 anterolisthesis at L3 L for. No marrow edema or evidence of acute osseous abnormality.  No signal abnormality in the visualized lower thoracic spinal cord, abnormal mass effect on the cord at T11- T12 is described below. Conus medullaris appears normal at L1.  Visualized abdominal viscera and paraspinal soft tissues are within normal limits.  T11-T12: Disc  space loss, disc desiccation, and moderate size left paracentral disc extrusion (series 2, image 6) superimposed on circumferential disc bulge. Mild facet hypertrophy. Moderate spinal stenosis with mass effect on the ventral cord. No definite cord signal abnormality. No T11 foraminal involvement.  T12-L1: Minor disc desiccation and circumferential disc bulge. No stenosis.  L1-L2:  Mild mostly anterior disc bulging.  No stenosis.  L2-L3: Disc space loss with moderate circumferential disc bulge. Broad-based posterior component of disc with moderate facet and ligament flavum hypertrophy. Mild epidural lipomatosis. Moderate spinal stenosis. Borderline to mild left L2 foraminal stenosis.  L3-L4: Grade 1 anterolisthesis. Disc desiccation. Circumferential disc bulge with broad-based posterior component. Severe facet hypertrophy were thus on the right. Right side facet joint fluid. Severe spinal and right greater than left lateral recess stenosis. Mild right greater than left L3 foraminal stenosis.  There is a moderate to large sequestered disc fragment in the left epidural space posterior to the L4 vertebral body (series 3, image 15, series 2, image 15 and series 6, image 30) encompassing 11 x 13 x 16 mm (AP by  transverse by CC). I favor this originated from the L4-L5 disc space in light of the bulky left foraminal extrusion at that level (described below). Associated severe stenosis along the descending and proximal exiting left L4 nerve.  L4-L5: Vacuum disc. Circumferential disc osteophyte complex with bulky left foraminal disc extrusion best seen on series 6, image 33. Moderate to severe facet and ligament flavum hypertrophy.  Severe left L4 foraminal stenosis. Moderate spinal and bilateral lateral recess stenosis. Mild right L4 foraminal stenosis.  L5-S1: Far lateral disc osteophyte complex. Mild to moderate facet hypertrophy. Mild epidural lipomatosis. No  stenosis.  IMPRESSION: 1. Grade 1 anterolisthesis at L3-L4 and advanced disc disease at L4-L5. Probably originating from the latter is a bulky sequestered disc fragment in the left epidural space posterior to the L4 vertebral body. Superimposed bulky left foraminal disc extrusion at L4-L5. Superimposed bulky facet degeneration at L3-L4. 2. Subsequent severe spinal and lateral recess stenosis at L3-L4, severe stenosis along the course of the descending and exiting left L4 nerve, and moderate spinal and bilateral lateral recess stenosis at L4-L5. 3. Fairly advanced disc and posterior element degeneration at L2-L3 resulting in moderate spinal stenosis. 4. Bulky lower thoracic disc herniation at T11-T12 resulting in spinal stenosis with moderate mass effect on the lower thoracic spinal cord, no definite cord signal abnormality.  Electronically Signed   By: Genevie Ann M.D.   On: 05/11/2015 15:14  Complexity Note: Imaging results reviewed. Results shared with Karen Cervantes, using Layman's terms.                         Meds   Current Outpatient Medications:  .  alum & mag hydroxide-simeth (MAALOX MAX) 400-400-40 MG/5ML suspension, Take 5 mLs by mouth every 6 (six) hours as needed for indigestion., Disp: 355 mL, Rfl: 0 .  aspirin EC 81 MG tablet, Take 1 tablet (81 mg total) by mouth daily., Disp: 30 tablet, Rfl: 0 .  atorvastatin (LIPITOR) 80 MG tablet, Take 80 mg by mouth every evening., Disp: , Rfl:  .  carvedilol (COREG) 12.5 MG tablet, Take 1 tablet (12.5 mg total) by mouth 2 (two) times daily with a meal., Disp: 30 tablet, Rfl: 0 .  clindamycin (CLEOCIN) 150 MG capsule, Take 150 mg 3 (three) times daily by mouth., Disp: , Rfl:  .  clopidogrel (PLAVIX) 75 MG tablet, Take 1 tablet (75 mg total) by mouth daily., Disp: 30 tablet, Rfl: 0 .  ezetimibe (ZETIA) 10 MG tablet, Take 10 mg by mouth daily., Disp: , Rfl:  .  FOLIC ACID PO, Take 1 tablet by mouth every morning. , Disp: , Rfl:  .   gabapentin (NEURONTIN) 300 MG capsule, Take 600 mg by mouth 2 (two) times daily. , Disp: , Rfl:  .  lidocaine (XYLOCAINE) 2 % solution, Use as directed 20 mLs in the mouth or throat as needed for mouth pain., Disp: 100 mL, Rfl: 0 .  lisinopril (PRINIVIL,ZESTRIL) 10 MG tablet, Take 1 tablet (10 mg total) by mouth daily., Disp: 30 tablet, Rfl: 0 .  Melatonin 5 MG CAPS, Take 10-30 mg by mouth at bedtime. , Disp: , Rfl:  .  Multiple Vitamins-Minerals (MULTIVITAMIN PO), Take 1 tablet by mouth every morning., Disp: , Rfl:  .  nitroGLYCERIN (NITROSTAT) 0.4 MG SL tablet, Place 1 tablet (0.4 mg total) under the tongue every 5 (five) minutes as needed for chest pain., Disp: 25 tablet, Rfl: 3 .  omeprazole (PRILOSEC) 20 MG capsule,  Take 20 mg by mouth 2 (two) times daily before a meal., Disp: , Rfl:  .  oxyCODONE-acetaminophen (PERCOCET) 10-325 MG tablet, Take 1 tablet every 6 (six) hours as needed by mouth for pain., Disp: 120 tablet, Rfl: 0 .  riTUXimab (RITUXAN) 100 MG/10ML injection, Inject into the vein., Disp: , Rfl:  .  sucralfate (CARAFATE) 1 G tablet, Take 1 tablet (1 g total) by mouth 4 (four) times daily -  before meals and at bedtime., Disp: 60 tablet, Rfl: 0 .  tiZANidine (ZANAFLEX) 4 MG capsule, Take 4 mg by mouth at bedtime. , Disp: , Rfl:  .  acetaminophen-codeine (TYLENOL #3) 300-30 MG tablet, Take 2 tablets by mouth 3 (three) times daily., Disp: , Rfl:  .  fenofibrate (TRICOR) 48 MG tablet, Take 1 tablet (48 mg total) by mouth daily. (Patient not taking: Reported on 11/21/2016), Disp: 30 tablet, Rfl: 6 .  pantoprazole (PROTONIX) 40 MG tablet, Take 1 tablet (40 mg total) by mouth daily. (Patient not taking: Reported on 10/22/2016), Disp: 30 tablet, Rfl: 6 .  sucralfate (CARAFATE) 1 g tablet, Take 1 tablet (1 g total) by mouth 4 (four) times daily. (Patient not taking: Reported on 10/22/2016), Disp: 120 tablet, Rfl: 1  ROS  Constitutional: Denies any fever or chills Gastrointestinal: No reported  hemesis, hematochezia, vomiting, or acute GI distress Musculoskeletal: Denies any acute onset joint swelling, redness, loss of ROM, or weakness Neurological: No reported episodes of acute onset apraxia, aphasia, dysarthria, agnosia, amnesia, paralysis, loss of coordination, or loss of consciousness  Allergies  Karen Cervantes is allergic to remicade [infliximab].  [place]  Drug: Karen Cervantes  reports that she does not use drugs. Alcohol:  reports that she does not drink alcohol. Tobacco:  reports that she has quit smoking. she has never used smokeless tobacco. Medical:  has a past medical history of CAD (coronary artery disease), Diastolic dysfunction, Gastric ulcer, HLD (hyperlipidemia), Hypertension, Myocardial infarction [facility]) (may 2016, December 2016), Obesity, and RA (rheumatoid arthritis) ([place]). Surgical: Karen Cervantes  has a past surgical history that includes Left Heart Cath and Coronary Angiography (N/A, 01/02/2015); Coronary Stent Intervention (N/A, 01/02/2015); Left Heart Cath and Coronary Angiography (N/A, 05/30/2014); and Coronary Stent Intervention (N/A, 05/30/2014). Family: family history includes Heart Problems in her brother, brother, and brother; Heart attack in her father; Heart disease in her father.  Constitutional Exam  General appearance: Well nourished, well developed, and well hydrated. In no apparent acute distress Vitals:   12/03/16 0850  BP: (!) 130/49  Pulse: 63  Resp: 18  Temp: 98.4 F (36.9 C)  SpO2: 96%  Height: _0  (1.575 m)   BMI Assessment: Estimated body mass index is 37.31 kg/m as calculated from the following:   Height as of this encounter: _1  (1.575 m).   Weight as of 11/21/16: 204 lb (92.5 kg).  BMI interpretation table: BMI level Category Range association with higher incidence of chronic pain  <18 kg/m2 Underweight   18.5-24.9 kg/m2 Ideal body weight   25-29.9 kg/m2 Overweight Increased incidence by 20%  30-34.9 kg/m2 Obese (Class I) Increased  incidence by 68%  35-39.9 kg/m2 Severe obesity (Class II) Increased incidence by 136%  >40 kg/m2 Extreme obesity (Class III) Increased incidence by 254%   BMI Readings from Last 4 Encounters:  12/03/16 37.31 kg/m  11/21/16 36.14 kg/m  10/22/16 37.38 kg/m  10/02/16 37.02 kg/m   Wt Readings from Last 4 Encounters:  11/21/16 204 lb (92.5 kg)  10/22/16 211 lb (95.7 kg)  10/02/16 209 lb (94.8 kg)  09/18/16 204 lb (92.5 kg)  Psych/Mental status: Alert, oriented x 3 (person, place, & time)       Eyes: PERLA Respiratory: No evidence of acute respiratory distress  Cervical Spine Area Exam  Skin & Axial Inspection: No masses, redness, edema, swelling, or associated skin lesions Alignment: Symmetrical Functional ROM: Unrestricted ROM      Stability: No instability detected Muscle Tone/Strength: Functionally intact. No obvious neuro-muscular anomalies detected. Sensory (Neurological): Unimpaired Palpation: No palpable anomalies              Upper Extremity (UE) Exam    Side: Right upper extremity  Side: Left upper extremity  Skin & Extremity Inspection: Skin color, temperature, and hair growth are WNL. No peripheral edema or cyanosis. No masses, redness, swelling, asymmetry, or associated skin lesions. No contractures.  Skin & Extremity Inspection: Skin color, temperature, and hair growth are WNL. No peripheral edema or cyanosis. No masses, redness, swelling, asymmetry, or associated skin lesions. No contractures.  Functional ROM: Unrestricted ROM          Functional ROM: Unrestricted ROM          Muscle Tone/Strength: Functionally intact. No obvious neuro-muscular anomalies detected.  Muscle Tone/Strength: Functionally intact. No obvious neuro-muscular anomalies detected.  Sensory (Neurological): Unimpaired          Sensory (Neurological): Unimpaired          Palpation: No palpable anomalies              Palpation: No palpable anomalies              Specialized Test(s): Deferred          Specialized Test(s): Deferred          Thoracic Spine Area Exam  Skin & Axial Inspection: No masses, redness, or swelling Alignment: Symmetrical Functional ROM: Unrestricted ROM Stability: No instability detected Muscle Tone/Strength: Functionally intact. No obvious neuro-muscular anomalies detected. Sensory (Neurological): Unimpaired Muscle strength & Tone: No palpable anomalies  Lumbar Spine Area Exam  Skin & Axial Inspection: No masses, redness, or swelling Alignment: Symmetrical Functional ROM: Unrestricted ROM      Stability: No instability detected Muscle Tone/Strength: Functionally intact. No obvious neuro-muscular anomalies detected. Sensory (Neurological): Unimpaired Palpation: No palpable anomalies       Provocative Tests: Lumbar Hyperextension and rotation test: Positive bilaterally for facet joint pain. Lumbar Lateral bending test: evaluation deferred today       Patrick's Maneuver: evaluation deferred today                    Gait & Posture Assessment  Ambulation: Unassisted Gait: Relatively normal for age and body habitus Posture: WNL   Lower Extremity Exam    Side: Right lower extremity  Side: Left lower extremity  Skin & Extremity Inspection: Skin color, temperature, and hair growth are WNL. No peripheral edema or cyanosis. No masses, redness, swelling, asymmetry, or associated skin lesions. No contractures.  Skin & Extremity Inspection: Skin color, temperature, and hair growth are WNL. No peripheral edema or cyanosis. No masses, redness, swelling, asymmetry, or associated skin lesions. No contractures.  Functional ROM: Unrestricted ROM          Functional ROM: Unrestricted ROM          Muscle Tone/Strength: Functionally intact. No obvious neuro-muscular anomalies detected.  Muscle Tone/Strength: Functionally intact. No obvious neuro-muscular anomalies detected.  Sensory (Neurological): Unimpaired  Sensory (Neurological): Unimpaired  Palpation: No palpable anomalies  Palpation: No palpable anomalies   Assessment  Primary Diagnosis & Pertinent Problem List: The primary encounter diagnosis was Rheumatoid arthritis involving multiple sites, unspecified rheumatoid factor presence ([place]). Diagnoses of Lumbar spondylosis, Myofascial pain, Chronic pain syndrome, and Arthralgia, unspecified joint were also pertinent to this visit.  Status Diagnosis  Controlled Controlled Controlled 1. Rheumatoid arthritis involving multiple sites, unspecified rheumatoid factor presence ([place])   2. Lumbar spondylosis   3. Myofascial pain   4. Chronic pain syndrome   5. Arthralgia, unspecified joint      61 year old female with a past medical history of seropositive rheumatoid arthritis diagnosed in 1996 was been on chronic opioid therapy since around that time. Patient was previously seen by [facility] rheumatology however was having difficulty contacting and arranging follow-up with them. They were primarily prescribing her opioid medications. They tried to wean her Percocet 10 mg from 6 times a day to 5 times a day but the patient was unable to tolerate it. She did experience opioid withdrawal and had to go to the emergency department. Patient did follow up with the [facility] pain management who prescribed her a Butrans patch 10 g. Patient states that this helped for the first couple days but is not very effective. Collateral obtained from the patient's family members also indicates that patient has worse pain, decreased functionality, depressed mood as result of not being on her chronic oral opioid therapy. Has seen pain psychology and was deemed low risk for substance abuse potential.  Presents today doing relatively well, no significant changes in her past medical history. Patient states that medications help manage her pain, help her perform her activities of daily living including taking care of her daughter and granddaughter.  Plan: -Refill provided, Percocet 10 mg 1 tablet every 6  hours as needed for severe pain, quantity 120 month. Prescription provided for 2 months. Not to exceed this dose. -Continue all other medications as prescribed including gabapentin 900 mg 3 times a day, tizanidine 4 mg 3 times a day when necessary -Discussion of interventional procedures including genicular nerve blocks after patient is off of Plavix -follow-up in 8 wks   Plan of Care  Pharmacotherapy (Medications Ordered): Meds ordered this encounter  Medications  . DISCONTD: oxyCODONE-acetaminophen (PERCOCET) 10-325 MG tablet    Sig: Take 1 tablet every 6 (six) hours as needed by mouth for pain.    Dispense:  120 tablet    Refill:  0    For Chronic Pain DNF:  12/27/16, 01/24/17 Okay to fill early by 1 day if  weekend or pharmacy closed on refill date  . oxyCODONE-acetaminophen (PERCOCET) 10-325 MG tablet    Sig: Take 1 tablet every 6 (six) hours as needed by mouth for pain.    Dispense:  120 tablet    Refill:  0    For Chronic Pain DNF:  12/27/16, 01/24/17 Okay to fill early by 1 day if  weekend or pharmacy closed on refill date   Pharmacological options: Membrane stabilizer:To be determined at a later timecurrently on gabapentin, can consider Lyrica or Topamax in the future   Muscle relaxant:has tried Skelaxin, Flexeril, tizanidine. Can consider baclofen.  NSAID:Medically contraindicated,   Other analgesic(s):To be determined at a later time, Trial of Cymbalta at first visit. Can also consider TCAs.     Interventional management options: recentMI and is on dual antiplatelet therapy. She can be evaluated for interventions once she is off her blood thinners. Considerations below:   Considering:  -Genicular nerve blocks bilaterally  -  Lumbar medial branch block with goal radiofrequency ablation -Intra-articular knee steroid injections. -SI joint injections bilaterally     Provider-requested follow-up: Return in about 8 weeks (around 01/28/2017) for Medication  Management.  Future Appointments  Date Time Provider [place]  01/23/2017  8:30 AM Gillis Santa, MD [facility] None    Primary Care Physician: Denton Lank, MD Location: [facility] Note by: Gillis Santa, M.D Date: 12/03/2016; Time: 10:19 AM  Patient Instructions  You have been given 2 scripts for Percocet today.

## 2017-01-23 ENCOUNTER — Other Ambulatory Visit: Payer: Self-pay

## 2017-01-23 ENCOUNTER — Ambulatory Visit
Payer: Medicare Other | Attending: Student in an Organized Health Care Education/Training Program | Admitting: Student in an Organized Health Care Education/Training Program

## 2017-01-23 ENCOUNTER — Encounter: Payer: Self-pay | Admitting: Student in an Organized Health Care Education/Training Program

## 2017-01-23 VITALS — BP 134/75 | HR 78 | Temp 97.9°F | Ht 63.0 in | Wt 197.0 lb

## 2017-01-23 DIAGNOSIS — R202 Paresthesia of skin: Secondary | ICD-10-CM | POA: Insufficient documentation

## 2017-01-23 DIAGNOSIS — M47816 Spondylosis without myelopathy or radiculopathy, lumbar region: Secondary | ICD-10-CM | POA: Diagnosis not present

## 2017-01-23 DIAGNOSIS — M48061 Spinal stenosis, lumbar region without neurogenic claudication: Secondary | ICD-10-CM | POA: Diagnosis not present

## 2017-01-23 DIAGNOSIS — Z955 Presence of coronary angioplasty implant and graft: Secondary | ICD-10-CM | POA: Diagnosis not present

## 2017-01-23 DIAGNOSIS — E669 Obesity, unspecified: Secondary | ICD-10-CM | POA: Diagnosis not present

## 2017-01-23 DIAGNOSIS — G894 Chronic pain syndrome: Secondary | ICD-10-CM | POA: Diagnosis not present

## 2017-01-23 DIAGNOSIS — M79605 Pain in left leg: Secondary | ICD-10-CM | POA: Insufficient documentation

## 2017-01-23 DIAGNOSIS — R200 Anesthesia of skin: Secondary | ICD-10-CM | POA: Insufficient documentation

## 2017-01-23 DIAGNOSIS — M545 Low back pain: Secondary | ICD-10-CM | POA: Insufficient documentation

## 2017-01-23 DIAGNOSIS — R531 Weakness: Secondary | ICD-10-CM | POA: Diagnosis not present

## 2017-01-23 DIAGNOSIS — M5124 Other intervertebral disc displacement, thoracic region: Secondary | ICD-10-CM | POA: Insufficient documentation

## 2017-01-23 DIAGNOSIS — I214 Non-ST elevation (NSTEMI) myocardial infarction: Secondary | ICD-10-CM | POA: Insufficient documentation

## 2017-01-23 DIAGNOSIS — M069 Rheumatoid arthritis, unspecified: Secondary | ICD-10-CM

## 2017-01-23 DIAGNOSIS — M5126 Other intervertebral disc displacement, lumbar region: Secondary | ICD-10-CM | POA: Diagnosis not present

## 2017-01-23 DIAGNOSIS — M059 Rheumatoid arthritis with rheumatoid factor, unspecified: Secondary | ICD-10-CM | POA: Diagnosis not present

## 2017-01-23 DIAGNOSIS — M4316 Spondylolisthesis, lumbar region: Secondary | ICD-10-CM | POA: Insufficient documentation

## 2017-01-23 DIAGNOSIS — I2510 Atherosclerotic heart disease of native coronary artery without angina pectoris: Secondary | ICD-10-CM | POA: Insufficient documentation

## 2017-01-23 DIAGNOSIS — E785 Hyperlipidemia, unspecified: Secondary | ICD-10-CM | POA: Insufficient documentation

## 2017-01-23 DIAGNOSIS — M2550 Pain in unspecified joint: Secondary | ICD-10-CM

## 2017-01-23 DIAGNOSIS — I10 Essential (primary) hypertension: Secondary | ICD-10-CM | POA: Insufficient documentation

## 2017-01-23 DIAGNOSIS — M7918 Myalgia, other site: Secondary | ICD-10-CM

## 2017-01-23 MED ORDER — OXYCODONE-ACETAMINOPHEN 10-325 MG PO TABS: 1.0000 | ORAL_TABLET | Freq: Four times a day (QID) | ORAL | 0 refills | Status: DC | PRN

## 2017-01-23 NOTE — Progress Notes (Signed)
Safety precautions to be maintained throughout the outpatient stay will include: orient to surroundings, keep bed in low position, maintain call bell within reach at all times, provide assistance with transfer out of bed and ambulation.  

## 2017-01-23 NOTE — Progress Notes (Signed)
Patient's Name: Karen Cervantes  MRN: [id number]  Referring Provider: Denton Lank, MD  DOB: [birth year]  PCP: Denton Lank, MD  DOS: 01/23/2017  Note by: Gillis Santa, MD  Service setting: Ambulatory outpatient  Specialty: Interventional Pain Management  Location: [facility] (AMB) Pain Management Facility    Patient type: Established   Primary Reason(s) for Visit: Encounter for prescription drug management. (Level of risk: moderate)  CC: Back Pain (lower)  HPI  Karen Cervantes is a 61 y.o. year old, female patient, who comes today for a medication management evaluation. She has NSTEMI (non-ST elevated myocardial infarction) ([place]); CAD (coronary artery disease); HLD (hyperlipidemia); Obesity; RA (rheumatoid arthritis) ([place]); Diastolic dysfunction; Essential hypertension; Ischemic chest pain; and S/P coronary artery stent placement on their problem list. Her primarily concern today is the Back Pain (lower)  Pain Assessment: Location: Lower Back Radiating: Radiates down buttock and back of leg Onset: More than a month ago Duration: Chronic pain Quality: Aching, Discomfort, Constant Severity: 8 /10 (self-reported pain score)  Note: Reported level is inconsistent with clinical observations. Clinically the patient looks like a 2/10 A 2/10 is viewed as "Mild to Moderate" and described as noticeable and distracting. Impossible to hide from other people. More frequent flare-ups. Still possible to adapt and function close to normal. It can be very annoying and may have occasional stronger flare-ups. With discipline, patients may get used to it and adapt.       When using our objective Pain Scale, levels between 6 and 10/10 are said to belong in an emergency room, as it progressively worsens from a 6/10, described as severely limiting, requiring emergency care not usually available at an outpatient pain management facility. At a 6/10 level, communication becomes difficult and requires great effort. Assistance to reach  the emergency department may be required. Facial flushing and profuse sweating along with potentially dangerous increases in heart rate and blood pressure will be evident. Effect on ADL:   Timing: Constant Modifying factors: heating pad, chair  Karen Cervantes was last scheduled for an appointment on 12/03/2016 for medication management. During today's appointment we reviewed Karen Cervantes's chronic pain status, as well as her outpatient medication regimen.  Patient returns today for follow-up.  Is endorsing muscle soreness.  She states that she has tried CBD oil which was effective for her knee pain but not her spine pain.  Otherwise she is trying to have dental work performed after the new year.  She continues on dual antiplatelet therapy for MI.  The patient  reports that she does not use drugs. Her body mass index is 34.9 kg/m.  Further details on both, my assessment(s), as well as the proposed treatment plan, please see below.  Controlled Substance Pharmacotherapy Assessment REMS (Risk Evaluation and Mitigation Strategy)  Analgesic:Percocet, 10 mg, one tablet every 6 hours as needed MME/day:55-74m/day. Karen Fischer RN  01/23/2017  9:20 AM  Sign at close encounter Safety precautions to be maintained throughout the outpatient stay will include: orient to surroundings, keep bed in low position, maintain call bell within reach at all times, provide assistance with transfer out of bed and ambulation.    Pharmacokinetics: Liberation and absorption (onset of action): WNL Distribution (time to peak effect): WNL Metabolism and excretion (duration of action): WNL         Pharmacodynamics: Desired effects: Analgesia: Ms. CKuntzmanreports >50% benefit. Functional ability: Patient reports that medication allows her to accomplish basic ADLs Clinically meaningful improvement in function (CMIF): Sustained CMIF goals met Perceived  effectiveness: Described as relatively effective, allowing for increase  in activities of daily living (ADL) Undesirable effects: Side-effects or Adverse reactions: None reported Monitoring: [place] PMP: Online review of the past 58-monthperiod conducted. Compliant with practice rules and regulations Last UDS on record: Summary  Date Value Ref Range Status  09/16/2016 FINAL  Final    Comment:    ==================================================================== TOXASSURE COMP DRUG ANALYSIS,UR ==================================================================== Test                             Result       Flag       Units Drug Present and Declared for Prescription Verification   Gabapentin                     PRESENT      EXPECTED Drug Present not Declared for Prescription Verification   Oxazepam                       125          UNEXPECTED ng/mg creat    Oxazepam may be administered as a scheduled prescription    medication; it is also an expected metabolite of other    benzodiazepine drugs, including diazepam, chlordiazepoxide,    prazepam, clorazepate, halazepam, and temazepam. Drug Absent but Declared for Prescription Verification   Oxycodone                      Not Detected UNEXPECTED ng/mg creat   Buprenorphine                  Not Detected UNEXPECTED ng/mg creat   Cyclobenzaprine                Not Detected UNEXPECTED   Tizanidine                     Not Detected UNEXPECTED    Tizanidine, as indicated in the declared medication list, is not    always detected even when used as directed.   Metaxalone                     Not Detected UNEXPECTED   Duloxetine                     Not Detected UNEXPECTED   Acetaminophen                  Not Detected UNEXPECTED    Acetaminophen, as indicated in the declared medication list, is    not always detected even when used as directed.   Salicylate                     Not Detected UNEXPECTED    Aspirin, as indicated in the declared medication list, is not    always detected even when used as directed.    Lidocaine                      Not Detected UNEXPECTED    Lidocaine, as indicated in the declared medication list, is not    always detected even when used as directed. ==================================================================== Test                      Result    Flag   Units      Ref  Range   Creatinine              24               mg/dL      >=20 ==================================================================== Declared Medications:  The flagging and interpretation on this report are based on the  following declared medications.  Unexpected results may arise from  inaccuracies in the declared medications.  **Note: The testing scope of this panel includes these medications:  Buprenorphine  Cyclobenzaprine (Flexeril)  Duloxetine (Cymbalta)  Gabapentin (Neurontin)  Metaxalone  Oxycodone (Percocet)  **Note: The testing scope of this panel does not include small to  moderate amounts of these reported medications:  Acetaminophen (Percocet)  Aspirin  Lidocaine (Lidoderm)  Tizanidine (Zanaflex)  **Note: The testing scope of this panel does not include following  reported medications:  Atorvastatin  Carvedilol  Clopidogrel  Ezetimibe (Zetia)  Fenofibrate (Tricor)  Folic acid  Lisinopril  Melatonin  Methotrexate  Multivitamin  Nitroglycerin  Omeprazole  Pantoprazole (Protonix)  Sucralfate (Carafate) ==================================================================== For clinical consultation, please call [phone]. ====================================================================    UDS interpretation: Compliant          Medication Assessment Form: Reviewed. Patient indicates being compliant with therapy Treatment compliance: Compliant Risk Assessment Profile: Aberrant behavior: See prior evaluations. None observed or detected today Comorbid factors increasing risk of overdose: See prior notes. No additional risks detected today Risk of substance use  disorder (SUD): Low Opioid Risk Tool - 01/23/17 0851      Family History of Substance Abuse   Alcohol  Negative    Illegal Drugs  Negative    Rx Drugs  Negative      Personal History of Substance Abuse   Alcohol  Negative    Illegal Drugs  Negative    Rx Drugs  Negative      Age   Age between 66-45 years   No      History of Preadolescent Sexual Abuse   History of Preadolescent Sexual Abuse  Negative or Female      Psychological Disease   Psychological Disease  Negative    Depression  Negative      Total Score   Opioid Risk Tool Scoring  0    Opioid Risk Interpretation  Low Risk      ORT Scoring interpretation table:  Score <3 = Low Risk for SUD  Score between 4-7 = Moderate Risk for SUD  Score >8 = High Risk for Opioid Abuse   Risk Mitigation Strategies:  Patient Counseling: Covered Patient-Prescriber Agreement (PPA): Present and active  Notification to other healthcare providers: Done  Pharmacologic Plan: No change in therapy, at this time  Laboratory Chemistry  Inflammation Markers (CRP: Acute Phase) (ESR: Chronic Phase) No results found for: CRP, ESRSEDRATE, LATICACIDVEN               Rheumatology Markers No results found for: RF, ANA, Therisa Doy[place], [facility]              Renal Function Markers Lab Results  Component Value Date   BUN 7 11/21/2016   CREATININE 0.64 11/21/2016   GFRAA >60 11/21/2016   GFRNONAA >60 11/21/2016                 Hepatic Function Markers Lab Results  Component Value Date   AST 22 11/21/2016   ALT 18 11/21/2016   ALBUMIN 4.2 11/21/2016   ALKPHOS 78 11/21/2016   LIPASE 21 11/21/2016  Electrolytes Lab Results  Component Value Date   NA 141 11/21/2016   K 3.7 11/21/2016   CL 105 11/21/2016   CALCIUM 9.4 11/21/2016                 Neuropathy Markers Lab Results  Component Value Date   HGBA1C 6.3 (H) 01/02/2015                 Bone Pathology Markers No results found for: [place],  [id number], [id number], [id number], 25OHVITD1, 25OHVITD2, 25OHVITD3, TESTOFREE, TESTOSTERONE               Coagulation Parameters Lab Results  Component Value Date   INR 1.02 01/02/2015   LABPROT 13.6 01/02/2015   APTT 35 01/02/2015   PLT 349 11/21/2016                 Cardiovascular Markers Lab Results  Component Value Date   CKMB 5.3 (H) 05/28/2014   TROPONINI <0.03 11/21/2016   HGB 12.4 11/21/2016   HCT 36.0 11/21/2016                 CA Markers No results found for: CEA, CA125, LABCA2               Note: Lab results reviewed.  Recent Diagnostic Imaging Results  MR Lumbar Spine Wo Contrast CLINICAL DATA:  61 year old female with lumbar back pain radiating to the left leg and foot with burning numbness and tingling. Pain with walking and some weakness for 5 weeks. Initial encounter.  EXAM: MRI LUMBAR SPINE WITHOUT CONTRAST  TECHNIQUE: Multiplanar, multisequence MR imaging of the lumbar spine was performed. No intravenous contrast was administered.  COMPARISON:  None.  FINDINGS: Lumbar segmentation appears to be normal and will be designated as such for this report. Very mild levoconvex scoliosis. Straightening of lumbar lordosis. Mild grade 1 anterolisthesis at L3 L for. No marrow edema or evidence of acute osseous abnormality.  No signal abnormality in the visualized lower thoracic spinal cord, abnormal mass effect on the cord at T11- T12 is described below. Conus medullaris appears normal at L1.  Visualized abdominal viscera and paraspinal soft tissues are within normal limits.  T11-T12: Disc space loss, disc desiccation, and moderate size left paracentral disc extrusion (series 2, image 6) superimposed on circumferential disc bulge. Mild facet hypertrophy. Moderate spinal stenosis with mass effect on the ventral cord. No definite cord signal abnormality. No T11 foraminal involvement.  T12-L1: Minor disc desiccation and circumferential disc bulge.  No stenosis.  L1-L2:  Mild mostly anterior disc bulging.  No stenosis.  L2-L3: Disc space loss with moderate circumferential disc bulge. Broad-based posterior component of disc with moderate facet and ligament flavum hypertrophy. Mild epidural lipomatosis. Moderate spinal stenosis. Borderline to mild left L2 foraminal stenosis.  L3-L4: Grade 1 anterolisthesis. Disc desiccation. Circumferential disc bulge with broad-based posterior component. Severe facet hypertrophy were thus on the right. Right side facet joint fluid. Severe spinal and right greater than left lateral recess stenosis. Mild right greater than left L3 foraminal stenosis.  There is a moderate to large sequestered disc fragment in the left epidural space posterior to the L4 vertebral body (series 3, image 15, series 2, image 15 and series 6, image 30) encompassing 11 x 13 x 16 mm (AP by transverse by CC). I favor this originated from the L4-L5 disc space in light of the bulky left foraminal extrusion at that level (described below). Associated severe stenosis along the descending and proximal exiting left  L4 nerve.  L4-L5: Vacuum disc. Circumferential disc osteophyte complex with bulky left foraminal disc extrusion best seen on series 6, image 33. Moderate to severe facet and ligament flavum hypertrophy.  Severe left L4 foraminal stenosis. Moderate spinal and bilateral lateral recess stenosis. Mild right L4 foraminal stenosis.  L5-S1: Far lateral disc osteophyte complex. Mild to moderate facet hypertrophy. Mild epidural lipomatosis. No stenosis.  IMPRESSION: 1. Grade 1 anterolisthesis at L3-L4 and advanced disc disease at L4-L5. Probably originating from the latter is a bulky sequestered disc fragment in the left epidural space posterior to the L4 vertebral body. Superimposed bulky left foraminal disc extrusion at L4-L5. Superimposed bulky facet degeneration at L3-L4. 2. Subsequent severe spinal and lateral recess  stenosis at L3-L4, severe stenosis along the course of the descending and exiting left L4 nerve, and moderate spinal and bilateral lateral recess stenosis at L4-L5. 3. Fairly advanced disc and posterior element degeneration at L2-L3 resulting in moderate spinal stenosis. 4. Bulky lower thoracic disc herniation at T11-T12 resulting in spinal stenosis with moderate mass effect on the lower thoracic spinal cord, no definite cord signal abnormality.  Electronically Signed   By: Genevie Ann M.D.   On: 05/11/2015 15:14  Complexity Note: Imaging results reviewed. Results shared with Ms. Eulas Post, using Layman's terms.                         Meds   Current Outpatient Medications:  .  acetaminophen-codeine (TYLENOL #3) 300-30 MG tablet, Take 2 tablets by mouth 3 (three) times daily., Disp: , Rfl:  .  alum & mag hydroxide-simeth (MAALOX MAX) 400-400-40 MG/5ML suspension, Take 5 mLs by mouth every 6 (six) hours as needed for indigestion., Disp: 355 mL, Rfl: 0 .  aspirin EC 81 MG tablet, Take 1 tablet (81 mg total) by mouth daily., Disp: 30 tablet, Rfl: 0 .  atorvastatin (LIPITOR) 80 MG tablet, Take 80 mg by mouth every evening., Disp: , Rfl:  .  carvedilol (COREG) 12.5 MG tablet, Take 1 tablet (12.5 mg total) by mouth 2 (two) times daily with a meal., Disp: 30 tablet, Rfl: 0 .  clopidogrel (PLAVIX) 75 MG tablet, Take 1 tablet (75 mg total) by mouth daily., Disp: 30 tablet, Rfl: 0 .  cyclobenzaprine (FLEXERIL) 10 MG tablet, Take 10 mg by mouth at bedtime., Disp: , Rfl:  .  ezetimibe (ZETIA) 10 MG tablet, Take 10 mg by mouth daily., Disp: , Rfl:  .  FOLIC ACID PO, Take 1 tablet by mouth every morning. , Disp: , Rfl:  .  gabapentin (NEURONTIN) 300 MG capsule, Take 600 mg by mouth 2 (two) times daily. , Disp: , Rfl:  .  lidocaine (XYLOCAINE) 2 % solution, Use as directed 20 mLs in the mouth or throat as needed for mouth pain., Disp: 100 mL, Rfl: 0 .  lisinopril (PRINIVIL,ZESTRIL) 10 MG tablet, Take 1  tablet (10 mg total) by mouth daily., Disp: 30 tablet, Rfl: 0 .  Melatonin 5 MG CAPS, Take 10-30 mg by mouth at bedtime. , Disp: , Rfl:  .  Multiple Vitamins-Minerals (MULTIVITAMIN PO), Take 1 tablet by mouth every morning., Disp: , Rfl:  .  nitroGLYCERIN (NITROSTAT) 0.4 MG SL tablet, Place 1 tablet (0.4 mg total) under the tongue every 5 (five) minutes as needed for chest pain., Disp: 25 tablet, Rfl: 3 .  oxyCODONE-acetaminophen (PERCOCET) 10-325 MG tablet, Take 1 tablet by mouth every 6 (six) hours as needed for pain., Disp: 120 tablet,  Rfl: 0 .  riTUXimab (RITUXAN) 100 MG/10ML injection, Inject into the vein., Disp: , Rfl:  .  sucralfate (CARAFATE) 1 G tablet, Take 1 tablet (1 g total) by mouth 4 (four) times daily -  before meals and at bedtime., Disp: 60 tablet, Rfl: 0 .  clindamycin (CLEOCIN) 150 MG capsule, Take 150 mg 3 (three) times daily by mouth., Disp: , Rfl:  .  fenofibrate (TRICOR) 48 MG tablet, Take 1 tablet (48 mg total) by mouth daily. (Patient not taking: Reported on 11/21/2016), Disp: 30 tablet, Rfl: 6 .  omeprazole (PRILOSEC) 20 MG capsule, Take 20 mg by mouth 2 (two) times daily before a meal., Disp: , Rfl:  .  pantoprazole (PROTONIX) 40 MG tablet, Take 1 tablet (40 mg total) by mouth daily. (Patient not taking: Reported on 10/22/2016), Disp: 30 tablet, Rfl: 6 .  sucralfate (CARAFATE) 1 g tablet, Take 1 tablet (1 g total) by mouth 4 (four) times daily. (Patient not taking: Reported on 10/22/2016), Disp: 120 tablet, Rfl: 1 .  tiZANidine (ZANAFLEX) 4 MG capsule, Take 4 mg by mouth at bedtime. , Disp: , Rfl:   ROS  Constitutional: Denies any fever or chills Gastrointestinal: No reported hemesis, hematochezia, vomiting, or acute GI distress Musculoskeletal: Denies any acute onset joint swelling, redness, loss of ROM, or weakness Neurological: No reported episodes of acute onset apraxia, aphasia, dysarthria, agnosia, amnesia, paralysis, loss of coordination, or loss of  consciousness  Allergies  Ms. Seedorf is allergic to remicade [infliximab].  [place]  Drug: Ms. Berndt  reports that she does not use drugs. Alcohol:  reports that she does not drink alcohol. Tobacco:  reports that she has quit smoking. she has never used smokeless tobacco. Medical:  has a past medical history of CAD (coronary artery disease), Diastolic dysfunction, Gastric ulcer, HLD (hyperlipidemia), Hypertension, Myocardial infarction [facility]) (may 2016, December 2016), Obesity, and RA (rheumatoid arthritis) ([place]). Surgical: Ms. Monk  has a past surgical history that includes Cardiac catheterization (N/A, 05/30/2014); Cardiac catheterization (N/A, 05/30/2014); Cardiac catheterization (N/A, 01/02/2015); and Cardiac catheterization (N/A, 01/02/2015). Family: family history includes Heart Problems in her brother, brother, and brother; Heart attack in her father; Heart disease in her father.  Constitutional Exam  General appearance: Well nourished, well developed, and well hydrated. In no apparent acute distress Vitals:   01/23/17 0833  BP: 134/75  Pulse: 78  Temp: 97.9 F (36.6 C)  SpO2: 97%  Weight: 197 lb (89.4 kg)  Height: _0  (1.6 m)   BMI Assessment: Estimated body mass index is 34.9 kg/m as calculated from the following:   Height as of this encounter: _1  (1.6 m).   Weight as of this encounter: 197 lb (89.4 kg).  BMI interpretation table: BMI level Category Range association with higher incidence of chronic pain  <18 kg/m2 Underweight   18.5-24.9 kg/m2 Ideal body weight   25-29.9 kg/m2 Overweight Increased incidence by 20%  30-34.9 kg/m2 Obese (Class I) Increased incidence by 68%  35-39.9 kg/m2 Severe obesity (Class II) Increased incidence by 136%  >40 kg/m2 Extreme obesity (Class III) Increased incidence by 254%   BMI Readings from Last 4 Encounters:  01/23/17 34.90 kg/m  12/03/16 37.31 kg/m  11/21/16 36.14 kg/m  10/22/16 37.38 kg/m   Wt Readings from Last 4  Encounters:  01/23/17 197 lb (89.4 kg)  11/21/16 204 lb (92.5 kg)  10/22/16 211 lb (95.7 kg)  10/02/16 209 lb (94.8 kg)  Psych/Mental status: Alert, oriented x 3 (person, place, & time)  Eyes: PERLA Respiratory: No evidence of acute respiratory distress  Cervical Spine Area Exam  Skin & Axial Inspection: No masses, redness, edema, swelling, or associated skin lesions Alignment: Symmetrical Functional ROM: Unrestricted ROM      Stability: No instability detected Muscle Tone/Strength: Functionally intact. No obvious neuro-muscular anomalies detected. Sensory (Neurological): Unimpaired Palpation: No palpable anomalies              Upper Extremity (UE) Exam    Side: Right upper extremity  Side: Left upper extremity  Skin & Extremity Inspection: Skin color, temperature, and hair growth are WNL. No peripheral edema or cyanosis. No masses, redness, swelling, asymmetry, or associated skin lesions. No contractures.  Skin & Extremity Inspection: Skin color, temperature, and hair growth are WNL. No peripheral edema or cyanosis. No masses, redness, swelling, asymmetry, or associated skin lesions. No contractures.  Functional ROM: Unrestricted ROM          Functional ROM: Unrestricted ROM          Muscle Tone/Strength: Functionally intact. No obvious neuro-muscular anomalies detected.  Muscle Tone/Strength: Functionally intact. No obvious neuro-muscular anomalies detected.  Sensory (Neurological): Unimpaired          Sensory (Neurological): Unimpaired          Palpation: No palpable anomalies              Palpation: No palpable anomalies              Specialized Test(s): Deferred         Specialized Test(s): Deferred          Thoracic Spine Area Exam  Skin & Axial Inspection: No masses, redness, or swelling Alignment: Symmetrical Functional ROM: Unrestricted ROM Stability: No instability detected Muscle Tone/Strength: Functionally intact. No obvious neuro-muscular anomalies detected. Sensory  (Neurological): Unimpaired Muscle strength & Tone: No palpable anomalies  Lumbar Spine Area Exam  Skin & Axial Inspection: No masses, redness, or swelling Alignment: Symmetrical Functional ROM: Unrestricted ROM      Stability: No instability detected Muscle Tone/Strength: Functionally intact. No obvious neuro-muscular anomalies detected. Sensory (Neurological): Unimpaired Palpation: No palpable anomalies       Provocative Tests: Lumbar Hyperextension and rotation test: Positive bilaterally for facet joint pain. Lumbar Lateral bending test: evaluation deferred today       Patrick's Maneuver: evaluation deferred today                    Gait & Posture Assessment  Ambulation: Unassisted Gait: Relatively normal for age and body habitus Posture: WNL   Lower Extremity Exam    Side: Right lower extremity  Side: Left lower extremity  Skin & Extremity Inspection: Skin color, temperature, and hair growth are WNL. No peripheral edema or cyanosis. No masses, redness, swelling, asymmetry, or associated skin lesions. No contractures.  Skin & Extremity Inspection: Skin color, temperature, and hair growth are WNL. No peripheral edema or cyanosis. No masses, redness, swelling, asymmetry, or associated skin lesions. No contractures.  Functional ROM: Unrestricted ROM          Functional ROM: Unrestricted ROM          Muscle Tone/Strength: Functionally intact. No obvious neuro-muscular anomalies detected.  Muscle Tone/Strength: Functionally intact. No obvious neuro-muscular anomalies detected.  Sensory (Neurological): Unimpaired  Sensory (Neurological): Unimpaired  Palpation: No palpable anomalies  Palpation: No palpable anomalies     Assessment  Primary Diagnosis & Pertinent Problem List: The primary encounter diagnosis was Rheumatoid arthritis involving multiple sites, unspecified rheumatoid  factor presence ([place]). Diagnoses of Lumbar spondylosis, Myofascial pain, Chronic pain syndrome, and  Arthralgia, unspecified joint were also pertinent to this visit.  Status Diagnosis  Controlled Controlled Controlled 1. Rheumatoid arthritis involving multiple sites, unspecified rheumatoid factor presence ([place])   2. Lumbar spondylosis   3. Myofascial pain   4. Chronic pain syndrome   5. Arthralgia, unspecified joint      61 year old female with a past medical history of seropositive rheumatoid arthritis diagnosed in 1996 was been on chronic opioid therapy since around that time. Patient was previously seen by [facility] rheumatology however was having difficulty contacting and arranging follow-up with them. They were primarily prescribing her opioid medications. They tried to wean her Percocet 10 mg from 6 times a day to 5 times a day but the patient was unable to tolerate it. She did experience opioid withdrawal and had to go to the emergency department. Patient did follow up with the [facility] pain management who prescribed her a Butrans patch 10 g. Patient states that this helped for the first couple days but is not very effective.   Presents today doing relatively well, no significant changes in her past medical history. Patient states that medications help manage her pain, help her perform her activities of daily living including taking care of her daughter and granddaughter.  Plan: -Refill provided, Percocet 10 mg 1 tablet every 6 hours as needed for severe pain, quantity 120 month. Prescription provided for 2 months. Not to exceed this dose. -Continue all other medications as prescribed including gabapentin 900 mg 3 times a day, tizanidine 4 mg 3 times a day when necessary -Discussion of interventional procedures including genicular nerve blocks for knee pain and lumbar facet blocks after patient is off of Plavix -follow-up in8 wks   Plan of Care  Pharmacotherapy (Medications Ordered): Meds ordered this encounter  Medications  . DISCONTD: oxyCODONE-acetaminophen (PERCOCET) 10-325 MG tablet     Sig: Take 1 tablet by mouth every 6 (six) hours as needed for pain.    Dispense:  120 tablet    Refill:  0    For Chronic Pain DNF:  02/24/16, 03/25/16 Okay to fill early by 1 day if  weekend or pharmacy closed on refill date  . DISCONTD: oxyCODONE-acetaminophen (PERCOCET) 10-325 MG tablet    Sig: Take 1 tablet by mouth every 6 (six) hours as needed for pain.    Dispense:  120 tablet    Refill:  0    For Chronic Pain DNF:  02/24/16, 03/25/16 Okay to fill early by 1 day if  weekend or pharmacy closed on refill date  . DISCONTD: oxyCODONE-acetaminophen (PERCOCET) 10-325 MG tablet    Sig: Take 1 tablet by mouth every 6 (six) hours as needed for pain.    Dispense:  120 tablet    Refill:  0    For Chronic Pain DNF:  02/23/17, 03/25/17 Okay to fill early by 1 day if  weekend or pharmacy closed on refill date  . oxyCODONE-acetaminophen (PERCOCET) 10-325 MG tablet    Sig: Take 1 tablet by mouth every 6 (six) hours as needed for pain.    Dispense:  120 tablet    Refill:  0    For Chronic Pain DNF:  02/23/17, 03/25/17 Okay to fill early by 1 day if  weekend or pharmacy closed on refill date    Pharmacological options: Membrane stabilizer:To be determined at a later timecurrently on gabapentin, can consider Lyrica or Topamax in the future   Muscle  relaxant:has tried Skelaxin, Flexeril, tizanidine. Can consider baclofen.  NSAID:Medically contraindicated,   Other analgesic(s):To be determined at a later time, Trial of Cymbalta at first visit. Can also consider TCAs.     Interventional management options:hx of MI and is on dual antiplatelet therapy. She can be evaluated for interventions once she is off her blood thinners. Considerations below:   Considering:  -Genicular nerve blocks bilaterally  -Lumbar medial branch block with goal radiofrequency ablation -Intra-articular knee steroid injections. -SI joint injections bilaterally     Provider-requested follow-up: Return in about 10  weeks (around 04/03/2017) for Medication Management.  Future Appointments  Date Time Provider [place]  03/27/2017  8:45 AM Vevelyn Francois, NP [facility] None    Primary Care Physician: Denton Lank, MD Location: [facility] Note by: Gillis Santa, M.D Date: 01/23/2017; Time: 9:53 AM  Patient Instructions   Patient was given prescriptions for oxycodone with instructions.

## 2017-01-23 NOTE — Patient Instructions (Addendum)
  Patient was given prescriptions for oxycodone with instructions.

## 2017-01-28 DIAGNOSIS — C55 Malignant neoplasm of uterus, part unspecified: Secondary | ICD-10-CM

## 2017-01-28 HISTORY — DX: Malignant neoplasm of uterus, part unspecified: C55

## 2017-02-06 ENCOUNTER — Other Ambulatory Visit: Payer: Self-pay | Admitting: Family Medicine

## 2017-02-06 DIAGNOSIS — Z1231 Encounter for screening mammogram for malignant neoplasm of breast: Secondary | ICD-10-CM

## 2017-02-18 ENCOUNTER — Telehealth: Payer: Self-pay | Admitting: Student in an Organized Health Care Education/Training Program

## 2017-02-18 NOTE — Telephone Encounter (Signed)
Patient gets meds at [facility], they are not open on Sat or Sun and meds are due to be filled Sun. Wants to get filled on Friday but pharmacy says they cannot fill 2 days early. Please help patient with this problem

## 2017-02-18 NOTE — Telephone Encounter (Signed)
Voicemail left with patient that I would approve the fill for Friday and that she would need to make the medications last based her original medication dates.  I also told her that if [facility] was unable to fill d/t insurance stipulations that she could take to a different pharmacy and to please let us know which pharmacy she is using.  Both [facility] and patient were left voicemails reflecting this information.

## 2017-02-18 NOTE — Telephone Encounter (Signed)
Patient called back re; Rx and approval to fill on Friday instead of Sunday.  Patient states that [facility] will not do this and that she needs a new Rx written to fill on 02/21/17 instead of 02/23/17 which is on Sunday.  Patient states she understands that she needs to make medication last until original fill date.

## 2017-02-19 ENCOUNTER — Telehealth: Payer: Self-pay | Admitting: *Deleted

## 2017-02-19 MED ORDER — OXYCODONE-ACETAMINOPHEN 10-325 MG PO TABS: 1.0000 | ORAL_TABLET | Freq: Four times a day (QID) | ORAL | 0 refills | Status: DC | PRN

## 2017-02-19 NOTE — Telephone Encounter (Signed)
Patient called yesterday and stated that her original prescription could not be filled due to pharmacy being closed over the weekend.  Pharmacy unable to fill day earlier.  I have instructed staff to notify patient that she can come in to pick prescription to fill on February 21, 2017 but that this must last for 30 days.  She must bring her original prescription which is dated for January 27 and this will be discarded.  Requested Prescriptions   Signed Prescriptions Disp Refills  . oxyCODONE-acetaminophen (PERCOCET) 10-325 MG tablet 120 tablet 0    Sig: Take 1 tablet by mouth every 6 (six) hours as needed for pain.    Authorizing Provider: Gillis Santa  DNF 1/25

## 2017-02-19 NOTE — Telephone Encounter (Signed)
Patient came in and picked up new script dated to fill 02-21-2017. She understands that it MUST last until the original date of 03-25-2017. Script dated to fill 02-23-2017 was destroyed via shred.

## 2017-02-19 NOTE — Telephone Encounter (Signed)
Patient called and notified that DR Holley Raring has written another Rx to fill on 02/21/17 and she will need to bring original back and we will replace with new Rx.

## 2017-02-21 ENCOUNTER — Ambulatory Visit
Admission: RE | Admit: 2017-02-21 | Discharge: 2017-02-21 | Disposition: A | Discharge status: Home / Self Care | Payer: Medicare Other | Source: Ambulatory Visit | Attending: Family Medicine | Admitting: Family Medicine

## 2017-02-21 DIAGNOSIS — Z1231 Encounter for screening mammogram for malignant neoplasm of breast: Secondary | ICD-10-CM | POA: Diagnosis present

## 2017-03-27 ENCOUNTER — Ambulatory Visit: Payer: Medicare Other | Attending: Nurse Practitioner | Admitting: Nurse Practitioner

## 2017-03-27 ENCOUNTER — Encounter: Payer: Self-pay | Admitting: Nurse Practitioner

## 2017-03-27 VITALS — BP 113/32 | HR 97 | Temp 98.4°F | Resp 16 | Ht 63.0 in | Wt 197.0 lb

## 2017-03-27 DIAGNOSIS — Z955 Presence of coronary angioplasty implant and graft: Secondary | ICD-10-CM | POA: Diagnosis not present

## 2017-03-27 DIAGNOSIS — M545 Low back pain: Secondary | ICD-10-CM | POA: Insufficient documentation

## 2017-03-27 DIAGNOSIS — Z79899 Other long term (current) drug therapy: Secondary | ICD-10-CM | POA: Insufficient documentation

## 2017-03-27 DIAGNOSIS — I25119 Atherosclerotic heart disease of native coronary artery with unspecified angina pectoris: Secondary | ICD-10-CM | POA: Insufficient documentation

## 2017-03-27 DIAGNOSIS — I10 Essential (primary) hypertension: Secondary | ICD-10-CM | POA: Insufficient documentation

## 2017-03-27 DIAGNOSIS — Z87891 Personal history of nicotine dependence: Secondary | ICD-10-CM | POA: Insufficient documentation

## 2017-03-27 DIAGNOSIS — I214 Non-ST elevation (NSTEMI) myocardial infarction: Secondary | ICD-10-CM | POA: Diagnosis not present

## 2017-03-27 DIAGNOSIS — M059 Rheumatoid arthritis with rheumatoid factor, unspecified: Secondary | ICD-10-CM | POA: Diagnosis not present

## 2017-03-27 DIAGNOSIS — M0579 Rheumatoid arthritis with rheumatoid factor of multiple sites without organ or systems involvement: Secondary | ICD-10-CM

## 2017-03-27 DIAGNOSIS — Z7982 Long term (current) use of aspirin: Secondary | ICD-10-CM | POA: Diagnosis not present

## 2017-03-27 DIAGNOSIS — M79641 Pain in right hand: Secondary | ICD-10-CM | POA: Diagnosis not present

## 2017-03-27 DIAGNOSIS — E785 Hyperlipidemia, unspecified: Secondary | ICD-10-CM | POA: Diagnosis not present

## 2017-03-27 DIAGNOSIS — M79642 Pain in left hand: Secondary | ICD-10-CM | POA: Insufficient documentation

## 2017-03-27 DIAGNOSIS — G894 Chronic pain syndrome: Secondary | ICD-10-CM

## 2017-03-27 DIAGNOSIS — Z5181 Encounter for therapeutic drug level monitoring: Secondary | ICD-10-CM | POA: Diagnosis present

## 2017-03-27 DIAGNOSIS — Z79891 Long term (current) use of opiate analgesic: Secondary | ICD-10-CM | POA: Diagnosis not present

## 2017-03-27 DIAGNOSIS — M5412 Radiculopathy, cervical region: Secondary | ICD-10-CM | POA: Diagnosis not present

## 2017-03-27 DIAGNOSIS — Z9889 Other specified postprocedural states: Secondary | ICD-10-CM | POA: Insufficient documentation

## 2017-03-27 DIAGNOSIS — M47816 Spondylosis without myelopathy or radiculopathy, lumbar region: Secondary | ICD-10-CM | POA: Diagnosis not present

## 2017-03-27 MED ORDER — OXYCODONE-ACETAMINOPHEN 10-325 MG PO TABS: 1.0000 | ORAL_TABLET | Freq: Four times a day (QID) | ORAL | 0 refills | Status: DC | PRN

## 2017-03-27 NOTE — Progress Notes (Signed)
Patient's Name: Karen Cervantes  MRN: [id number]  Referring Provider: Denton Lank, MD  DOB: April 19, 1955  PCP: Denton Lank, MD  DOS: 03/27/2017  Note by: Vevelyn Francois NP  Service setting: Ambulatory outpatient  Specialty: Interventional Pain Management  Location: [facility] (AMB) Pain Management Facility    Patient type: Established    Primary Reason(s) for Visit: Encounter for prescription drug management. (Level of risk: moderate)  CC: Back Pain (lower worse on the right); Joint Pain (RA); and Hand Pain (bilateral)  HPI  Karen Cervantes is a 62 y.o. year old, female patient, who comes today for a medication management evaluation. She has NSTEMI (non-ST elevated myocardial infarction) ([place]); CAD (coronary artery disease); Dyslipidemia, goal LDL below 70; Morbid obesity due to excess calories ([place]); Seropositive rheumatoid arthritis of multiple sites ([place]); Diastolic dysfunction; HTN (hypertension), benign; Ischemic chest pain; S/P coronary artery stent placement; Hidradenitis suppurativa; Ischemic heart disease due to coronary artery obstruction ([place]); Lumbar spondylosis; Pain medication agreement signed; Right cervical radiculopathy; Long term current use of opiate analgesic; and Chronic pain syndrome on their problem list. Her primarily concern today is the Back Pain (lower worse on the right); Joint Pain (RA); and Hand Pain (bilateral)  Pain Assessment: Location: Lower, Right Back Radiating: into the hips and legs to the bottom of feet occassionally  Onset: More than a month ago Duration: Chronic pain Quality: Discomfort, Constant, Sharp, Aching, Numbness, Tingling(some numbness and tingling in hands) Severity: 7 /10 (self-reported pain score)  Note: Reported level is compatible with observation. Clinically the patient looks like a 1/10 A 1/10 is viewed as "Mild" and described as nagging, annoying, but not interfering with basic activities of daily living (ADL). Karen Cervantes is able to eat, bathe, get  dressed, do toileting (being able to get on and off the toilet and perform personal hygiene functions), transfer (move in and out of bed or a chair without assistance), and maintain continence (able to control bladder and bowel functions). Physiologic parameters such as blood pressure and heart rate apear wnl. Information on the proper use of the pain scale provided to the patient today. When using our objective Pain Scale, levels between 6 and 10/10 are said to belong in an emergency room, as it progressively worsens from a 6/10, described as severely limiting, requiring emergency care not usually available at an outpatient pain management facility. At a 6/10 level, communication becomes difficult and requires great effort. Assistance to reach the emergency department may be required. Facial flushing and profuse sweating along with potentially dangerous increases in heart rate and blood pressure will be evident. Effect on ADL: walking is difficult and increases the pain  Timing: Constant Modifying factors: moist heat, rest, medications  Karen Cervantes was last scheduled for an appointment on Visit date not found for medication management. During today's appointment we reviewed Karen Cervantes's chronic pain status, as well as her outpatient medication regimen. She admits that she has a cyst on her right foot that is painful. She admits that walking from the car makes her pain worse. She denies any weakness. She is currently in between treatment for her RA.  The patient  reports that she does not use drugs. Her body mass index is 34.9 kg/m.  Further details on both, my assessment(s), as well as the proposed treatment plan, please see below.  Controlled Substance Pharmacotherapy Assessment REMS (Risk Evaluation and Mitigation Strategy)  Analgesic: Oxycodone/acetaminophen 10/325 mg 4 times daily oxycodone 40 mg per day MME/day: 60 mg/day.  Abelardo Diesel  G, RN  03/27/2017  9:12 AM  Sign at close  encounter Nursing Pain Medication Assessment:  Safety precautions to be maintained throughout the outpatient stay will include: orient to surroundings, keep bed in low position, maintain call bell within reach at all times, provide assistance with transfer out of bed and ambulation.  Medication Inspection Compliance: Pill count conducted under aseptic conditions, in front of the patient. Neither the pills nor the bottle was removed from the patient's sight at any time. Once count was completed pills were immediately returned to the patient in their original bottle.  Medication: Oxycodone/APAP Pill/Patch Count: 113 of 120 pills remain Pill/Patch Appearance: Markings consistent with prescribed medication Bottle Appearance: Standard pharmacy container. Clearly labeled. Filled Date: 02 / 26 / 2019 Last Medication intake:  Today   Pharmacokinetics: Liberation and absorption (onset of action): WNL Distribution (time to peak effect): WNL Metabolism and excretion (duration of action): WNL         Pharmacodynamics: Desired effects: Analgesia: Karen Cervantes reports >50% benefit. Functional ability: Patient reports that medication allows her to accomplish basic ADLs Clinically meaningful improvement in function (CMIF): Sustained CMIF goals met Perceived effectiveness: Described as relatively effective, allowing for increase in activities of daily living (ADL) Undesirable effects: Side-effects or Adverse reactions: None reported Monitoring: [place] PMP: Online review of the past 55-monthperiod conducted. Compliant with practice rules and regulations Last UDS on record: Summary  Date Value Ref Range Status  09/16/2016 FINAL  Final    Comment:    ==================================================================== TOXASSURE COMP DRUG ANALYSIS,UR ==================================================================== Test                             Result       Flag       Units Drug Present and Declared  for Prescription Verification   Gabapentin                     PRESENT      EXPECTED Drug Present not Declared for Prescription Verification   Oxazepam                       125          UNEXPECTED ng/mg creat    Oxazepam may be administered as a scheduled prescription    medication; it is also an expected metabolite of other    benzodiazepine drugs, including diazepam, chlordiazepoxide,    prazepam, clorazepate, halazepam, and temazepam. Drug Absent but Declared for Prescription Verification   Oxycodone                      Not Detected UNEXPECTED ng/mg creat   Buprenorphine                  Not Detected UNEXPECTED ng/mg creat   Cyclobenzaprine                Not Detected UNEXPECTED   Tizanidine                     Not Detected UNEXPECTED    Tizanidine, as indicated in the declared medication list, is not    always detected even when used as directed.   Metaxalone                     Not Detected UNEXPECTED   Duloxetine  Not Detected UNEXPECTED   Acetaminophen                  Not Detected UNEXPECTED    Acetaminophen, as indicated in the declared medication list, is    not always detected even when used as directed.   Salicylate                     Not Detected UNEXPECTED    Aspirin, as indicated in the declared medication list, is not    always detected even when used as directed.   Lidocaine                      Not Detected UNEXPECTED    Lidocaine, as indicated in the declared medication list, is not    always detected even when used as directed. ==================================================================== Test                      Result    Flag   Units      Ref Range   Creatinine              24               mg/dL      >=20 ==================================================================== Declared Medications:  The flagging and interpretation on this report are based on the  following declared medications.  Unexpected results may arise from   inaccuracies in the declared medications.  **Note: The testing scope of this panel includes these medications:  Buprenorphine  Cyclobenzaprine (Flexeril)  Duloxetine (Cymbalta)  Gabapentin (Neurontin)  Metaxalone  Oxycodone (Percocet)  **Note: The testing scope of this panel does not include small to  moderate amounts of these reported medications:  Acetaminophen (Percocet)  Aspirin  Lidocaine (Lidoderm)  Tizanidine (Zanaflex)  **Note: The testing scope of this panel does not include following  reported medications:  Atorvastatin  Carvedilol  Clopidogrel  Ezetimibe (Zetia)  Fenofibrate (Tricor)  Folic acid  Lisinopril  Melatonin  Methotrexate  Multivitamin  Nitroglycerin  Omeprazole  Pantoprazole (Protonix)  Sucralfate (Carafate) ==================================================================== For clinical consultation, please call [phone]. ====================================================================    UDS interpretation: Compliant          Medication Assessment Form: Reviewed. Patient indicates being compliant with therapy Treatment compliance: Compliant Risk Assessment Profile: Aberrant behavior: See prior evaluations. None observed or detected today Comorbid factors increasing risk of overdose: See prior notes. No additional risks detected today Risk of substance use disorder (SUD): Low Opioid Risk Tool - 03/27/17 0911      Family History of Substance Abuse   Alcohol  Negative    Illegal Drugs  Negative    Rx Drugs  Negative      Personal History of Substance Abuse   Alcohol  Negative    Illegal Drugs  Negative    Rx Drugs  Negative      Psychological Disease   Psychological Disease  Negative    Depression  Negative      Total Score   Opioid Risk Tool Scoring  0    Opioid Risk Interpretation  Low Risk      ORT Scoring interpretation table:  Score <3 = Low Risk for SUD  Score between 4-7 = Moderate Risk for SUD  Score >8 = High  Risk for Opioid Abuse   Risk Mitigation Strategies:  Patient Counseling: Covered Patient-Prescriber Agreement (PPA): Present and active  Notification to other healthcare providers: Done  Pharmacologic  Plan: No change in therapy, at this time.             Laboratory Chemistry  Inflammation Markers (CRP: Acute Phase) (ESR: Chronic Phase) No results found for: CRP, ESRSEDRATE, LATICACIDVEN                       Rheumatology Markers No results found for: RF, ANA, Therisa [place], [facility]              Renal Function Markers Lab Results  Component Value Date   BUN 7 11/21/2016   CREATININE 0.64 11/21/2016   GFRAA >60 11/21/2016   GFRNONAA >60 11/21/2016                 Hepatic Function Markers Lab Results  Component Value Date   AST 22 11/21/2016   ALT 18 11/21/2016   ALBUMIN 4.2 11/21/2016   ALKPHOS 78 11/21/2016   LIPASE 21 11/21/2016                 Electrolytes Lab Results  Component Value Date   NA 141 11/21/2016   K 3.7 11/21/2016   CL 105 11/21/2016   CALCIUM 9.4 11/21/2016                        Neuropathy Markers Lab Results  Component Value Date   HGBA1C 6.3 (H) 01/02/2015                 Bone Pathology Markers No results found for: VD25OH, KN397QB3ALP, FX9024OX7, DZ3299ME2, 25OHVITD1, 25OHVITD2, 25OHVITD3, TESTOFREE, TESTOSTERONE                       Coagulation Parameters Lab Results  Component Value Date   INR 1.02 01/02/2015   LABPROT 13.6 01/02/2015   APTT 35 01/02/2015   PLT 349 11/21/2016                 Cardiovascular Markers Lab Results  Component Value Date   CKMB 5.3 (H) 05/28/2014   TROPONINI <0.03 11/21/2016   HGB 12.4 11/21/2016   HCT 36.0 11/21/2016                 CA Markers No results found for: CEA, CA125, LABCA2               Note: Lab results reviewed.  Recent Diagnostic Imaging Results   Complexity Note: Imaging results reviewed. Results shared with Karen Cervantes, using Layman's terms.                          Meds   Current Outpatient Medications:  .  alum & mag hydroxide-simeth (MAALOX MAX) 400-400-40 MG/5ML suspension, Take 5 mLs by mouth every 6 (six) hours as needed for indigestion., Disp: 355 mL, Rfl: 0 .  aspirin EC 81 MG tablet, Take 1 tablet (81 mg total) by mouth daily., Disp: 30 tablet, Rfl: 0 .  atorvastatin (LIPITOR) 80 MG tablet, Take 80 mg by mouth every evening., Disp: , Rfl:  .  carvedilol (COREG) 12.5 MG tablet, Take 1 tablet (12.5 mg total) by mouth 2 (two) times daily with a meal., Disp: 30 tablet, Rfl: 0 .  clopidogrel (PLAVIX) 75 MG tablet, Take 1 tablet (75 mg total) by mouth daily., Disp: 30 tablet, Rfl: 0 .  cyclobenzaprine (FLEXERIL) 10 MG tablet, Take 10 mg by mouth at bedtime., Disp: , Rfl:  .  ezetimibe (ZETIA) 10 MG tablet, Take 10 mg by mouth daily., Disp: , Rfl:  .  FOLIC ACID PO, Take 1 tablet by mouth every morning. , Disp: , Rfl:  .  gabapentin (NEURONTIN) 300 MG capsule, Take 600 mg by mouth 2 (two) times daily. , Disp: , Rfl:  .  lidocaine (XYLOCAINE) 2 % solution, Use as directed 20 mLs in the mouth or throat as needed for mouth pain., Disp: 100 mL, Rfl: 0 .  lisinopril (PRINIVIL,ZESTRIL) 10 MG tablet, Take 1 tablet (10 mg total) by mouth daily., Disp: 30 tablet, Rfl: 0 .  Melatonin 5 MG CAPS, Take 10-30 mg by mouth at bedtime. , Disp: , Rfl:  .  Multiple Vitamins-Minerals (MULTIVITAMIN PO), Take 1 tablet by mouth every morning., Disp: , Rfl:  .  nitroGLYCERIN (NITROSTAT) 0.4 MG SL tablet, Place 1 tablet (0.4 mg total) under the tongue every 5 (five) minutes as needed for chest pain., Disp: 25 tablet, Rfl: 3 .  omeprazole (PRILOSEC) 20 MG capsule, Take 20 mg by mouth 2 (two) times daily before a meal., Disp: , Rfl:  .  [START ON 05/23/2017] oxyCODONE-acetaminophen (PERCOCET) 10-325 MG tablet, Take 1 tablet by mouth every 6 (six) hours as needed for pain., Disp: 120 tablet, Rfl: 0 .  [START ON 04/24/2017] oxyCODONE-acetaminophen (PERCOCET) 10-325 MG  tablet, Take 1 tablet by mouth every 6 (six) hours as needed for pain., Disp: 120 tablet, Rfl: 0  ROS  Constitutional: Denies any fever or chills Gastrointestinal: No reported hemesis, hematochezia, vomiting, or acute GI distress Musculoskeletal: Denies any acute onset joint swelling, redness, loss of ROM, or weakness Neurological: No reported episodes of acute onset apraxia, aphasia, dysarthria, agnosia, amnesia, paralysis, loss of coordination, or loss of consciousness  Allergies  Karen Cervantes is allergic to remicade [infliximab].  [place]  Drug: Karen Cervantes  reports that she does not use drugs. Alcohol:  reports that she does not drink alcohol. Tobacco:  reports that she has quit smoking. she has never used smokeless tobacco. Medical:  has a past medical history of CAD (coronary artery disease), Diastolic dysfunction, Gastric ulcer, HLD (hyperlipidemia), Hypertension, Myocardial infarction [facility]) (may 2016, December 2016), Obesity, and RA (rheumatoid arthritis) ([place]). Surgical: Karen Cervantes  has a past surgical history that includes Cardiac catheterization (N/A, 05/30/2014); Cardiac catheterization (N/A, 05/30/2014); Cardiac catheterization (N/A, 01/02/2015); and Cardiac catheterization (N/A, 01/02/2015). Family: family history includes Heart Problems in her brother, brother, and brother; Heart attack in her father; Heart disease in her father.  Constitutional Exam  General appearance: Well nourished, well developed, and well hydrated. In no apparent acute distress Vitals:   03/27/17 0907  BP: (!) 113/32  Pulse: 97  Resp: 16  Temp: 98.4 F (36.9 C)  TempSrc: Oral  SpO2: 97%  Weight: 197 lb (89.4 kg)  Height: '5\' 3"'  (1.6 m)   BMI Assessment: Estimated body mass index is 34.9 kg/m as calculated from the following:   Height as of this encounter: '5\' 3"'  (1.6 m).   Weight as of this encounter: 197 lb (89.4 kg).  Psych/Mental status: Alert, oriented x 3 (person, place, & time)       Eyes:  PERLA Respiratory: No evidence of acute respiratory distress  Cervical Spine Area Exam  Skin & Axial Inspection: No masses, redness, edema, swelling, or associated skin lesions Alignment: Symmetrical Functional ROM: Unrestricted ROM      Stability: No instability detected Muscle Tone/Strength: Functionally intact. No obvious neuro-muscular anomalies detected. Sensory (Neurological): Unimpaired Palpation: No palpable anomalies  Upper Extremity (UE) Exam    Side: Right upper extremity  Side: Left upper extremity  Skin & Extremity Inspection: Skin color, temperature, and hair growth are WNL. No peripheral edema or cyanosis. No masses, redness, swelling, asymmetry, or associated skin lesions. No contractures.  Skin & Extremity Inspection: Skin color, temperature, and hair growth are WNL. No peripheral edema or cyanosis. No masses, redness, swelling, asymmetry, or associated skin lesions. No contractures.  Functional ROM: Unrestricted ROM          Functional ROM: Unrestricted ROM          Muscle Tone/Strength: Functionally intact. No obvious neuro-muscular anomalies detected.  Muscle Tone/Strength: Functionally intact. No obvious neuro-muscular anomalies detected.  Sensory (Neurological): Unimpaired          Sensory (Neurological): Unimpaired          Palpation: No palpable anomalies              Palpation: No palpable anomalies              Specialized Test(s): Deferred         Specialized Test(s): Deferred          Thoracic Spine Area Exam  Skin & Axial Inspection: No masses, redness, or swelling Alignment: Symmetrical Functional ROM: Unrestricted ROM Stability: No instability detected Muscle Tone/Strength: Functionally intact. No obvious neuro-muscular anomalies detected. Sensory (Neurological): Unimpaired Muscle strength & Tone: No palpable anomalies  Lumbar Spine Area Exam  Skin & Axial Inspection: No masses, redness, or swelling Alignment: Symmetrical Functional ROM:  Painful restricted ROM      Stability: No instability detected Muscle Tone/Strength: Functionally intact. No obvious neuro-muscular anomalies detected. Sensory (Neurological): Unimpaired Palpation: Complains of area being tender to palpation       Provocative Tests: Lumbar Hyperextension and rotation test: Unable to perform due to pain. Lumbar Lateral bending test: Unable to perform due to pain. Patrick's Maneuver: evaluation deferred today                    Gait & Posture Assessment  Ambulation: Unassisted Gait: Antalgic Posture: WNL   Lower Extremity Exam    Side: Right lower extremity  Side: Left lower extremity  Skin & Extremity Inspection: Skin color, temperature, and hair growth are WNL. No peripheral edema or cyanosis. No masses, redness, swelling, asymmetry, or associated skin lesions. No contractures.  Skin & Extremity Inspection: Skin color, temperature, and hair growth are WNL. No peripheral edema or cyanosis. No masses, redness, swelling, asymmetry, or associated skin lesions. No contractures.  Functional ROM: Unrestricted ROM          Functional ROM: Unrestricted ROM          Muscle Tone/Strength: Functionally intact. No obvious neuro-muscular anomalies detected.  Muscle Tone/Strength: Functionally intact. No obvious neuro-muscular anomalies detected.  Sensory (Neurological): Unimpaired  Sensory (Neurological): Unimpaired  Palpation: No palpable anomalies  Palpation: No palpable anomalies   Assessment  Primary Diagnosis & Pertinent Problem List: The primary encounter diagnosis was Seropositive rheumatoid arthritis of multiple sites ([place]). Diagnoses of Lumbar spondylosis, Right cervical radiculopathy, Chronic pain syndrome, and Long term current use of opiate analgesic were also pertinent to this visit.  Status Diagnosis  Controlled Controlled Controlled 1. Seropositive rheumatoid arthritis of multiple sites ([place])   2. Lumbar spondylosis   3. Right cervical radiculopathy    4. Chronic pain syndrome   5. Long term current use of opiate analgesic     Problems updated and reviewed during this visit:  Problem  Chronic Pain Syndrome  Seropositive Rheumatoid Arthritis of Multiple Sites ([place])   Overview:  Seropositive erosive RA Diagnosed 1985 HCQ x1 year distant past SSZ in past MTX - on and off, stopped due to intolerance. REMICADE 2002-  6 doses -- INFUSION REACTION HUMIRA 2004-2008-- loss of efficacy, frequent infections.  ORENCIA-- start 2008 LEFLUNOMIDE -- start 2010 PREDNISONE -- on and off for many years [place]- August 2016. Feb 2017 RITUXIMAB -1st cycle August 2016 ENBREL---> inadequate response   Right Cervical Radiculopathy  Hidradenitis Suppurativa  Lumbar Spondylosis  Pain Medication Agreement Signed   Overview:  [facility] AGREEMENT; Read, reviewed and signed. Copy given to patient. Original to Medical Records. [place] 09/03/16   S/P Coronary Artery Stent Placement  Ischemic chest pain  Ischemic Heart Disease Due to Coronary Artery Obstruction ([facility])  Htn (Hypertension), Benign  Cad (Coronary Artery Disease)   a. NSTEMI 04/2014; b. cardiac cath 05/30/2014: ost LAD 30%, mLAD 95% s/p PCI/DES, ost RCA 60%, pRCA 50%, dRCA 60%, EF >55% by echo   Dyslipidemia, Goal Ldl Below 70  Morbid Obesity Due to Excess Calories ([place])  Diastolic Dysfunction   a. echo 04/2014: EF >29%, no WMA, diastolic dysfunction, no valvular abnormalities, normal RVSP   Nstemi (Non-St Elevated Myocardial Infarction) ([facility])  Long Term Current Use of Opiate Analgesic   Plan of Care  Pharmacotherapy (Medications Ordered): Meds ordered this encounter  Medications  . oxyCODONE-acetaminophen (PERCOCET) 10-325 MG tablet    Sig: Take 1 tablet by mouth every 6 (six) hours as needed for pain.    Dispense:  120 tablet    Refill:  0    For Chronic Pain DNF:  05/23/2017 To last until 06/23/2017    Order Specific Question:   Supervising Provider    Answer:    Milinda Pointer [phone]  . oxyCODONE-acetaminophen (PERCOCET) 10-325 MG tablet    Sig: Take 1 tablet by mouth every 6 (six) hours as needed for pain.    Dispense:  120 tablet    Refill:  0    Do not place this medication, or any other prescription from our practice, on "Automatic Refill". Patient may have prescription filled one day early if pharmacy is closed on scheduled refill date. Do not fill until: 04/24/2017 To last until:05/24/2017    Order Specific Question:   Supervising Provider    Answer:   Milinda Pointer [phone]   New Prescriptions   OXYCODONE-ACETAMINOPHEN (PERCOCET) 10-325 MG TABLET    Take 1 tablet by mouth every 6 (six) hours as needed for pain.   Medications administered today: Karen Cervantes had no medications administered during this visit. Lab-work, procedure(s), and/or referral(s): Orders Placed This Encounter  Procedures  . ToxASSURE Select 13 (MW), Urine   Imaging and/or referral(s): None  Interventional therapies: Planned, scheduled, and/or pending:   Not at this time.    Provider-requested follow-up: Return in about 2 months (around 05/25/2017) for MedMgmt with Me Donella Stade [facility]).  Future Appointments  Date Time Provider [place]  05/22/2017  9:45 AM Vevelyn Francois, NP [facility] None   Primary Care Physician: Denton Lank, MD Location: [facility] Note by: Vevelyn Francois NP Date: 03/27/2017; Time: 10:05 AM  Pain Score Disclaimer: We use the NRS-11 scale. This is a self-reported, subjective measurement of pain severity with only modest accuracy. It is used primarily to identify changes within a particular patient. It must be understood that outpatient pain scales are significantly less accurate that those  used for research, where they can be applied under ideal controlled circumstances with minimal exposure to variables. In reality, the score is likely to be a combination of pain intensity and pain affect,  where pain affect describes the degree of emotional arousal or changes in action readiness caused by the sensory experience of pain. Factors such as social and work situation, setting, emotional state, anxiety levels, expectation, and prior pain experience may influence pain perception and show large inter-individual differences that may also be affected by time variables.  Patient instructions provided during this appointment: Patient Instructions   _______________________________________________________________________________You have been given 2 Rx for Percocet to last until 5/27/2019_____________  Medication Rules  Applies to: All patients receiving prescriptions (written or electronic).  Pharmacy of record: Pharmacy where electronic prescriptions will be sent. If written prescriptions are taken to a different pharmacy, please inform the nursing staff. The pharmacy listed in the electronic medical record should be the one where you would like electronic prescriptions to be sent.  Prescription refills: Only during scheduled appointments. Applies to both, written and electronic prescriptions.  NOTE: The following applies primarily to controlled substances (Opioid* Pain Medications).   Patient's responsibilities: 1. Pain Pills: Bring all pain pills to every appointment (except for procedure appointments). 2. Pill Bottles: Bring pills in original pharmacy bottle. Always bring newest bottle. Bring bottle, even if empty. 3. Medication refills: You are responsible for knowing and keeping track of what medications you need refilled. The day before your appointment, write a list of all prescriptions that need to be refilled. Bring that list to your appointment and give it to the admitting nurse. Prescriptions will be written only during appointments. If you forget a medication, it will not be "Called in", "Faxed", or "electronically sent". You will need to get another appointment to get these  prescribed. 4. Prescription Accuracy: You are responsible for carefully inspecting your prescriptions before leaving our office. Have the discharge nurse carefully go over each prescription with you, before taking them home. Make sure that your name is accurately spelled, that your address is correct. Check the name and dose of your medication to make sure it is accurate. Check the number of pills, and the written instructions to make sure they are clear and accurate. Make sure that you are given enough medication to last until your next medication refill appointment. 5. Taking Medication: Take medication as prescribed. Never take more pills than instructed. Never take medication more frequently than prescribed. Taking less pills or less frequently is permitted and encouraged, when it comes to controlled substances (written prescriptions).  6. Inform other Doctors: Always inform, all of your healthcare providers, of all the medications you take. 7. Pain Medication from other Providers: You are not allowed to accept any additional pain medication from any other Doctor or Healthcare provider. There are two exceptions to this rule. (see below) In the event that you require additional pain medication, you are responsible for notifying us, as stated below. 8. Medication Agreement: You are responsible for carefully reading and following our Medication Agreement. This must be signed before receiving any prescriptions from our practice. Safely store a copy of your signed Agreement. Violations to the Agreement will result in no further prescriptions. (Additional copies of our Medication Agreement are available upon request.) 9. Laws, Rules, & Regulations: All patients are expected to follow all Federal and [employer], [employer], Rules, [employer]. Ignorance of the Laws does not constitute a valid excuse. The use of any illegal substances is prohibited.  10. Adopted CDC guidelines & recommendations: Target dosing  levels will be at or below 60 MME/day. Use of benzodiazepines** is not recommended.  Exceptions: There are only two exceptions to the rule of not receiving pain medications from other Healthcare Providers. 1. Exception #1 (Emergencies): In the event of an emergency (i.e.: accident requiring emergency care), you are allowed to receive additional pain medication. However, you are responsible for: As soon as you are able, call our office [phone], at any time of the day or night, and leave a message stating your name, the date and nature of the emergency, and the name and dose of the medication prescribed. In the event that your call is answered by a member of our staff, make sure to document and save the date, time, and the name of the person that took your information.  2. Exception #2 (Planned Surgery): In the event that you are scheduled by another doctor or dentist to have any type of surgery or procedure, you are allowed (for a period no longer than 30 days), to receive additional pain medication, for the acute [place]-op pain. However, in this case, you are responsible for picking up a copy of our "[facility]ervantes-op Pain Management for Surgeons" handout, and giving it to your surgeon or dentist. This document is available at our office, and does not require an appointment to obtain it. Simply go to our office during business hours (Monday-Thursday from 8:00 AM to 4:00 PM) (Friday 8:00 AM to 12:00 Noon) or if you have a scheduled appointment with [place], prior to your surgery, and ask for it by name. In addition, you will need to provide [place] with your name, name of your surgeon, type of surgery, and date of procedure or surgery.  *Opioid medications include: morphine, codeine, oxycodone, oxymorphone, hydrocodone, hydromorphone, meperidine, tramadol, tapentadol, buprenorphine, fentanyl, methadone. **Benzodiazepine medications include: diazepam (Valium), alprazolam (Xanax), clonazepam (Klonopine), lorazepam (Ativan),  clorazepate (Tranxene), chlordiazepoxide (Librium), estazolam (Prosom), oxazepam (Serax), temazepam (Restoril), triazolam (Halcion) ____________________________________________________________________________________________  BMI Assessment: Estimated body mass index is 34.9 kg/m as calculated from the following:   Height as of this encounter: '5\' 3"'  (1.6 m).   Weight as of this encounter: 197 lb (89.4 kg).  BMI interpretation table: BMI level Category Range association with higher incidence of chronic pain  <18 kg/m2 Underweight   18.5-24.9 kg/m2 Ideal body weight   25-29.9 kg/m2 Overweight Increased incidence by 20%  30-34.9 kg/m2 Obese (Class I) Increased incidence by 68%  35-39.9 kg/m2 Severe obesity (Class II) Increased incidence by 136%  >40 kg/m2 Extreme obesity (Class III) Increased incidence by 254%   BMI Readings from Last 4 Encounters:  03/27/17 34.90 kg/m  01/23/17 34.90 kg/m  12/03/16 37.31 kg/m  11/21/16 36.14 kg/m   Wt Readings from Last 4 Encounters:  03/27/17 197 lb (89.4 kg)  01/23/17 197 lb (89.4 kg)  11/21/16 204 lb (92.5 kg)  10/22/16 211 lb (95.7 kg)

## 2017-03-27 NOTE — Patient Instructions (Addendum)
_______________________________________________________________________________You have been given 2 Rx for Percocet to last until 5/27/2019_____________  Medication Rules  Applies to: All patients receiving prescriptions (written or electronic).  Pharmacy of record: Pharmacy where electronic prescriptions will be sent. If written prescriptions are taken to a different pharmacy, please inform the nursing staff. The pharmacy listed in the electronic medical record should be the one where you would like electronic prescriptions to be sent.  Prescription refills: Only during scheduled appointments. Applies to both, written and electronic prescriptions.  NOTE: The following applies primarily to controlled substances (Opioid* Pain Medications).   Patient's responsibilities: 1. Pain Pills: Bring all pain pills to every appointment (except for procedure appointments). 2. Pill Bottles: Bring pills in original pharmacy bottle. Always bring newest bottle. Bring bottle, even if empty. 3. Medication refills: You are responsible for knowing and keeping track of what medications you need refilled. The day before your appointment, write a list of all prescriptions that need to be refilled. Bring that list to your appointment and give it to the admitting nurse. Prescriptions will be written only during appointments. If you forget a medication, it will not be "Called in", "Faxed", or "electronically sent". You will need to get another appointment to get these prescribed. 4. Prescription Accuracy: You are responsible for carefully inspecting your prescriptions before leaving our office. Have the discharge nurse carefully go over each prescription with you, before taking them home. Make sure that your name is accurately spelled, that your address is correct. Check the name and dose of your medication to make sure it is accurate. Check the number of pills, and the written instructions to make sure they are clear and  accurate. Make sure that you are given enough medication to last until your next medication refill appointment. 5. Taking Medication: Take medication as prescribed. Never take more pills than instructed. Never take medication more frequently than prescribed. Taking less pills or less frequently is permitted and encouraged, when it comes to controlled substances (written prescriptions).  6. Inform other Doctors: Always inform, all of your healthcare providers, of all the medications you take. 7. Pain Medication from other Providers: You are not allowed to accept any additional pain medication from any other Doctor or Healthcare provider. There are two exceptions to this rule. (see below) In the event that you require additional pain medication, you are responsible for notifying us, as stated below. 8. Medication Agreement: You are responsible for carefully reading and following our Medication Agreement. This must be signed before receiving any prescriptions from our practice. Safely store a copy of your signed Agreement. Violations to the Agreement will result in no further prescriptions. (Additional copies of our Medication Agreement are available upon request.) 9. Laws, Rules, & Regulations: All patients are expected to follow all Federal and [employer], [employer], Rules, [employer]. Ignorance of the Laws does not constitute a valid excuse. The use of any illegal substances is prohibited. 10. Adopted CDC guidelines & recommendations: Target dosing levels will be at or below 60 MME/day. Use of benzodiazepines** is not recommended.  Exceptions: There are only two exceptions to the rule of not receiving pain medications from other Healthcare Providers. 1. Exception #1 (Emergencies): In the event of an emergency (i.e.: accident requiring emergency care), you are allowed to receive additional pain medication. However, you are responsible for: As soon as you are able, call our office [phone], at any  time of the day or night, and leave a message stating your name, the date and nature  of the emergency, and the name and dose of the medication prescribed. In the event that your call is answered by a member of our staff, make sure to document and save the date, time, and the name of the person that took your information.  2. Exception #2 (Planned Surgery): In the event that you are scheduled by another doctor or dentist to have any type of surgery or procedure, you are allowed (for a period no longer than 30 days), to receive additional pain medication, for the acute post-op pain. However, in this case, you are responsible for picking up a copy of our "Post-op Pain Management for Surgeons" handout, and giving it to your surgeon or dentist. This document is available at our office, and does not require an appointment to obtain it. Simply go to our office during business hours (Monday-Thursday from 8:00 AM to 4:00 PM) (Friday 8:00 AM to 12:00 Noon) or if you have a scheduled appointment with [place], prior to your surgery, and ask for it by name. In addition, you will need to provide [place] with your name, name of your surgeon, type of surgery, and date of procedure or surgery.  *Opioid medications include: morphine, codeine, oxycodone, oxymorphone, hydrocodone, hydromorphone, meperidine, tramadol, tapentadol, buprenorphine, fentanyl, methadone. **Benzodiazepine medications include: diazepam (Valium), alprazolam (Xanax), clonazepam (Klonopine), lorazepam (Ativan), clorazepate (Tranxene), chlordiazepoxide (Librium), estazolam (Prosom), oxazepam (Serax), temazepam (Restoril), triazolam (Halcion) ____________________________________________________________________________________________  BMI Assessment: Estimated body mass index is 34.9 kg/m as calculated from the following:   Height as of this encounter: 5\' 3"  (1.6 m).   Weight as of this encounter: 197 lb (89.4 kg).  BMI interpretation table: BMI level Category  Range association with higher incidence of chronic pain  <18 kg/m2 Underweight   18.5-24.9 kg/m2 Ideal body weight   25-29.9 kg/m2 Overweight Increased incidence by 20%  30-34.9 kg/m2 Obese (Class I) Increased incidence by 68%  35-39.9 kg/m2 Severe obesity (Class II) Increased incidence by 136%  >40 kg/m2 Extreme obesity (Class III) Increased incidence by 254%   BMI Readings from Last 4 Encounters:  03/27/17 34.90 kg/m  01/23/17 34.90 kg/m  12/03/16 37.31 kg/m  11/21/16 36.14 kg/m   Wt Readings from Last 4 Encounters:  03/27/17 197 lb (89.4 kg)  01/23/17 197 lb (89.4 kg)  11/21/16 204 lb (92.5 kg)  10/22/16 211 lb (95.7 kg)

## 2017-03-27 NOTE — Progress Notes (Signed)
Nursing Pain Medication Assessment:  Safety precautions to be maintained throughout the outpatient stay will include: orient to surroundings, keep bed in low position, maintain call bell within reach at all times, provide assistance with transfer out of bed and ambulation.  Medication Inspection Compliance: Pill count conducted under aseptic conditions, in front of the patient. Neither the pills nor the bottle was removed from the patient's sight at any time. Once count was completed pills were immediately returned to the patient in their original bottle.  Medication: Oxycodone/APAP Pill/Patch Count: 113 of 120 pills remain Pill/Patch Appearance: Markings consistent with prescribed medication Bottle Appearance: Standard pharmacy container. Clearly labeled. Filled Date: 02 / 26 / 2019 Last Medication intake:  Today

## 2017-04-01 LAB — TOXASSURE SELECT 13 (MW), URINE

## 2017-05-22 ENCOUNTER — Encounter: Payer: Self-pay | Admitting: Nurse Practitioner

## 2017-05-22 ENCOUNTER — Ambulatory Visit: Payer: Medicare Other | Attending: Nurse Practitioner | Admitting: Nurse Practitioner

## 2017-05-22 ENCOUNTER — Other Ambulatory Visit: Payer: Self-pay

## 2017-05-22 VITALS — BP 164/71 | HR 78 | Temp 98.2°F | Resp 16 | Ht 63.0 in | Wt 197.0 lb

## 2017-05-22 DIAGNOSIS — M059 Rheumatoid arthritis with rheumatoid factor, unspecified: Secondary | ICD-10-CM | POA: Insufficient documentation

## 2017-05-22 DIAGNOSIS — I214 Non-ST elevation (NSTEMI) myocardial infarction: Secondary | ICD-10-CM | POA: Insufficient documentation

## 2017-05-22 DIAGNOSIS — M545 Low back pain: Secondary | ICD-10-CM | POA: Insufficient documentation

## 2017-05-22 DIAGNOSIS — E785 Hyperlipidemia, unspecified: Secondary | ICD-10-CM | POA: Insufficient documentation

## 2017-05-22 DIAGNOSIS — M47816 Spondylosis without myelopathy or radiculopathy, lumbar region: Secondary | ICD-10-CM | POA: Diagnosis not present

## 2017-05-22 DIAGNOSIS — M0579 Rheumatoid arthritis with rheumatoid factor of multiple sites without organ or systems involvement: Secondary | ICD-10-CM

## 2017-05-22 DIAGNOSIS — Z955 Presence of coronary angioplasty implant and graft: Secondary | ICD-10-CM | POA: Insufficient documentation

## 2017-05-22 DIAGNOSIS — Z79891 Long term (current) use of opiate analgesic: Secondary | ICD-10-CM | POA: Diagnosis not present

## 2017-05-22 DIAGNOSIS — L732 Hidradenitis suppurativa: Secondary | ICD-10-CM | POA: Diagnosis not present

## 2017-05-22 DIAGNOSIS — I10 Essential (primary) hypertension: Secondary | ICD-10-CM | POA: Insufficient documentation

## 2017-05-22 DIAGNOSIS — G894 Chronic pain syndrome: Secondary | ICD-10-CM | POA: Diagnosis not present

## 2017-05-22 DIAGNOSIS — I2510 Atherosclerotic heart disease of native coronary artery without angina pectoris: Secondary | ICD-10-CM | POA: Insufficient documentation

## 2017-05-22 MED ORDER — OXYCODONE-ACETAMINOPHEN 10-325 MG PO TABS: 1.0000 | ORAL_TABLET | Freq: Four times a day (QID) | ORAL | 0 refills | Status: DC | PRN

## 2017-05-22 NOTE — Progress Notes (Addendum)
Patient's Name: Karen Cervantes  MRN: [id number]  Referring Provider: Denton Lank, MD  DOB: 03-Jan-1956  PCP: Denton Lank, MD  DOS: 05/22/2017  Note by: Vevelyn Francois NP  Service setting: Ambulatory outpatient  Specialty: Interventional Pain Management  Location: [facility] (AMB) Pain Management Facility    Patient type: Established    Primary Reason(s) for Visit: Encounter for prescription drug management. (Level of risk: moderate)  CC: Back Pain (lower, right)  HPI  Karen Cervantes is a 62 y.o. year old, female patient, who comes today for a medication management evaluation. She has NSTEMI (non-ST elevated myocardial infarction) ([place]); CAD (coronary artery disease); Dyslipidemia, goal LDL below 70; Morbid obesity due to excess calories ([place]); Seropositive rheumatoid arthritis of multiple sites ([place]); Diastolic dysfunction; HTN (hypertension), benign; Ischemic chest pain; S/P coronary artery stent placement; Hidradenitis suppurativa; Ischemic heart disease due to coronary artery obstruction ([place]); Lumbar spondylosis; Pain medication agreement signed; Right cervical radiculopathy; Long term current use of opiate analgesic; and Chronic pain syndrome on their problem list. Her primarily concern today is the Back Pain (lower, right)  Pain Assessment: Location: Right, Lower Back Radiating: radiates through right hip down back of right leg then around to front of leg down to foot. Onset: More than a month ago Duration: Chronic pain Quality: Spasm, Constant, Discomfort, Sharp, Numbness Severity: 9 /10 (self-reported pain score)  Note: Reported level is compatible with observation. Clinically the patient looks like a 2/10 A 2/10 is viewed as "Mild to Moderate" and described as noticeable and distracting. Impossible to hide from other people. More frequent flare-ups. Still possible to adapt and function close to normal. It can be very annoying and may have occasional stronger flare-ups. With discipline, patients  may get used to it and adapt. Information on the proper use of the pain scale provided to the patient today. When using our objective Pain Scale, levels between 6 and 10/10 are said to belong in an emergency room, as it progressively worsens from a 6/10, described as severely limiting, requiring emergency care not usually available at an outpatient pain management facility. At a 6/10 level, communication becomes difficult and requires great effort. Assistance to reach the emergency department may be required. Facial flushing and profuse sweating along with potentially dangerous increases in heart rate and blood pressure will be evident. Effect on ADL: prevents pt from being able to sleep through the night Timing: Constant Modifying factors: moist heat, rest, medications  Karen Cervantes was last scheduled for an appointment on 03/27/2017 for medication management. During today's appointment we reviewed Karen Cervantes's chronic pain status, as well as her outpatient medication regimen. She states that it is just mostly shooting pain. She denies any numbness or tingling. She admits that her "sciatica kicked in last night". She denies any weakness in her legs. She admits that she is using CBD oil under her tongue. She is not currently on any RA medication . She is not interested in any injections. She asked about stem cell treatment.   The patient  reports that she does not use drugs. Her body mass index is 34.9 kg/m.  Further details on both, my assessment(s), as well as the proposed treatment plan, please see below.  Controlled Substance Pharmacotherapy Assessment REMS (Risk Evaluation and Mitigation Strategy)  Analgesic: Oxycodone/acetaminophen 10/325 mg 4 times daily oxycodone 40 mg per day MME/day: 60 mg/day.    [name], RN  05/22/2017 10:46 AM  Signed Nursing Pain Medication Assessment:  Safety precautions to be maintained throughout  the outpatient stay will include: orient to surroundings, keep  bed in low position, maintain call bell within reach at all times, provide assistance with transfer out of bed and ambulation.  Medication Inspection Compliance: Karen Cervantes did not comply with our request to bring her pills to be counted. She was reminded that bringing the medication bottles, even when empty, is a requirement.  Medication: None brought in. Pill/Patch Count: None available to be counted. Bottle Appearance: No container available. Did not bring bottle(s) to appointment. Filled Date: N/A Last Medication intake:  Yesterday   Pharmacokinetics: Liberation and absorption (onset of action): WNL Distribution (time to peak effect): WNL Metabolism and excretion (duration of action): WNL         Pharmacodynamics: Desired effects: Analgesia: Karen Cervantes reports >50% benefit. Functional ability: Patient reports that medication allows her to accomplish basic ADLs Clinically meaningful improvement in function (CMIF): Sustained CMIF goals met Perceived effectiveness: Described as relatively effective, allowing for increase in activities of daily living (ADL) Undesirable effects: Side-effects or Adverse reactions: None reported Monitoring: [place] PMP: Online review of the past 61-monthperiod conducted. Compliant with practice rules and regulations Last UDS on record: Summary  Date Value Ref Range Status  05/22/2017 FINAL  Final    Comment:    ==================================================================== TOXASSURE SELECT 13 (MW) ==================================================================== Test                             Result       Flag       Units Drug Present not Declared for Prescription Verification   Tramadol                       145          UNEXPECTED ng/mg creat   O-Desmethyltramadol            120          UNEXPECTED ng/mg creat    Source of tramadol is a prescription medication.    O-desmethyltramadol is an expected metabolite of tramadol. Drug Absent but  Declared for Prescription Verification   Oxycodone                      Not Detected UNEXPECTED ng/mg creat ==================================================================== Test                      Result    Flag   Units      Ref Range   Creatinine              69               mg/dL      >=20 ==================================================================== Declared Medications:  The flagging and interpretation on this report are based on the  following declared medications.  Unexpected results may arise from  inaccuracies in the declared medications.  **Note: The testing scope of this panel includes these medications:  Oxycodone (Percocet)  **Note: The testing scope of this panel does not include following  reported medications:  Acetaminophen (Percocet)  Aluminum (Maalox)  Aspirin (Aspirin 81)  Atorvastatin (Lipitor)  Carvedilol (Coreg)  Clopidogrel (Plavix)  Cyclobenzaprine (Flexeril)  Ezetimibe (Zetia)  Gabapentin (Neurontin)  Lidocaine (Xylocaine)  Lisinopril (Prinivil)  Magnesium (Maalox)  Melatonin  Nitroglycerin (Nitrostat)  Omeprazole (Prilosec) ==================================================================== For clinical consultation, please call ([phone] ====================================================================    UDS interpretation: Compliant  Medication Assessment Form: Reviewed. Patient indicates being compliant with therapy Treatment compliance: Compliant Risk Assessment Profile: Aberrant behavior: See prior evaluations. None observed or detected today Comorbid factors increasing risk of overdose: See prior notes. No additional risks detected today Risk of substance use disorder (SUD): Low Opioid Risk Tool - 05/22/17 1001      Family History of Substance Abuse   Alcohol  Negative    Illegal Drugs  Negative    Rx Drugs  Negative      Personal History of Substance Abuse   Alcohol  Negative    Illegal Drugs  Negative     Rx Drugs  Negative      Age   Age between 16-45 years   No      Psychological Disease   Psychological Disease  Negative    Depression  Negative      Total Score   Opioid Risk Tool Scoring  0    Opioid Risk Interpretation  Low Risk      ORT Scoring interpretation table:  Score <3 = Low Risk for SUD  Score between 4-7 = Moderate Risk for SUD  Score >8 = High Risk for Opioid Abuse   Risk Mitigation Strategies:  Patient Counseling: Covered Patient-Prescriber Agreement (PPA): Present and active  Notification to other healthcare providers: Done  Pharmacologic Plan: No change in therapy, at this time.             Laboratory Chemistry  Inflammation Markers (CRP: Acute Phase) (ESR: Chronic Phase) No results found for: CRP, ESRSEDRATE, LATICACIDVEN                       Rheumatology Markers No results found for: RF, ANA, Therisa D[place], [facility]                      Renal Function Markers Lab Results  Component Value Date   BUN 7 11/21/2016   CREATININE 0.64 11/21/2016   GFRAA >60 11/21/2016   GFRNONAA >60 11/21/2016                              Hepatic Function Markers Lab Results  Component Value Date   AST 22 11/21/2016   ALT 18 11/21/2016   ALBUMIN 4.2 11/21/2016   ALKPHOS 78 11/21/2016   LIPASE 21 11/21/2016                        Electrolytes Lab Results  Component Value Date   NA 141 11/21/2016   K 3.7 11/21/2016   CL 105 11/21/2016   CALCIUM 9.4 11/21/2016                        Neuropathy Markers Lab Results  Component Value Date   HGBA1C 6.3 (H) 01/02/2015                        Bone Pathology Markers No results found for: VD25OH, [id number], ES9753YY5, RT0211ZN3, 25OHVITD1, 25OHVITD2, 25OHVITD3, TESTOFREE, TESTOSTERONE                       Coagulation Parameters Lab Results  Component Value Date   INR 1.02 01/02/2015   LABPROT 13.6 01/02/2015   APTT 35 01/02/2015   PLT 349 11/21/2016  Cardiovascular Markers Lab Results  Component Value Date   CKMB 5.3 (H) 05/28/2014   TROPONINI <0.03 11/21/2016   HGB 12.4 11/21/2016   HCT 36.0 11/21/2016                         CA Markers No results found for: CEA, CA125, LABCA2                      Note: Lab results reviewed.  Recent Diagnostic Imaging Results  MM SCREENING BREAST TOMO BILATERAL CLINICAL DATA:  Screening.  EXAM: 2D DIGITAL SCREENING BILATERAL MAMMOGRAM WITH 3D TOMO WITH CAD  COMPARISON:  Previous exam(s).  ACR Breast Density Category b: There are scattered areas of fibroglandular density.  FINDINGS: There are no findings suspicious for malignancy. Images were processed with CAD.  IMPRESSION: No mammographic evidence of malignancy. A result letter of this screening mammogram will be mailed directly to the patient.  RECOMMENDATION: Screening mammogram in one year. (Code:[id number])  BI-RADS CATEGORY  1: Negative.  Electronically Signed   By: Abelardo Diesel M.D.   On: 02/21/2017 13:28  Complexity Note: Imaging results reviewed. Results shared with Ms. Eulas Post, using Layman's terms.                         Meds   Current Outpatient Medications:  .  alum & mag hydroxide-simeth (MAALOX MAX) 400-400-40 MG/5ML suspension, Take 5 mLs by mouth every 6 (six) hours as needed for indigestion., Disp: 355 mL, Rfl: 0 .  aspirin EC 81 MG tablet, Take 1 tablet (81 mg total) by mouth daily., Disp: 30 tablet, Rfl: 0 .  atorvastatin (LIPITOR) 80 MG tablet, Take 80 mg by mouth every evening., Disp: , Rfl:  .  carvedilol (COREG) 12.5 MG tablet, Take 1 tablet (12.5 mg total) by mouth 2 (two) times daily with a meal., Disp: 30 tablet, Rfl: 0 .  clopidogrel (PLAVIX) 75 MG tablet, Take 1 tablet (75 mg total) by mouth daily., Disp: 30 tablet, Rfl: 0 .  cyclobenzaprine (FLEXERIL) 10 MG tablet, Take 10 mg by mouth at bedtime., Disp: , Rfl:  .  ezetimibe (ZETIA) 10 MG tablet, Take 10 mg by mouth daily., Disp: , Rfl:  .   FOLIC ACID PO, Take 1 tablet by mouth every morning. , Disp: , Rfl:  .  gabapentin (NEURONTIN) 300 MG capsule, Take 600 mg by mouth 2 (two) times daily. , Disp: , Rfl:  .  lidocaine (XYLOCAINE) 2 % solution, Use as directed 20 mLs in the mouth or throat as needed for mouth pain., Disp: 100 mL, Rfl: 0 .  lisinopril (PRINIVIL,ZESTRIL) 10 MG tablet, Take 1 tablet (10 mg total) by mouth daily., Disp: 30 tablet, Rfl: 0 .  Melatonin 5 MG CAPS, Take 10-30 mg by mouth at bedtime. , Disp: , Rfl:  .  Multiple Vitamins-Minerals (MULTIVITAMIN PO), Take 1 tablet by mouth every morning., Disp: , Rfl:  .  nitroGLYCERIN (NITROSTAT) 0.4 MG SL tablet, Place 1 tablet (0.4 mg total) under the tongue every 5 (five) minutes as needed for chest pain., Disp: 25 tablet, Rfl: 3 .  omeprazole (PRILOSEC) 20 MG capsule, Take 20 mg by mouth 2 (two) times daily before a meal., Disp: , Rfl:  .  [START ON 06/23/2017] oxyCODONE-acetaminophen (PERCOCET) 10-325 MG tablet, Take 1 tablet by mouth every 6 (six) hours as needed for pain., Disp: 120 tablet, Rfl: 0  ROS  Constitutional:  Denies any fever or chills Gastrointestinal: No reported hemesis, hematochezia, vomiting, or acute GI distress Musculoskeletal: Denies any acute onset joint swelling, redness, loss of ROM, or weakness Neurological: No reported episodes of acute onset apraxia, aphasia, dysarthria, agnosia, amnesia, paralysis, loss of coordination, or loss of consciousness  Allergies  Ms. Eberlin is allergic to remicade [infliximab].  [place]  Drug: Ms. Nickle  reports that she does not use drugs. Alcohol:  reports that she does not drink alcohol. Tobacco:  reports that she has quit smoking. She has never used smokeless tobacco. Medical:  has a past medical history of CAD (coronary artery disease), Diastolic dysfunction, Gastric ulcer, HLD (hyperlipidemia), Hypertension, Myocardial infarction [facility]) (may 2016, December 2016), Obesity, and RA (rheumatoid arthritis)  ([place]). Surgical: Ms. Blanks  has a past surgical history that includes Cardiac catheterization (N/A, 05/30/2014); Cardiac catheterization (N/A, 05/30/2014); Cardiac catheterization (N/A, 01/02/2015); and Cardiac catheterization (N/A, 01/02/2015). Family: family history includes Heart Problems in her brother, brother, and brother; Heart attack in her father; Heart disease in her father.  Constitutional Exam  General appearance: Well nourished, well developed, and well hydrated. In no apparent acute distress Vitals:   05/22/17 0949  BP: (!) 164/71  Pulse: 78  Resp: 16  Temp: 98.2 F (36.8 C)  TempSrc: Oral  SpO2: 100%  Weight: 197 lb (89.4 kg)  Height: '5\' 3"'  (1.6 m)  Psych/Mental status: Alert, oriented x 3 (person, place, & time)       Eyes: PERLA Respiratory: No evidence of acute respiratory distress  Cervical Spine Area Exam  Skin & Axial Inspection: No masses, redness, edema, swelling, or associated skin lesions Alignment: Symmetrical Functional ROM: Unrestricted ROM      Stability: No instability detected Muscle Tone/Strength: Functionally intact. No obvious neuro-muscular anomalies detected. Sensory (Neurological): Unimpaired Palpation: No palpable anomalies              Upper Extremity (UE) Exam    Side: Right upper extremity  Side: Left upper extremity  Skin & Extremity Inspection: Skin color, temperature, and hair growth are WNL. No peripheral edema or cyanosis. No masses, redness, swelling, asymmetry, or associated skin lesions. No contractures.  Skin & Extremity Inspection: Skin color, temperature, and hair growth are WNL. No peripheral edema or cyanosis. No masses, redness, swelling, asymmetry, or associated skin lesions. No contractures.  Functional ROM: Unrestricted ROM          Functional ROM: Unrestricted ROM          Muscle Tone/Strength: Functionally intact. No obvious neuro-muscular anomalies detected.  Muscle Tone/Strength: Functionally intact. No obvious neuro-muscular  anomalies detected.  Sensory (Neurological): Unimpaired          Sensory (Neurological): Unimpaired          Palpation: No palpable anomalies              Palpation: No palpable anomalies              Specialized Test(s): Deferred         Specialized Test(s): Deferred          Thoracic Spine Area Exam  Skin & Axial Inspection: No masses, redness, or swelling Alignment: Symmetrical Functional ROM: Unrestricted ROM Stability: No instability detected Muscle Tone/Strength: Functionally intact. No obvious neuro-muscular anomalies detected. Sensory (Neurological): Unimpaired Muscle strength & Tone: No palpable anomalies  Lumbar Spine Area Exam  Skin & Axial Inspection: No masses, redness, or swelling Alignment: Symmetrical Functional ROM: Unrestricted ROM       Stability: No instability  detected Muscle Tone/Strength: Functionally intact. No obvious neuro-muscular anomalies detected. Sensory (Neurological): Unimpaired Palpation: Complains of area being tender to palpation       Provocative Tests: Lumbar Hyperextension and rotation test: Positive bilaterally for facet joint pain. Lumbar Lateral bending test: evaluation deferred today       Patrick's Maneuver: evaluation deferred today                    Gait & Posture Assessment  Ambulation: Unassisted Gait: Relatively normal for age and body habitus Posture: WNL   Lower Extremity Exam    Side: Right lower extremity  Side: Left lower extremity  Skin & Extremity Inspection: Skin color, temperature, and hair growth are WNL. No peripheral edema or cyanosis. No masses, redness, swelling, asymmetry, or associated skin lesions. No contractures.  Skin & Extremity Inspection: Skin color, temperature, and hair growth are WNL. No peripheral edema or cyanosis. No masses, redness, swelling, asymmetry, or associated skin lesions. No contractures.  Functional ROM: Unrestricted ROM          Functional ROM: Unrestricted ROM          Muscle Tone/Strength:  Functionally intact. No obvious neuro-muscular anomalies detected.  Muscle Tone/Strength: Functionally intact. No obvious neuro-muscular anomalies detected.  Sensory (Neurological): Unimpaired  Sensory (Neurological): Unimpaired  Palpation: No palpable anomalies  Palpation: No palpable anomalies   Assessment  Primary Diagnosis & Pertinent Problem List: The primary encounter diagnosis was Lumbar spondylosis. Diagnoses of Seropositive rheumatoid arthritis of multiple sites ([place]), Hidradenitis suppurativa, Chronic pain syndrome, and Long term prescription opiate use were also pertinent to this visit.  Status Diagnosis  Persistent Persistent Controlled 1. Lumbar spondylosis   2. Seropositive rheumatoid arthritis of multiple sites ([place])   3. Hidradenitis suppurativa   4. Chronic pain syndrome   5. Long term prescription opiate use     Problems updated and reviewed during this visit: No problems updated. Plan of Care  Pharmacotherapy (Medications Ordered): Meds ordered this encounter  Medications  . oxyCODONE-acetaminophen (PERCOCET) 10-325 MG tablet    Sig: Take 1 tablet by mouth every 6 (six) hours as needed for pain.    Dispense:  120 tablet    Refill:  0    For Chronic Pain DNF:  06/23/2017 To last until 07/24/2017    Order Specific Question:   Supervising Provider    Answer:   Milinda Pointer [phone]   New Prescriptions   No medications on file   Medications administered today: Payge L. Novell had no medications administered during this visit. Lab-work, procedure(s), and/or referral(s): Orders Placed This Encounter  Procedures  . ToxASSURE Select 13 (MW), Urine   Imaging and/or referral(s): None  Interventional therapies:  Considering:  -Genicular nerve blocks bilaterally  -Lumbar medial branch block with goal radiofrequency ablation -Intra-articular knee steroid injections. -SI joint injections bilaterally    Provider-requested follow-up: Return in about 2  months (around 07/22/2017) for w/ Dr. Holley Raring.  Future Appointments  Date Time Provider [place]  07/17/2017 10:00 AM Gillis Santa, MD [facility] None   Primary Care Physician: Denton Lank, MD Location: [facility] Note by: Vevelyn Francois NP Date: 05/22/2017; Time: 1:33 PM  Pain Score Disclaimer: We use the NRS-11 scale. This is a self-reported, subjective measurement of pain severity with only modest accuracy. It is used primarily to identify changes within a particular patient. It must be understood that outpatient pain scales are significantly less accurate that those used for research, where they can  be applied under ideal controlled circumstances with minimal exposure to variables. In reality, the score is likely to be a combination of pain intensity and pain affect, where pain affect describes the degree of emotional arousal or changes in action readiness caused by the sensory experience of pain. Factors such as social and work situation, setting, emotional state, anxiety levels, expectation, and prior pain experience may influence pain perception and show large inter-individual differences that may also be affected by time variables.  Patient instructions provided during this appointment: Patient Instructions   ____________________________________________________________________________________________  Medication Rules  Applies to: All patients receiving prescriptions (written or electronic).  Pharmacy of record: Pharmacy where electronic prescriptions will be sent. If written prescriptions are taken to a different pharmacy, please inform the nursing staff. The pharmacy listed in the electronic medical record should be the one where you would like electronic prescriptions to be sent.  Prescription refills: Only during scheduled appointments. Applies to both, written and electronic prescriptions.  NOTE: The following applies primarily to  controlled substances (Opioid* Pain Medications).   Patient's responsibilities: 1. Pain Pills: Bring all pain pills to every appointment (except for procedure appointments). 2. Pill Bottles: Bring pills in original pharmacy bottle. Always bring newest bottle. Bring bottle, even if empty. 3. Medication refills: You are responsible for knowing and keeping track of what medications you need refilled. The day before your appointment, write a list of all prescriptions that need to be refilled. Bring that list to your appointment and give it to the admitting nurse. Prescriptions will be written only during appointments. If you forget a medication, it will not be "Called in", "Faxed", or "electronically sent". You will need to get another appointment to get these prescribed. 4. Prescription Accuracy: You are responsible for carefully inspecting your prescriptions before leaving our office. Have the discharge nurse carefully go over each prescription with you, before taking them home. Make sure that your name is accurately spelled, that your address is correct. Check the name and dose of your medication to make sure it is accurate. Check the number of pills, and the written instructions to make sure they are clear and accurate. Make sure that you are given enough medication to last until your next medication refill appointment. 5. Taking Medication: Take medication as prescribed. Never take more pills than instructed. Never take medication more frequently than prescribed. Taking less pills or less frequently is permitted and encouraged, when it comes to controlled substances (written prescriptions).  6. Inform other Doctors: Always inform, all of your healthcare providers, of all the medications you take. 7. Pain Medication from other Providers: You are not allowed to accept any additional pain medication from any other Doctor or Healthcare provider. There are two exceptions to this rule. (see below) In the event  that you require additional pain medication, you are responsible for notifying us, as stated below. 8. Medication Agreement: You are responsible for carefully reading and following our Medication Agreement. This must be signed before receiving any prescriptions from our practice. Safely store a copy of your signed Agreement. Violations to the Agreement will result in no further prescriptions. (Additional copies of our Medication Agreement are available upon request.) 9. Laws, Rules, & Regulations: All patients are expected to follow all Federal and [employer], [employer], Rules, [employer]. Ignorance of the Laws does not constitute a valid excuse. The use of any illegal substances is prohibited. 10. Adopted CDC guidelines & recommendations: Target dosing levels will be at or below 60 MME/day. Use  of benzodiazepines** is not recommended.  Exceptions: There are only two exceptions to the rule of not receiving pain medications from other Healthcare Providers. 1. Exception #1 (Emergencies): In the event of an emergency (i.e.: accident requiring emergency care), you are allowed to receive additional pain medication. However, you are responsible for: As soon as you are able, call our office [phone], at any time of the day or night, and leave a message stating your name, the date and nature of the emergency, and the name and dose of the medication prescribed. In the event that your call is answered by a member of our staff, make sure to document and save the date, time, and the name of the person that took your information.  2. Exception #2 (Planned Surgery): In the event that you are scheduled by another doctor or dentist to have any type of surgery or procedure, you are allowed (for a period no longer than 30 days), to receive additional pain medication, for the acute post-op pain. However, in this case, you are responsible for picking up a copy of our "Post-op Pain Management for Surgeons" handout, and  giving it to your surgeon or dentist. This document is available at our office, and does not require an appointment to obtain it. Simply go to our office during business hours (Monday-Thursday from 8:00 AM to 4:00 PM) (Friday 8:00 AM to 12:00 Noon) or if you have a scheduled appointment with [place], prior to your surgery, and ask for it by name. In addition, you will need to provide [place] with your name, name of your surgeon, type of surgery, and date of procedure or surgery.  *Opioid medications include: morphine, codeine, oxycodone, oxymorphone, hydrocodone, hydromorphone, meperidine, tramadol, tapentadol, buprenorphine, fentanyl, methadone. **Benzodiazepine medications include: diazepam (Valium), alprazolam (Xanax), clonazepam (Klonopine), lorazepam (Ativan), clorazepate (Tranxene), chlordiazepoxide (Librium), estazolam (Prosom), oxazepam (Serax), temazepam (Restoril), triazolam (Halcion) (Last updated: 03/27/2017) ____________________________________________________________________________________________   ____________________________________________________________________________________________  Pain Scale  Introduction: The pain score used by this practice is the Verbal Numerical Rating Scale (VNRS-11). This is an 11-point scale. It is for adults and children 10 years or older. There are significant differences in how the pain score is reported, used, and applied. Forget everything you learned in the past and learn this scoring system.  General Information: The scale should reflect your current level of pain. Unless you are specifically asked for the level of your worst pain, or your average pain. If you are asked for one of these two, then it should be understood that it is over the past 24 hours.  Basic Activities of Daily Living (ADL): Personal hygiene, dressing, eating, transferring, and using restroom.  Instructions: Most patients tend to report their level of pain as a combination of two  factors, their physical pain and their psychosocial pain. This last one is also known as "suffering" and it is reflection of how physical pain affects you socially and psychologically. From now on, report them separately. From this point on, when asked to report your pain level, report only your physical pain. Use the following table for reference.  Pain Clinic Pain Levels (0-5/10)  Pain Level Score  Description  No Pain 0   Mild pain 1 Nagging, annoying, but does not interfere with basic activities of daily living (ADL). Patients are able to eat, bathe, get dressed, toileting (being able to get on and off the toilet and perform personal hygiene functions), transfer (move in and out of bed or a chair without assistance), and maintain continence (  able to control bladder and bowel functions). Blood pressure and heart rate are unaffected. A normal heart rate for a healthy adult ranges from 60 to 100 bpm (beats per minute).   Mild to moderate pain 2 Noticeable and distracting. Impossible to hide from other people. More frequent flare-ups. Still possible to adapt and function close to normal. It can be very annoying and may have occasional stronger flare-ups. With discipline, patients may get used to it and adapt.   Moderate pain 3 Interferes significantly with activities of daily living (ADL). It becomes difficult to feed, bathe, get dressed, get on and off the toilet or to perform personal hygiene functions. Difficult to get in and out of bed or a chair without assistance. Very distracting. With effort, it can be ignored when deeply involved in activities.   Moderately severe pain 4 Impossible to ignore for more than a few minutes. With effort, patients may still be able to manage work or participate in some social activities. Very difficult to concentrate. Signs of autonomic nervous system discharge are evident: dilated pupils (mydriasis); mild sweating (diaphoresis); sleep interference. Heart rate becomes  elevated (>115 bpm). Diastolic blood pressure (lower number) rises above 100 mmHg. Patients find relief in laying down and not moving.   Severe pain 5 Intense and extremely unpleasant. Associated with frowning face and frequent crying. Pain overwhelms the senses.  Ability to do any activity or maintain social relationships becomes significantly limited. Conversation becomes difficult. Pacing back and forth is common, as getting into a comfortable position is nearly impossible. Pain wakes you up from deep sleep. Physical signs will be obvious: pupillary dilation; increased sweating; goosebumps; brisk reflexes; cold, clammy hands and feet; nausea, vomiting or dry heaves; loss of appetite; significant sleep disturbance with inability to fall asleep or to remain asleep. When persistent, significant weight loss is observed due to the complete loss of appetite and sleep deprivation.  Blood pressure and heart rate becomes significantly elevated. Caution: If elevated blood pressure triggers a pounding headache associated with blurred vision, then the patient should immediately seek attention at an urgent or emergency care unit, as these may be signs of an impending stroke.    Emergency Department Pain Levels (6-10/10)  Emergency Room Pain 6 Severely limiting. Requires emergency care and should not be seen or managed at an outpatient pain management facility. Communication becomes difficult and requires great effort. Assistance to reach the emergency department may be required. Facial flushing and profuse sweating along with potentially dangerous increases in heart rate and blood pressure will be evident.   Distressing pain 7 Self-care is very difficult. Assistance is required to transport, or use restroom. Assistance to reach the emergency department will be required. Tasks requiring coordination, such as bathing and getting dressed become very difficult.   Disabling pain 8 Self-care is no longer possible. At  this level, pain is disabling. The individual is unable to do even the most "basic" activities such as walking, eating, bathing, dressing, transferring to a bed, or toileting. Fine motor skills are lost. It is difficult to think clearly.   Incapacitating pain 9 Pain becomes incapacitating. Thought processing is no longer possible. Difficult to remember your own name. Control of movement and coordination are lost.   The worst pain imaginable 10 At this level, most patients pass out from pain. When this level is reached, collapse of the autonomic nervous system occurs, leading to a sudden drop in blood pressure and heart rate. This in turn results in a temporary  and dramatic drop in blood flow to the brain, leading to a loss of consciousness. Fainting is one of the body's self defense mechanisms. Passing out puts the brain in a calmed state and causes it to shut down for a while, in order to begin the healing process.    Summary: 1. Refer to this scale when providing [place] with your pain level. 2. Be accurate and careful when reporting your pain level. This will help with your care. 3. Over-reporting your pain level will lead to loss of credibility. 4. Even a level of 1/10 means that there is pain and will be treated at our facility. 5. High, inaccurate reporting will be documented as "Symptom Exaggeration", leading to loss of credibility and suspicions of possible secondary gains such as obtaining more narcotics, or wanting to appear disabled, for fraudulent reasons. 6. Only pain levels of 5 or below will be seen at our facility. 7. Pain levels of 6 and above will be sent to the Emergency Department and the appointment cancelled. _____________________________________________________________________________________________________________________________________________________________________  CANNABIDIOL (AKA: CBD Oil or Pills)  Applies to: All patients receiving prescriptions of controlled  substances (written and/or electronic).  General Information: Cannabidiol (CBD) was discovered in [birth year]. It is one of some 113 identified cannabinoids in cannabis (Marijuana) plants, accounting for up to 40% of the plant's extract. As of 2018, preliminary clinical research on cannabidiol included studies of anxiety, cognition, movement disorders, and pain.  Cannabidiol is consummed in multiple ways, including inhalation of cannabis smoke or vapor, as an aerosol spray into the cheek, and by mouth. It may be supplied as CBD oil containing CBD as the active ingredient (no added tetrahydrocannabinol (THC) or terpenes), a full-plant CBD-dominant hemp extract oil, capsules, dried cannabis, or as a liquid solution. CBD is thought not have the same psychoactivity as THC, and may affect the actions of THC. Studies suggest that CBD may interact with different biological targets, including cannabinoid receptors and other neurotransmitter receptors. As of 2018 the mechanism of action for its biological effects has not been determined.  In the [place], cannabidiol has a limited approval by the Food and Drug Administration (FDA) for treatment of only two types of epilepsy disorders. The side effects of long-term use of the drug include somnolence, decreased appetite, diarrhea, fatigue, malaise, weakness, sleeping problems, and others.  CBD remains a Schedule I drug prohibited for any use.  Legality: Some manufacturers ship CBD products nationally, an illegal action which the FDA has not enforced in 2018, with CBD remaining the subject of an FDA investigational new drug evaluation, and is not considered legal as a dietary supplement or food ingredient as of December 2018. Federal illegality has made it difficult historically to conduct research on CBD. CBD is openly sold in head shops and health food stores in some states where such sales have not been explicitly legalized.  Warning: Because it is not FDA approved  for general use or treatment of pain, it is not required to undergo the same manufacturing controls as prescription drugs.  This means that the available cannabidiol (CBD) may be contaminated with THC.  If this is the case, it will trigger a positive urine drug screen (UDS) test for cannabinoids (Marijuana).  Because a positive UDS for illicit substances is a violation of our medication agreement, your opioid analgesics (pain medicine) may be permanently discontinued. (Last update: 04/17/2017) ____________________________________________________________________________________________  ___________________  BMI Assessment: Estimated body mass index is 34.9 kg/m as calculated from the following:   Height as  of this encounter: '5\' 3"'  (1.6 m).   Weight as of this encounter: 197 lb (89.4 kg).  BMI interpretation table: BMI level Category Range association with higher incidence of chronic pain  <18 kg/m2 Underweight   18.5-24.9 kg/m2 Ideal body weight   25-29.9 kg/m2 Overweight Increased incidence by 20%  30-34.9 kg/m2 Obese (Class I) Increased incidence by 68%  35-39.9 kg/m2 Severe obesity (Class II) Increased incidence by 136%  >40 kg/m2 Extreme obesity (Class III) Increased incidence by 254%   Patient's current BMI Ideal Body weight  Body mass index is 34.9 kg/m. Ideal body weight: 52.4 kg (115 lb 8.3 oz) Adjusted ideal body weight: 67.2 kg (148 lb 1.8 oz)   BMI Readings from Last 4 Encounters:  05/22/17 34.90 kg/m  03/27/17 34.90 kg/m  01/23/17 34.90 kg/m  12/03/16 37.31 kg/m   Wt Readings from Last 4 Encounters:  05/22/17 197 lb (89.4 kg)  03/27/17 197 lb (89.4 kg)  01/23/17 197 lb (89.4 kg)  11/21/16 204 lb (92.5 kg)   You were given one prescription for Oxycodone/Acetaminophen today.

## 2017-05-22 NOTE — Progress Notes (Signed)
Nursing Pain Medication Assessment:  Safety precautions to be maintained throughout the outpatient stay will include: orient to surroundings, keep bed in low position, maintain call bell within reach at all times, provide assistance with transfer out of bed and ambulation.  Medication Inspection Compliance: Karen Cervantes did not comply with our request to bring her pills to be counted. She was reminded that bringing the medication bottles, even when empty, is a requirement.  Medication: None brought in. Pill/Patch Count: None available to be counted. Bottle Appearance: No container available. Did not bring bottle(s) to appointment. Filled Date: N/A Last Medication intake:  Yesterday 

## 2017-05-22 NOTE — Patient Instructions (Addendum)
____________________________________________________________________________________________  Medication Rules  Applies to: All patients receiving prescriptions (written or electronic).  Pharmacy of record: Pharmacy where electronic prescriptions will be sent. If written prescriptions are taken to a different pharmacy, please inform the nursing staff. The pharmacy listed in the electronic medical record should be the one where you would like electronic prescriptions to be sent.  Prescription refills: Only during scheduled appointments. Applies to both, written and electronic prescriptions.  NOTE: The following applies primarily to controlled substances (Opioid* Pain Medications).   Patient's responsibilities: 1. Pain Pills: Bring all pain pills to every appointment (except for procedure appointments). 2. Pill Bottles: Bring pills in original pharmacy bottle. Always bring newest bottle. Bring bottle, even if empty. 3. Medication refills: You are responsible for knowing and keeping track of what medications you need refilled. The day before your appointment, write a list of all prescriptions that need to be refilled. Bring that list to your appointment and give it to the admitting nurse. Prescriptions will be written only during appointments. If you forget a medication, it will not be "Called in", "Faxed", or "electronically sent". You will need to get another appointment to get these prescribed. 4. Prescription Accuracy: You are responsible for carefully inspecting your prescriptions before leaving our office. Have the discharge nurse carefully go over each prescription with you, before taking them home. Make sure that your name is accurately spelled, that your address is correct. Check the name and dose of your medication to make sure it is accurate. Check the number of pills, and the written instructions to make sure they are clear and accurate. Make sure that you are given enough medication to last  until your next medication refill appointment. 5. Taking Medication: Take medication as prescribed. Never take more pills than instructed. Never take medication more frequently than prescribed. Taking less pills or less frequently is permitted and encouraged, when it comes to controlled substances (written prescriptions).  6. Inform other Doctors: Always inform, all of your healthcare providers, of all the medications you take. 7. Pain Medication from other Providers: You are not allowed to accept any additional pain medication from any other Doctor or Healthcare provider. There are two exceptions to this rule. (see below) In the event that you require additional pain medication, you are responsible for notifying us, as stated below. 8. Medication Agreement: You are responsible for carefully reading and following our Medication Agreement. This must be signed before receiving any prescriptions from our practice. Safely store a copy of your signed Agreement. Violations to the Agreement will result in no further prescriptions. (Additional copies of our Medication Agreement are available upon request.) 9. Laws, Rules, & Regulations: All patients are expected to follow all Federal and State Laws, Statutes, Rules, & Regulations. Ignorance of the Laws does not constitute a valid excuse. The use of any illegal substances is prohibited. 10. Adopted CDC guidelines & recommendations: Target dosing levels will be at or below 60 MME/day. Use of benzodiazepines** is not recommended.  Exceptions: There are only two exceptions to the rule of not receiving pain medications from other Healthcare Providers. 1. Exception #1 (Emergencies): In the event of an emergency (i.e.: accident requiring emergency care), you are allowed to receive additional pain medication. However, you are responsible for: As soon as you are able, call our office [phone], at any time of the day or night, and leave a message stating your name, the  date and nature of the emergency, and the name and dose of the medication   prescribed. In the event that your call is answered by a member of our staff, make sure to document and save the date, time, and the name of the person that took your information.  2. Exception #2 (Planned Surgery): In the event that you are scheduled by another doctor or dentist to have any type of surgery or procedure, you are allowed (for a period no longer than 30 days), to receive additional pain medication, for the acute post-op pain. However, in this case, you are responsible for picking up a copy of our "Post-op Pain Management for Surgeons" handout, and giving it to your surgeon or dentist. This document is available at our office, and does not require an appointment to obtain it. Simply go to our office during business hours (Monday-Thursday from 8:00 AM to 4:00 PM) (Friday 8:00 AM to 12:00 Noon) or if you have a scheduled appointment with us, prior to your surgery, and ask for it by name. In addition, you will need to provide us with your name, name of your surgeon, type of surgery, and date of procedure or surgery.  *Opioid medications include: morphine, codeine, oxycodone, oxymorphone, hydrocodone, hydromorphone, meperidine, tramadol, tapentadol, buprenorphine, fentanyl, methadone. **Benzodiazepine medications include: diazepam (Valium), alprazolam (Xanax), clonazepam (Klonopine), lorazepam (Ativan), clorazepate (Tranxene), chlordiazepoxide (Librium), estazolam (Prosom), oxazepam (Serax), temazepam (Restoril), triazolam (Halcion) (Last updated: 03/27/2017) ____________________________________________________________________________________________   ____________________________________________________________________________________________  Pain Scale  Introduction: The pain score used by this practice is the Verbal Numerical Rating Scale (VNRS-11). This is an 11-point scale. It is for adults and children 10 years or  older. There are significant differences in how the pain score is reported, used, and applied. Forget everything you learned in the past and learn this scoring system.  General Information: The scale should reflect your current level of pain. Unless you are specifically asked for the level of your worst pain, or your average pain. If you are asked for one of these two, then it should be understood that it is over the past 24 hours.  Basic Activities of Daily Living (ADL): Personal hygiene, dressing, eating, transferring, and using restroom.  Instructions: Most patients tend to report their level of pain as a combination of two factors, their physical pain and their psychosocial pain. This last one is also known as "suffering" and it is reflection of how physical pain affects you socially and psychologically. From now on, report them separately. From this point on, when asked to report your pain level, report only your physical pain. Use the following table for reference.  Pain Clinic Pain Levels (0-5/10)  Pain Level Score  Description  No Pain 0   Mild pain 1 Nagging, annoying, but does not interfere with basic activities of daily living (ADL). Patients are able to eat, bathe, get dressed, toileting (being able to get on and off the toilet and perform personal hygiene functions), transfer (move in and out of bed or a chair without assistance), and maintain continence (able to control bladder and bowel functions). Blood pressure and heart rate are unaffected. A normal heart rate for a healthy adult ranges from 60 to 100 bpm (beats per minute).   Mild to moderate pain 2 Noticeable and distracting. Impossible to hide from other people. More frequent flare-ups. Still possible to adapt and function close to normal. It can be very annoying and may have occasional stronger flare-ups. With discipline, patients may get used to it and adapt.   Moderate pain 3 Interferes significantly with activities of daily  living (ADL).   It becomes difficult to feed, bathe, get dressed, get on and off the toilet or to perform personal hygiene functions. Difficult to get in and out of bed or a chair without assistance. Very distracting. With effort, it can be ignored when deeply involved in activities.   Moderately severe pain 4 Impossible to ignore for more than a few minutes. With effort, patients may still be able to manage work or participate in some social activities. Very difficult to concentrate. Signs of autonomic nervous system discharge are evident: dilated pupils (mydriasis); mild sweating (diaphoresis); sleep interference. Heart rate becomes elevated (>115 bpm). Diastolic blood pressure (lower number) rises above 100 mmHg. Patients find relief in laying down and not moving.   Severe pain 5 Intense and extremely unpleasant. Associated with frowning face and frequent crying. Pain overwhelms the senses.  Ability to do any activity or maintain social relationships becomes significantly limited. Conversation becomes difficult. Pacing back and forth is common, as getting into a comfortable position is nearly impossible. Pain wakes you up from deep sleep. Physical signs will be obvious: pupillary dilation; increased sweating; goosebumps; brisk reflexes; cold, clammy hands and feet; nausea, vomiting or dry heaves; loss of appetite; significant sleep disturbance with inability to fall asleep or to remain asleep. When persistent, significant weight loss is observed due to the complete loss of appetite and sleep deprivation.  Blood pressure and heart rate becomes significantly elevated. Caution: If elevated blood pressure triggers a pounding headache associated with blurred vision, then the patient should immediately seek attention at an urgent or emergency care unit, as these may be signs of an impending stroke.    Emergency Department Pain Levels (6-10/10)  Emergency Room Pain 6 Severely limiting. Requires emergency care  and should not be seen or managed at an outpatient pain management facility. Communication becomes difficult and requires great effort. Assistance to reach the emergency department may be required. Facial flushing and profuse sweating along with potentially dangerous increases in heart rate and blood pressure will be evident.   Distressing pain 7 Self-care is very difficult. Assistance is required to transport, or use restroom. Assistance to reach the emergency department will be required. Tasks requiring coordination, such as bathing and getting dressed become very difficult.   Disabling pain 8 Self-care is no longer possible. At this level, pain is disabling. The individual is unable to do even the most "basic" activities such as walking, eating, bathing, dressing, transferring to a bed, or toileting. Fine motor skills are lost. It is difficult to think clearly.   Incapacitating pain 9 Pain becomes incapacitating. Thought processing is no longer possible. Difficult to remember your own name. Control of movement and coordination are lost.   The worst pain imaginable 10 At this level, most patients pass out from pain. When this level is reached, collapse of the autonomic nervous system occurs, leading to a sudden drop in blood pressure and heart rate. This in turn results in a temporary and dramatic drop in blood flow to the brain, leading to a loss of consciousness. Fainting is one of the body's self defense mechanisms. Passing out puts the brain in a calmed state and causes it to shut down for a while, in order to begin the healing process.    Summary: 1. Refer to this scale when providing us with your pain level. 2. Be accurate and careful when reporting your pain level. This will help with your care. 3. Over-reporting your pain level will lead to loss of credibility. 4. Even   a level of 1/10 means that there is pain and will be treated at our facility. 5. High, inaccurate reporting will be  documented as "Symptom Exaggeration", leading to loss of credibility and suspicions of possible secondary gains such as obtaining more narcotics, or wanting to appear disabled, for fraudulent reasons. 6. Only pain levels of 5 or below will be seen at our facility. 7. Pain levels of 6 and above will be sent to the Emergency Department and the appointment cancelled. _____________________________________________________________________________________________________________________________________________________________________  CANNABIDIOL (AKA: CBD Oil or Pills)  Applies to: All patients receiving prescriptions of controlled substances (written and/or electronic).  General Information: Cannabidiol (CBD) was discovered in 72. It is one of some 113 identified cannabinoids in cannabis (Marijuana) plants, accounting for up to 40% of the plant's extract. As of 2018, preliminary clinical research on cannabidiol included studies of anxiety, cognition, movement disorders, and pain.  Cannabidiol is consummed in multiple ways, including inhalation of cannabis smoke or vapor, as an aerosol spray into the cheek, and by mouth. It may be supplied as CBD oil containing CBD as the active ingredient (no added tetrahydrocannabinol (THC) or terpenes), a full-plant CBD-dominant hemp extract oil, capsules, dried cannabis, or as a liquid solution. CBD is thought not have the same psychoactivity as THC, and may affect the actions of THC. Studies suggest that CBD may interact with different biological targets, including cannabinoid receptors and other neurotransmitter receptors. As of 2018 the mechanism of action for its biological effects has not been determined.  In the [place], cannabidiol has a limited approval by the Food and Drug Administration (FDA) for treatment of only two types of epilepsy disorders. The side effects of long-term use of the drug include somnolence, decreased appetite, diarrhea, fatigue,  malaise, weakness, sleeping problems, and others.  CBD remains a Schedule I drug prohibited for any use.  Legality: Some manufacturers ship CBD products nationally, an illegal action which the FDA has not enforced in 2018, with CBD remaining the subject of an FDA investigational new drug evaluation, and is not considered legal as a dietary supplement or food ingredient as of December 2018. Federal illegality has made it difficult historically to conduct research on CBD. CBD is openly sold in head shops and health food stores in some states where such sales have not been explicitly legalized.  Warning: Because it is not FDA approved for general use or treatment of pain, it is not required to undergo the same manufacturing controls as prescription drugs.  This means that the available cannabidiol (CBD) may be contaminated with THC.  If this is the case, it will trigger a positive urine drug screen (UDS) test for cannabinoids (Marijuana).  Because a positive UDS for illicit substances is a violation of our medication agreement, your opioid analgesics (pain medicine) may be permanently discontinued. (Last update: 04/17/2017) ____________________________________________________________________________________________  ___________________  BMI Assessment: Estimated body mass index is 34.9 kg/m as calculated from the following:   Height as of this encounter: 5\' 3"  (1.6 m).   Weight as of this encounter: 197 lb (89.4 kg).  BMI interpretation table: BMI level Category Range association with higher incidence of chronic pain  <18 kg/m2 Underweight   18.5-24.9 kg/m2 Ideal body weight   25-29.9 kg/m2 Overweight Increased incidence by 20%  30-34.9 kg/m2 Obese (Class I) Increased incidence by 68%  35-39.9 kg/m2 Severe obesity (Class II) Increased incidence by 136%  >40 kg/m2 Extreme obesity (Class III) Increased incidence by 254%   Patient's current BMI Ideal Body weight  Body mass index is 34.9 kg/m.  Ideal body weight: 52.4 kg (115 lb 8.3 oz) Adjusted ideal body weight: 67.2 kg (148 lb 1.8 oz)   BMI Readings from Last 4 Encounters:  05/22/17 34.90 kg/m  03/27/17 34.90 kg/m  01/23/17 34.90 kg/m  12/03/16 37.31 kg/m   Wt Readings from Last 4 Encounters:  05/22/17 197 lb (89.4 kg)  03/27/17 197 lb (89.4 kg)  01/23/17 197 lb (89.4 kg)  11/21/16 204 lb (92.5 kg)   You were given one prescription for Oxycodone/Acetaminophen today.

## 2017-05-28 LAB — TOXASSURE SELECT 13 (MW), URINE

## 2017-06-16 ENCOUNTER — Telehealth: Payer: Self-pay | Admitting: Student in an Organized Health Care Education/Training Program

## 2017-06-16 NOTE — Telephone Encounter (Signed)
Spoke with Samreet and she states the [facility] can not accept a verbal communication to fill the Rx early d/t them being closed on fill date, that the Rx would have to be reprinted.  I told her that we would address this tomorrow and let her know when that would be ready.

## 2017-06-16 NOTE — Telephone Encounter (Signed)
Patient uses [facility] and they are closed on Monday, wants to know if she can get filled on Friday ? Please call patient.

## 2017-06-17 NOTE — Telephone Encounter (Signed)
Need to call patient in the morning Wednesday 06/18/17 to let her know what Crystal said and give her instructions.  First person to see this please consider calling the patient with this information.

## 2017-06-17 NOTE — Telephone Encounter (Signed)
She can but she needs to bring in her current medications to have them counted. This is because her last UDS was negative for her current medication and postive for a medication that she was not prescribed.  Thanks

## 2017-06-18 NOTE — Telephone Encounter (Signed)
Spoke with patient to let her know that [facility] will write a new Rx to reflect fill date of 06/20/17 since [facility] will be closed on 06/23/17 Monday.  I did instruct patient that she will need to bring original Rx in and her pills so that we can conduct a pill count.  Patient verbalizes  U/o information.

## 2017-06-19 ENCOUNTER — Ambulatory Visit: Payer: Medicare Other | Attending: Nurse Practitioner | Admitting: Nurse Practitioner

## 2017-06-19 ENCOUNTER — Other Ambulatory Visit: Payer: Self-pay | Admitting: Nurse Practitioner

## 2017-06-19 ENCOUNTER — Encounter: Payer: Self-pay | Admitting: Nurse Practitioner

## 2017-06-19 VITALS — BP 162/77 | HR 80 | Temp 98.7°F | Resp 16 | Ht 63.0 in | Wt 196.0 lb

## 2017-06-19 DIAGNOSIS — Z79899 Other long term (current) drug therapy: Secondary | ICD-10-CM | POA: Diagnosis not present

## 2017-06-19 DIAGNOSIS — Z87891 Personal history of nicotine dependence: Secondary | ICD-10-CM | POA: Insufficient documentation

## 2017-06-19 DIAGNOSIS — E785 Hyperlipidemia, unspecified: Secondary | ICD-10-CM | POA: Diagnosis not present

## 2017-06-19 DIAGNOSIS — M25561 Pain in right knee: Secondary | ICD-10-CM | POA: Diagnosis not present

## 2017-06-19 DIAGNOSIS — M47816 Spondylosis without myelopathy or radiculopathy, lumbar region: Secondary | ICD-10-CM

## 2017-06-19 DIAGNOSIS — Z955 Presence of coronary angioplasty implant and graft: Secondary | ICD-10-CM | POA: Diagnosis not present

## 2017-06-19 DIAGNOSIS — Z7902 Long term (current) use of antithrombotics/antiplatelets: Secondary | ICD-10-CM | POA: Insufficient documentation

## 2017-06-19 DIAGNOSIS — G894 Chronic pain syndrome: Secondary | ICD-10-CM | POA: Diagnosis present

## 2017-06-19 DIAGNOSIS — M069 Rheumatoid arthritis, unspecified: Secondary | ICD-10-CM | POA: Insufficient documentation

## 2017-06-19 DIAGNOSIS — Z6834 Body mass index (BMI) 34.0-34.9, adult: Secondary | ICD-10-CM | POA: Insufficient documentation

## 2017-06-19 DIAGNOSIS — Z79891 Long term (current) use of opiate analgesic: Secondary | ICD-10-CM

## 2017-06-19 DIAGNOSIS — M79672 Pain in left foot: Secondary | ICD-10-CM | POA: Diagnosis not present

## 2017-06-19 DIAGNOSIS — M5412 Radiculopathy, cervical region: Secondary | ICD-10-CM | POA: Diagnosis not present

## 2017-06-19 DIAGNOSIS — I2510 Atherosclerotic heart disease of native coronary artery without angina pectoris: Secondary | ICD-10-CM | POA: Diagnosis not present

## 2017-06-19 DIAGNOSIS — Z8719 Personal history of other diseases of the digestive system: Secondary | ICD-10-CM | POA: Insufficient documentation

## 2017-06-19 DIAGNOSIS — M47896 Other spondylosis, lumbar region: Secondary | ICD-10-CM | POA: Diagnosis not present

## 2017-06-19 DIAGNOSIS — Z7982 Long term (current) use of aspirin: Secondary | ICD-10-CM | POA: Insufficient documentation

## 2017-06-19 DIAGNOSIS — I252 Old myocardial infarction: Secondary | ICD-10-CM | POA: Insufficient documentation

## 2017-06-19 DIAGNOSIS — M25562 Pain in left knee: Secondary | ICD-10-CM | POA: Diagnosis not present

## 2017-06-19 DIAGNOSIS — I214 Non-ST elevation (NSTEMI) myocardial infarction: Secondary | ICD-10-CM | POA: Diagnosis not present

## 2017-06-19 DIAGNOSIS — I10 Essential (primary) hypertension: Secondary | ICD-10-CM | POA: Insufficient documentation

## 2017-06-19 DIAGNOSIS — Z5181 Encounter for therapeutic drug level monitoring: Secondary | ICD-10-CM | POA: Insufficient documentation

## 2017-06-19 DIAGNOSIS — M79671 Pain in right foot: Secondary | ICD-10-CM | POA: Insufficient documentation

## 2017-06-19 DIAGNOSIS — I259 Chronic ischemic heart disease, unspecified: Secondary | ICD-10-CM | POA: Insufficient documentation

## 2017-06-19 MED ORDER — OXYCODONE-ACETAMINOPHEN 10-325 MG PO TABS: 1.0000 | ORAL_TABLET | Freq: Four times a day (QID) | ORAL | 0 refills | Status: DC | PRN

## 2017-06-19 NOTE — Progress Notes (Signed)
Patient's Name: Karen Cervantes  MRN: [id number]  Referring Provider: Denton Lank, MD  DOB: [birth year]  PCP: Denton Lank, MD  DOS: 06/19/2017  Note by: Vevelyn Francois NP  Service setting: Ambulatory outpatient  Specialty: Interventional Pain Management  Location: [facility] (AMB) Pain Management Facility    Patient type: Established    Primary Reason(s) for Visit: Encounter for prescription drug management. (Level of risk: moderate)  CC: Generalized Body Aches (RA); Back Pain (lower more on the right); Foot Pain (bilateral); Knee Pain (bilateral); and Dental Pain  HPI  Karen Cervantes is a 62 y.o. year old, female patient, who comes today for a medication management evaluation. She has NSTEMI (non-ST elevated myocardial infarction) ([place]); CAD (coronary artery disease); Dyslipidemia, goal LDL below 70; Morbid obesity due to excess calories ([place]); Seropositive rheumatoid arthritis of multiple sites ([place]); Diastolic dysfunction; HTN (hypertension), benign; Ischemic chest pain; S/P coronary artery stent placement; Hidradenitis suppurativa; Ischemic heart disease due to coronary artery obstruction ([place]); Lumbar spondylosis; Pain medication agreement signed; Right cervical radiculopathy; Long term current use of opiate analgesic; and Chronic pain syndrome on their problem list. Her primarily concern today is the Generalized Body Aches (RA); Back Pain (lower more on the right); Foot Pain (bilateral); Knee Pain (bilateral); and Dental Pain  Pain Assessment: Location: (see visit info) (se visit info) Radiating: back pain radiating into right hip and into leg and under foot Onset: More than a month ago Duration: Chronic pain Quality: Discomfort, Numbness, Sharp, Spasm, Constant Severity: 10-Worst pain ever/10 (subjective, self-reported pain score)  Note: Reported level is compatible with observation.                          Effect on ADL: sleep disruption Timing: Constant Modifying factors: pillows for  positioning, medications, rest  BP: (!) 162/77  HR: 80  Karen Cervantes was last scheduled for an appointment on 05/22/2017 for medication management. During today's appointment we reviewed Ms. Stoermer's chronic pain status, as well as her outpatient medication regimen. She came in secondary to pharmacy not being open. She walked in without and apt. She had to leave secondary to picking up her granddaughter. Her husband later called . I called the patient. She admitted to overtaking her medication. She also admitted that the Tramadol in her urine came from tasting the brown gravy that she gives her dog . She admits that the gravy has Tramadol in it. She admits that she needs 5 of the Oxycodone per day.    The patient  reports that she does not use drugs. Her body mass index is 34.72 kg/m.  Further details on both, my assessment(s), as well as the proposed treatment plan, please see below.  Controlled Substance Pharmacotherapy Assessment REMS (Risk Evaluation and Mitigation Strategy)  Analgesic:Oxycodone/acetaminophen 10/325 mg 4 times daily oxycodone 40 mg per day MME/day:53m/day   PJanett Billow RN  06/19/2017  3:03 PM  Sign at close encounter Nursing Pain Medication Assessment:  Safety precautions to be maintained throughout the outpatient stay will include: orient to surroundings, keep bed in low position, maintain call bell within reach at all times, provide assistance with transfer out of bed and ambulation.  Medication Inspection Compliance: Pill count conducted under aseptic conditions, in front of the patient. Neither the pills nor the bottle was removed from the patient's sight at any time. Once count was completed pills were immediately returned to the patient in their original bottle.  Medication: Oxycodone/APAP Pill/Patch Count:  0 of 120 pills remain Pill/Patch Appearance: Markings consistent with prescribed medication Bottle Appearance: Standard pharmacy container. Clearly  labeled. Filled Date: 04 /26  / 2019 Last Medication intake:  tuesday morning   Pharmacokinetics: Liberation and absorption (onset of action): WNL Distribution (time to peak effect): WNL Metabolism and excretion (duration of action): WNL         Pharmacodynamics: Desired effects: Analgesia: [name] reports >50% benefit. Functional ability: Patient reports that medication allows her to accomplish basic ADLs Clinically meaningful improvement in function (CMIF): Sustained CMIF goals met Perceived effectiveness: Described as relatively effective, allowing for increase in activities of daily living (ADL) Undesirable effects: Side-effects or Adverse reactions: None reported Monitoring: [place] PMP: Online review of the past 12-month period conducted. Compliant with practice rules and regulations Last UDS on record: Summary  Date Value Ref Range Status  05/22/2017 FINAL  Final    Comment:    ==================================================================== TOXASSURE SELECT 13 (MW) ==================================================================== Test                             Result       Flag       Units Drug Present not Declared for Prescription Verification   Tramadol                       145          UNEXPECTED ng/mg creat   O-Desmethyltramadol            120          UNEXPECTED ng/mg creat    Source of tramadol is a prescription medication.    O-desmethyltramadol is an expected metabolite of tramadol. Drug Absent but Declared for Prescription Verification   Oxycodone                      Not Detected UNEXPECTED ng/mg creat ==================================================================== Test                      Result    Flag   Units      Ref Range   Creatinine              69               mg/dL      >=20 ==================================================================== Declared Medications:  The flagging and interpretation on this report are based on the   following declared medications.  Unexpected results may arise from  inaccuracies in the declared medications.  **Note: The testing scope of this panel includes these medications:  Oxycodone (Percocet)  **Note: The testing scope of this panel does not include following  reported medications:  Acetaminophen (Percocet)  Aluminum (Maalox)  Aspirin (Aspirin 81)  Atorvastatin (Lipitor)  Carvedilol (Coreg)  Clopidogrel (Plavix)  Cyclobenzaprine (Flexeril)  Ezetimibe (Zetia)  Gabapentin (Neurontin)  Lidocaine (Xylocaine)  Lisinopril (Prinivil)  Magnesium (Maalox)  Melatonin  Nitroglycerin (Nitrostat)  Omeprazole (Prilosec) ==================================================================== For clinical consultation, please call [phone]. ====================================================================    UDS interpretation: Non-Compliant The patient was given a final warning about the accuracy of reporting medications. Medication Assessment Form: Discrepancies found between patient's report and information collected Treatment compliance: Non-compliant. Steps taken to remind the patient of the seriousness of adequate therapy compliance Risk Assessment Profile: Aberrant behavior: See prior evaluations. None observed or detected today Comorbid factors increasing risk of overdose: See prior notes. No additional risks detected today   Risk of substance use disorder (SUD): High Opioid Risk Tool - 05/22/17 1001      Family History of Substance Abuse   Alcohol  Negative    Illegal Drugs  Negative    Rx Drugs  Negative      Personal History of Substance Abuse   Alcohol  Negative    Illegal Drugs  Negative    Rx Drugs  Negative      Age   Age between 5-45 years   No      Psychological Disease   Psychological Disease  Negative    Depression  Negative      Total Score   Opioid Risk Tool Scoring  0    Opioid Risk Interpretation  Low Risk      ORT Scoring interpretation  table:  Score <3 = Low Risk for SUD  Score between 4-7 = Moderate Risk for SUD  Score >8 = High Risk for Opioid Abuse   Risk Mitigation Strategies:  Patient Counseling: Covered Patient-Prescriber Agreement (PPA): Present and active  Notification to other healthcare providers: Done  Pharmacologic Plan: The described lack of effectiveness of Ms. Malson's medication is believed to be due to opioid tolerance.             Laboratory Chemistry  Inflammation Markers (CRP: Acute Phase) (ESR: Chronic Phase) No results found for: CRP, ESRSEDRATE, LATICACIDVEN                       Rheumatology Markers No results found for: RF, ANA, LABURIC, URICUR, LYMEIGGIGMAB, LYMEABIGMQN, HLAB27                      Renal Function Markers Lab Results  Component Value Date   BUN 7 11/21/2016   CREATININE 0.64 11/21/2016   GFRAA >60 11/21/2016   GFRNONAA >60 11/21/2016                              Hepatic Function Markers Lab Results  Component Value Date   AST 22 11/21/2016   ALT 18 11/21/2016   ALBUMIN 4.2 11/21/2016   ALKPHOS 78 11/21/2016   LIPASE 21 11/21/2016                        Electrolytes Lab Results  Component Value Date   NA 141 11/21/2016   K 3.7 11/21/2016   CL 105 11/21/2016   CALCIUM 9.4 11/21/2016                        Neuropathy Markers Lab Results  Component Value Date   HGBA1C 6.3 (H) 01/02/2015                        Bone Pathology Markers No results found for: [place], [id number], [id number], [id number], 25OHVITD1, 25OHVITD2, 25OHVITD3, TESTOFREE, TESTOSTERONE                       Coagulation Parameters Lab Results  Component Value Date   INR 1.02 01/02/2015   LABPROT 13.6 01/02/2015   APTT 35 01/02/2015   PLT 349 11/21/2016                        Cardiovascular Markers Lab Results  Component Value Date   CKMB 5.3 (H) 05/28/2014  TROPONINI <0.03 11/21/2016   HGB 12.4 11/21/2016   HCT 36.0 11/21/2016                         CA Markers No  results found for: CEA, CA125, LABCA2                      Note: Lab results reviewed.  Recent Diagnostic Imaging Results  MM SCREENING BREAST TOMO BILATERAL CLINICAL DATA:  Screening.  EXAM: 2D DIGITAL SCREENING BILATERAL MAMMOGRAM WITH 3D TOMO WITH CAD  COMPARISON:  Previous exam(s).  ACR Breast Density Category b: There are scattered areas of fibroglandular density.  FINDINGS: There are no findings suspicious for malignancy. Images were processed with CAD.  IMPRESSION: No mammographic evidence of malignancy. A result letter of this screening mammogram will be mailed directly to the patient.  RECOMMENDATION: Screening mammogram in one year. (Code:[id number])  BI-RADS CATEGORY  1: Negative.  Electronically Signed   By: Abelardo Diesel M.D.   On: 02/21/2017 13:28  Complexity Note: Imaging results reviewed. Results shared with Ms. Eulas Post, using Layman's terms.                         Meds   Current Outpatient Medications:  .  alum & mag hydroxide-simeth (MAALOX MAX) 400-400-40 MG/5ML suspension, Take 5 mLs by mouth every 6 (six) hours as needed for indigestion., Disp: 355 mL, Rfl: 0 .  aspirin EC 81 MG tablet, Take 1 tablet (81 mg total) by mouth daily., Disp: 30 tablet, Rfl: 0 .  atorvastatin (LIPITOR) 80 MG tablet, Take 80 mg by mouth every evening., Disp: , Rfl:  .  carvedilol (COREG) 12.5 MG tablet, Take 1 tablet (12.5 mg total) by mouth 2 (two) times daily with a meal., Disp: 30 tablet, Rfl: 0 .  clopidogrel (PLAVIX) 75 MG tablet, Take 1 tablet (75 mg total) by mouth daily., Disp: 30 tablet, Rfl: 0 .  cyclobenzaprine (FLEXERIL) 10 MG tablet, Take 10 mg by mouth at bedtime., Disp: , Rfl:  .  ezetimibe (ZETIA) 10 MG tablet, Take 10 mg by mouth daily., Disp: , Rfl:  .  FOLIC ACID PO, Take 1 tablet by mouth every morning. , Disp: , Rfl:  .  gabapentin (NEURONTIN) 300 MG capsule, Take 600 mg by mouth 2 (two) times daily. , Disp: , Rfl:  .  lidocaine (XYLOCAINE) 2 %  solution, Use as directed 20 mLs in the mouth or throat as needed for mouth pain., Disp: 100 mL, Rfl: 0 .  lisinopril (PRINIVIL,ZESTRIL) 10 MG tablet, Take 1 tablet (10 mg total) by mouth daily., Disp: 30 tablet, Rfl: 0 .  Melatonin 5 MG CAPS, Take 10-30 mg by mouth at bedtime. , Disp: , Rfl:  .  Multiple Vitamins-Minerals (MULTIVITAMIN PO), Take 1 tablet by mouth every morning., Disp: , Rfl:  .  nitroGLYCERIN (NITROSTAT) 0.4 MG SL tablet, Place 1 tablet (0.4 mg total) under the tongue every 5 (five) minutes as needed for chest pain., Disp: 25 tablet, Rfl: 3 .  omeprazole (PRILOSEC) 20 MG capsule, Take 20 mg by mouth 2 (two) times daily before a meal., Disp: , Rfl:  .  [START ON 06/20/2017] oxyCODONE-acetaminophen (PERCOCET) 10-325 MG tablet, Take 1 tablet by mouth every 6 (six) hours as needed for pain. To last until 07/24/17, Disp: 120 tablet, Rfl: 0  ROS  Constitutional: Denies any fever or chills Gastrointestinal: No reported hemesis, hematochezia, vomiting,  or acute GI distress Musculoskeletal: Denies any acute onset joint swelling, redness, loss of ROM, or weakness Neurological: No reported episodes of acute onset apraxia, aphasia, dysarthria, agnosia, amnesia, paralysis, loss of coordination, or loss of consciousness  Allergies  Ms. Killgore is allergic to remicade [infliximab].  PFSH  Drug: Ms. Dillie  reports that she does not use drugs. Alcohol:  reports that she does not drink alcohol. Tobacco:  reports that she has quit smoking. She has never used smokeless tobacco. Medical:  has a past medical history of CAD (coronary artery disease), Diastolic dysfunction, Gastric ulcer, HLD (hyperlipidemia), Hypertension, Myocardial infarction (HCC) (may 2016, December 2016), Obesity, and RA (rheumatoid arthritis) (HCC). Surgical: Ms. Dowe  has a past surgical history that includes Cardiac catheterization (N/A, 05/30/2014); Cardiac catheterization (N/A, 05/30/2014); Cardiac catheterization (N/A,  01/02/2015); and Cardiac catheterization (N/A, 01/02/2015). Family: family history includes Heart Problems in her brother, brother, and brother; Heart attack in her father; Heart disease in her father.  Constitutional Exam  General appearance: Well nourished, well developed, and well hydrated. In no apparent acute distress Vitals:   06/19/17 1504  BP: (!) 162/77  Pulse: 80  Resp: 16  Temp: 98.7 F (37.1 C)  TempSrc: Oral  SpO2: 99%  Weight: 196 lb (88.9 kg)  Height: 5' 3" (1.6 m)   BMI Assessment: Estimated body mass index is 34.72 kg/m as calculated from the following:   Height as of this encounter: 5' 3" (1.6 m).   Weight as of this encounter: 196 lb (88.9 kg).  BMI interpretation table: BMI level Category Range association with higher incidence of chronic pain  <18 kg/m2 Underweight   18.5-24.9 kg/m2 Ideal body weight   25-29.9 kg/m2 Overweight Increased incidence by 20%  30-34.9 kg/m2 Obese (Class I) Increased incidence by 68%  35-39.9 kg/m2 Severe obesity (Class II) Increased incidence by 136%  >40 kg/m2 Extreme obesity (Class III) Increased incidence by 254%   Patient's current BMI Ideal Body weight  Body mass index is 34.72 kg/m. Ideal body weight: 52.4 kg (115 lb 8.3 oz) Adjusted ideal body weight: 67 kg (147 lb 11.4 oz)   BMI Readings from Last 4 Encounters:  06/19/17 34.72 kg/m  05/22/17 34.90 kg/m  03/27/17 34.90 kg/m  01/23/17 34.90 kg/m   Wt Readings from Last 4 Encounters:  06/19/17 196 lb (88.9 kg)  05/22/17 197 lb (89.4 kg)  03/27/17 197 lb (89.4 kg)  01/23/17 197 lb (89.4 kg)  Psych/Mental status: Alert, oriented x 3 (person, place, & time)       Eyes: PERLA Respiratory: No evidence of acute respiratory distress   Assessment  Primary Diagnosis & Pertinent Problem List: The primary encounter diagnosis was Long term current use of opiate analgesic. A diagnosis of Lumbar spondylosis was also pertinent to this visit.  Status Diagnosis   Controlled Controlled Controlled 1. Long term current use of opiate analgesic   2. Lumbar spondylosis     Problems updated and reviewed during this visit: No problems updated. Plan of Care  Pharmacotherapy (Medications Ordered): Meds ordered this encounter  Medications  . oxyCODONE-acetaminophen (PERCOCET) 10-325 MG tablet    Sig: Take 1 tablet by mouth every 6 (six) hours as needed for pain. To last until 07/24/17    Dispense:  120 tablet    Refill:  0    Order Specific Question:   Supervising Provider    Answer:   NAVEIRA, FRANCISCO [[id number]]   New Prescriptions   No medications on file   Medications administered   today: Cristela L. Bancroft had no medications administered during this visit. Lab-work, procedure(s), and/or referral(s): Orders Placed This Encounter  Procedures  . ToxASSURE Select 13 (MW), Urine   Imaging and/or referral(s): None  Interventional therapies: Planned, scheduled, and/or pending:   Not at this time. Informed that this is her final warning and she was encouraged to read her pain contract.    Provider-requested follow-up: Return for Appointment As Scheduled.  Future Appointments  Date Time Provider [place]  07/17/2017 10:00 AM Gillis Santa, MD [facility] None   Primary Care Physician: Denton Lank, MD Location: [facility] Outpatient Pain Management Facility Note by: Vevelyn Francois NP Date: 06/19/2017; Time: 3:37 PM  Pain Score Disclaimer: We use the NRS-11 scale. This is a self-reported, subjective measurement of pain severity with only modest accuracy. It is used primarily to identify changes within a particular patient. It must be understood that outpatient pain scales are significantly less accurate that those used for research, where they can be applied under ideal controlled circumstances with minimal exposure to variables. In reality, the score is likely to be a combination of pain intensity and pain affect, where pain affect describes the  degree of emotional arousal or changes in action readiness caused by the sensory experience of pain. Factors such as social and work situation, setting, emotional state, anxiety levels, expectation, and prior pain experience may influence pain perception and show large inter-individual differences that may also be affected by time variables.  Patient instructions provided during this appointment: There are no Patient Instructions on file for this visit.

## 2017-06-19 NOTE — Progress Notes (Signed)
Nursing Pain Medication Assessment:  Safety precautions to be maintained throughout the outpatient stay will include: orient to surroundings, keep bed in low position, maintain call bell within reach at all times, provide assistance with transfer out of bed and ambulation.  Medication Inspection Compliance: Pill count conducted under aseptic conditions, in front of the patient. Neither the pills nor the bottle was removed from the patient's sight at any time. Once count was completed pills were immediately returned to the patient in their original bottle.  Medication: Oxycodone/APAP Pill/Patch Count: 0 of 120 pills remain Pill/Patch Appearance: Markings consistent with prescribed medication Bottle Appearance: Standard pharmacy container. Clearly labeled. Filled Date: 04 /26  / 2019 Last Medication intake:  tuesday morning

## 2017-06-27 LAB — TOXASSURE SELECT 13 (MW), URINE

## 2017-07-17 ENCOUNTER — Encounter: Payer: Self-pay | Admitting: Student in an Organized Health Care Education/Training Program

## 2017-07-17 ENCOUNTER — Ambulatory Visit
Payer: Medicare Other | Attending: Student in an Organized Health Care Education/Training Program | Admitting: Student in an Organized Health Care Education/Training Program

## 2017-07-17 ENCOUNTER — Other Ambulatory Visit: Payer: Self-pay

## 2017-07-17 VITALS — BP 109/54 | HR 81 | Temp 99.1°F | Ht 63.0 in | Wt 193.0 lb

## 2017-07-17 DIAGNOSIS — M25551 Pain in right hip: Secondary | ICD-10-CM | POA: Diagnosis not present

## 2017-07-17 DIAGNOSIS — I10 Essential (primary) hypertension: Secondary | ICD-10-CM | POA: Insufficient documentation

## 2017-07-17 DIAGNOSIS — M549 Dorsalgia, unspecified: Secondary | ICD-10-CM | POA: Diagnosis present

## 2017-07-17 DIAGNOSIS — M0579 Rheumatoid arthritis with rheumatoid factor of multiple sites without organ or systems involvement: Secondary | ICD-10-CM

## 2017-07-17 DIAGNOSIS — M7918 Myalgia, other site: Secondary | ICD-10-CM | POA: Diagnosis not present

## 2017-07-17 DIAGNOSIS — M47816 Spondylosis without myelopathy or radiculopathy, lumbar region: Secondary | ICD-10-CM

## 2017-07-17 DIAGNOSIS — N644 Mastodynia: Secondary | ICD-10-CM | POA: Diagnosis not present

## 2017-07-17 DIAGNOSIS — Z7982 Long term (current) use of aspirin: Secondary | ICD-10-CM | POA: Diagnosis not present

## 2017-07-17 DIAGNOSIS — L732 Hidradenitis suppurativa: Secondary | ICD-10-CM | POA: Diagnosis not present

## 2017-07-17 DIAGNOSIS — M5412 Radiculopathy, cervical region: Secondary | ICD-10-CM

## 2017-07-17 DIAGNOSIS — Z79899 Other long term (current) drug therapy: Secondary | ICD-10-CM | POA: Insufficient documentation

## 2017-07-17 DIAGNOSIS — I2510 Atherosclerotic heart disease of native coronary artery without angina pectoris: Secondary | ICD-10-CM | POA: Insufficient documentation

## 2017-07-17 DIAGNOSIS — I214 Non-ST elevation (NSTEMI) myocardial infarction: Secondary | ICD-10-CM | POA: Diagnosis not present

## 2017-07-17 DIAGNOSIS — E785 Hyperlipidemia, unspecified: Secondary | ICD-10-CM | POA: Diagnosis not present

## 2017-07-17 DIAGNOSIS — M545 Low back pain: Secondary | ICD-10-CM | POA: Insufficient documentation

## 2017-07-17 DIAGNOSIS — M069 Rheumatoid arthritis, unspecified: Secondary | ICD-10-CM | POA: Diagnosis not present

## 2017-07-17 DIAGNOSIS — Z955 Presence of coronary angioplasty implant and graft: Secondary | ICD-10-CM | POA: Diagnosis not present

## 2017-07-17 DIAGNOSIS — G8929 Other chronic pain: Secondary | ICD-10-CM | POA: Diagnosis present

## 2017-07-17 DIAGNOSIS — M79604 Pain in right leg: Secondary | ICD-10-CM | POA: Diagnosis not present

## 2017-07-17 DIAGNOSIS — Z79891 Long term (current) use of opiate analgesic: Secondary | ICD-10-CM | POA: Diagnosis not present

## 2017-07-17 DIAGNOSIS — G894 Chronic pain syndrome: Secondary | ICD-10-CM | POA: Diagnosis not present

## 2017-07-17 DIAGNOSIS — M059 Rheumatoid arthritis with rheumatoid factor, unspecified: Secondary | ICD-10-CM | POA: Insufficient documentation

## 2017-07-17 MED ORDER — OXYCODONE-ACETAMINOPHEN 10-325 MG PO TABS: 1.0000 | ORAL_TABLET | Freq: Four times a day (QID) | ORAL | 0 refills | Status: DC | PRN

## 2017-07-17 NOTE — Progress Notes (Signed)
Nursing Pain Medication Assessment:  Safety precautions to be maintained throughout the outpatient stay will include: orient to surroundings, keep bed in low position, maintain call bell within reach at all times, provide assistance with transfer out of bed and ambulation.  Medication Inspection Compliance: Pill count conducted under aseptic conditions, in front of the patient. Neither the pills nor the bottle was removed from the patient's sight at any time. Once count was completed pills were immediately returned to the patient in their original bottle.  Medication: Hydrocodone/APAP Pill/Patch Count: 0 of 120 pills remain Pill/Patch Appearance: Markings consistent with prescribed medication Bottle Appearance: Standard pharmacy container. Clearly labeled. Filled Date: 5 / 24 / 2019 Last Medication intake:  Today

## 2017-07-17 NOTE — Patient Instructions (Signed)
You were given 3 prescriptions for Percocet today. 

## 2017-07-17 NOTE — Progress Notes (Signed)
Patient's Name: Karen Cervantes  MRN: [id number]  Referring Provider: Denton Lank, MD  DOB: [birth year]  PCP: Denton Lank, MD  DOS: 07/17/2017  Note by: Gillis Santa, MD  Service setting: Ambulatory outpatient  Specialty: Interventional Pain Management  Location: [facility] (AMB) Pain Management Facility    Patient type: Established   Primary Reason(s) for Visit: Encounter for prescription drug management. (Level of risk: moderate)  CC: Back Pain  HPI  Karen Cervantes is a 62 y.o. year old, female patient, who comes today for a medication management evaluation. She has NSTEMI (non-ST elevated myocardial infarction) (Karen Cervantes); CAD (coronary artery disease); Dyslipidemia, goal LDL below 70; Morbid obesity due to excess calories (Karen Cervantes); Seropositive rheumatoid arthritis of multiple sites (Karen Cervantes); Diastolic dysfunction; HTN (hypertension), benign; Ischemic chest pain; S/P coronary artery stent placement; Hidradenitis suppurativa; Ischemic heart disease due to coronary artery obstruction (Karen Cervantes); Lumbar spondylosis; Pain medication agreement signed; Right cervical radiculopathy; Long term current use of opiate analgesic; and Chronic pain syndrome on their problem list. Her primarily concern today is the Back Pain  Pain Assessment: Location: Right Back Radiating: back pain radiating into right hip to front of right leg Onset: More than a month ago Duration: Chronic pain Quality: Discomfort, Constant, Sharp, Spasm Severity: 10-Worst pain ever/10 (subjective, self-reported pain score)  Note: Reported level is inconsistent with clinical observations. Clinically the patient looks like a 3/10 A 3/10 is viewed as "Moderate" and described as significantly interfering with activities of daily living (ADL). It becomes difficult to feed, bathe, get dressed, get on and off the toilet or to perform personal hygiene functions. Difficult to get in and out of bed or a chair without assistance. Very distracting. With effort, it can be  ignored when deeply involved in activities.       When using our objective Pain Scale, levels between 6 and 10/10 are said to belong in an emergency room, as it progressively worsens from a 6/10, described as severely limiting, requiring emergency care not usually available at an outpatient pain management facility. At a 6/10 level, communication becomes difficult and requires great effort. Assistance to reach the emergency department may be required. Facial flushing and profuse sweating along with potentially dangerous increases in heart rate and blood pressure will be evident. Effect on ADL: unable to sleep at night Timing: Constant Modifying factors: Medication and rest,  BP: (!) 109/54  HR: 81  Karen Cervantes was last scheduled for an appointment on 06/16/2017 for medication management. During today's appointment we reviewed Karen Cervantes's chronic pain status, as well as her outpatient medication regimen.  Patient returns today for medication management.  She is accompanied by her husband today.  She states that her axial low back pain has been getting worse and is now radiating into her right hip and down her right leg.  She is also complaining of dental pain.  Patient continues on her Plavix.  Of note patient has been running out of her medications early since she states that she has been utilizing her Percocet 5 times a day rather than the 4 times a day that she has been prescribed.  The patient is requesting to increase Percocet to 5 times a day.  The patient  reports that she does not use drugs. Her body mass index is 34.19 kg/m.  Further details on both, my assessment(s), as well as the proposed treatment plan, please see below.  Controlled Substance Pharmacotherapy Assessment REMS (Risk Evaluation and Mitigation Strategy)  Analgesic: Percocet 10 mg 4 times  daily as needed quantity 120/month MME/day: 60 mg/day.  Karen Fischer, RN  07/17/2017 10:16 AM  Sign at close encounter Nursing Pain  Medication Assessment:  Safety precautions to be maintained throughout the outpatient stay will include: orient to surroundings, keep bed in low position, maintain call bell within reach at all times, provide assistance with transfer out of bed and ambulation.  Medication Inspection Compliance: Pill count conducted under aseptic conditions, in front of the patient. Neither the pills nor the bottle was removed from the patient's sight at any time. Once count was completed pills were immediately returned to the patient in their original bottle.  Medication: Hydrocodone/APAP Pill/Patch Count: 0 of 120 pills remain Pill/Patch Appearance: Markings consistent with prescribed medication Bottle Appearance: Standard pharmacy container. Clearly labeled. Filled Date: 5 / 24 / 2019 Last Medication intake:  Today Pharmacokinetics: Liberation and absorption (onset of action): WNL Distribution (time to peak effect): WNL Metabolism and excretion (duration of action): WNL         Pharmacodynamics: Desired effects: Analgesia: Karen Cervantes reports >50% benefit. Functional ability: Patient reports that medication allows her to accomplish basic ADLs Clinically meaningful improvement in function (CMIF): Sustained CMIF goals met Perceived effectiveness: Described as relatively effective, allowing for increase in activities of daily living (ADL) Undesirable effects: Side-effects or Adverse reactions: None reported Monitoring: [place] PMP: Online review of the past 31-monthperiod conducted. Compliant with practice rules and regulations Last UDS on record: Summary  Date Value Ref Range Status  06/19/2017 FINAL  Final    Comment:    ==================================================================== TOXASSURE SELECT 13 (MW) ==================================================================== Test                             Result       Flag       Units Drug Absent but Declared for Prescription Verification    Oxycodone                      Not Detected UNEXPECTED ng/mg creat ==================================================================== Test                      Result    Flag   Units      Ref Range   Creatinine              47               mg/dL      >=20 ==================================================================== Declared Medications:  The flagging and interpretation on this report are based on the  following declared medications.  Unexpected results may arise from  inaccuracies in the declared medications.  **Note: The testing scope of this panel includes these medications:  Oxycodone (Percocet)  **Note: The testing scope of this panel does not include following  reported medications:  Acetaminophen (Percocet)  Aluminum (Maalox)  Aspirin (Aspirin 81)  Atorvastatin (Lipitor)  Carvedilol (Coreg)  Clopidogrel (Plavix)  Cyclobenzaprine (Flexeril)  Ezetimibe (Zetia)  Folic acid  Gabapentin (Neurontin)  Lidocaine (Xylocaine)  Lisinopril (Prinivil)  Magnesium (Maalox)  Melatonin  Multivitamin  Nitroglycerin (Nitrostat)  Omeprazole (Prilosec) ==================================================================== For clinical consultation, please call ([phone] ====================================================================    UDS interpretation: Unexpected findings not considered significantly abnormal UDS negative for oxycodone since patient ran out and was not taking any oxycodone prior to her previous UDS.  Will repeat UDS today.  This will also likely be negative for oxycodone since the patient states that she  ran out about 5 days ago.         Of note the patient did have a prior UDS that was positive for tramadol.  She states that she places tramadol in her dog's food and that sometimes she tries the the food that she prepares for her dog. Medication Assessment Form: Discrepancies found between patient's report and information collected Treatment compliance:  Non-compliant. Steps taken to remind the patient of the seriousness of adequate therapy compliance Risk Assessment Profile: Aberrant behavior: claims that "nothing else works", early requests for medication refills, extensive time discussing medicaiton, non-adherence to medication regimen, non-adherence to non-pharmacological elements of the treatment plan, non-compliance with medical instructions on the proper use of the medication, non-compliance with practice rules and regulations, resistance to changing therapy and taking more medication than prescribed Comorbid factors increasing risk of overdose: See prior notes. No additional risks detected today Risk of substance use disorder (SUD): Moderate-to-High Opioid Risk Tool - 05/22/17 1001      Family History of Substance Abuse   Alcohol  Negative    Illegal Drugs  Negative    Rx Drugs  Negative      Personal History of Substance Abuse   Alcohol  Negative    Illegal Drugs  Negative    Rx Drugs  Negative      Age   Age between 65-45 years   No      Psychological Disease   Psychological Disease  Negative    Depression  Negative      Total Score   Opioid Risk Tool Scoring  0    Opioid Risk Interpretation  Low Risk      ORT Scoring interpretation table:  Score <3 = Low Risk for SUD  Score between 4-7 = Moderate Risk for SUD  Score >8 = High Risk for Opioid Abuse   Risk Mitigation Strategies:  Patient Counseling: Covered Patient-Prescriber Agreement (PPA): Present and active  Notification to other healthcare providers: Done  Pharmacologic Plan: After careful consideration, I have decided to decline Ms. Smail's request for an increase in the daily dose of her opioid analgesics.             Laboratory Chemistry  Inflammation Markers (CRP: Acute Phase) (ESR: Chronic Phase) No results found for: CRP, ESRSEDRATE, LATICACIDVEN                       Rheumatology Markers No results found for: RF, ANA, LABURIC, URICUR, LYMEIGGIGMAB,  LYMEABIGMQN, HLAB27                      Renal Function Markers Lab Results  Component Value Date   BUN 7 11/21/2016   CREATININE 0.64 11/21/2016   GFRAA >60 11/21/2016   GFRNONAA >60 11/21/2016                             Hepatic Function Markers Lab Results  Component Value Date   AST 22 11/21/2016   ALT 18 11/21/2016   ALBUMIN 4.2 11/21/2016   ALKPHOS 78 11/21/2016   LIPASE 21 11/21/2016                        Electrolytes Lab Results  Component Value Date   NA 141 11/21/2016   K 3.7 11/21/2016   CL 105 11/21/2016   CALCIUM 9.4 11/21/2016  Neuropathy Markers Lab Results  Component Value Date   HGBA1C 6.3 (H) 01/02/2015                        Bone Pathology Markers No results found for: VD25OH, [id number], HC6237SE8, BT5176HY0, 25OHVITD1, 25OHVITD2, 25OHVITD3, TESTOFREE, TESTOSTERONE                       Coagulation Parameters Lab Results  Component Value Date   INR 1.02 01/02/2015   LABPROT 13.6 01/02/2015   APTT 35 01/02/2015   PLT 349 11/21/2016                        Cardiovascular Markers Lab Results  Component Value Date   CKMB 5.3 (H) 05/28/2014   TROPONINI <0.03 11/21/2016   HGB 12.4 11/21/2016   HCT 36.0 11/21/2016                         CA Markers No results found for: CEA, CA125, LABCA2                      Note: Lab results reviewed.  Recent Diagnostic Imaging Results  MM SCREENING BREAST TOMO BILATERAL CLINICAL DATA:  Screening.  EXAM: 2D DIGITAL SCREENING BILATERAL MAMMOGRAM WITH 3D TOMO WITH CAD  COMPARISON:  Previous exam(s).  ACR Breast Density Category b: There are scattered areas of fibroglandular density.  FINDINGS: There are no findings suspicious for malignancy. Images were processed with CAD.  IMPRESSION: No mammographic evidence of malignancy. A result letter of this screening mammogram will be mailed directly to the patient.  RECOMMENDATION: Screening mammogram in one year.  (Code:[id number]-B-01Y)  BI-RADS CATEGORY  1: Negative.  Electronically Signed   By: Abelardo Diesel M.D.   On: 02/21/2017 13:28  Complexity Note: Imaging results reviewed. Results shared with Ms. Eulas Post, using Layman's terms.                         Meds   Current Outpatient Medications:  .  alum & mag hydroxide-simeth (MAALOX MAX) 400-400-40 MG/5ML suspension, Take 5 mLs by mouth every 6 (six) hours as needed for indigestion., Disp: 355 mL, Rfl: 0 .  aspirin EC 81 MG tablet, Take 1 tablet (81 mg total) by mouth daily., Disp: 30 tablet, Rfl: 0 .  atorvastatin (LIPITOR) 80 MG tablet, Take 80 mg by mouth every evening., Disp: , Rfl:  .  carvedilol (COREG) 12.5 MG tablet, Take 1 tablet (12.5 mg total) by mouth 2 (two) times daily with a meal., Disp: 30 tablet, Rfl: 0 .  clopidogrel (PLAVIX) 75 MG tablet, Take 1 tablet (75 mg total) by mouth daily., Disp: 30 tablet, Rfl: 0 .  cyclobenzaprine (FLEXERIL) 10 MG tablet, Take 10 mg by mouth at bedtime., Disp: , Rfl:  .  ezetimibe (ZETIA) 10 MG tablet, Take 10 mg by mouth daily., Disp: , Rfl:  .  FOLIC ACID PO, Take 1 tablet by mouth every morning. , Disp: , Rfl:  .  gabapentin (NEURONTIN) 300 MG capsule, Take 600 mg by mouth 2 (two) times daily. , Disp: , Rfl:  .  lidocaine (XYLOCAINE) 2 % solution, Use as directed 20 mLs in the mouth or throat as needed for mouth pain., Disp: 100 mL, Rfl: 0 .  lisinopril (PRINIVIL,ZESTRIL) 10 MG tablet, Take 1 tablet (10 mg total) by mouth daily.,  Disp: 30 tablet, Rfl: 0 .  Melatonin 5 MG CAPS, Take 10-30 mg by mouth at bedtime. , Disp: , Rfl:  .  Multiple Vitamins-Minerals (MULTIVITAMIN PO), Take 1 tablet by mouth every morning., Disp: , Rfl:  .  nitroGLYCERIN (NITROSTAT) 0.4 MG SL tablet, Place 1 tablet (0.4 mg total) under the tongue every 5 (five) minutes as needed for chest pain., Disp: 25 tablet, Rfl: 3 .  omeprazole (PRILOSEC) 20 MG capsule, Take 20 mg by mouth 2 (two) times daily before a meal., Disp: , Rfl:  .   oxyCODONE-acetaminophen (PERCOCET) 10-325 MG tablet, Take 1 tablet by mouth every 6 (six) hours as needed for pain. For chronic pain To last for 30 days from fill date To fill on or after: 07/18/17, 08/15/17, 09/15/17, Disp: 120 tablet, Rfl: 0  ROS  Constitutional: Denies any fever or chills Gastrointestinal: No reported hemesis, hematochezia, vomiting, or acute GI distress Musculoskeletal: Denies any acute onset joint swelling, redness, loss of ROM, or weakness Neurological: No reported episodes of acute onset apraxia, aphasia, dysarthria, agnosia, amnesia, paralysis, loss of coordination, or loss of consciousness  Allergies  Ms. Vega is allergic to remicade [infliximab].  [place]  Drug: Ms. Blust  reports that she does not use drugs. Alcohol:  reports that she does not drink alcohol. Tobacco:  reports that she has quit smoking. She has never used smokeless tobacco. Medical:  has a past medical history of CAD (coronary artery disease), Diastolic dysfunction, Gastric ulcer, HLD (hyperlipidemia), Hypertension, Myocardial infarction [facility]) (may 2016, December 2016), Obesity, and RA (rheumatoid arthritis) ([place]). Surgical: Ms. Herst  has a past surgical history that includes Cardiac catheterization (N/A, 05/30/2014); Cardiac catheterization (N/A, 05/30/2014); Cardiac catheterization (N/A, 01/02/2015); and Cardiac catheterization (N/A, 01/02/2015). Family: family history includes Heart Problems in her brother, brother, and brother; Heart attack in her father; Heart disease in her father.  Constitutional Exam  General appearance: Well nourished, well developed, and well hydrated. In no apparent acute distress Vitals:   07/17/17 0956  BP: (!) 109/54  Pulse: 81  Temp: 99.1 F (37.3 C)  SpO2: 98%  Weight: 193 lb (87.5 kg)  Height: _0  (1.6 m)   BMI Assessment: Estimated body mass index is 34.19 kg/m as calculated from the following:   Height as of this encounter: _1  (1.6 m).   Weight as of this  encounter: 193 lb (87.5 kg).  BMI interpretation table: BMI level Category Range association with higher incidence of chronic pain  <18 kg/m2 Underweight   18.5-24.9 kg/m2 Ideal body weight   25-29.9 kg/m2 Overweight Increased incidence by 20%  30-34.9 kg/m2 Obese (Class I) Increased incidence by 68%  35-39.9 kg/m2 Severe obesity (Class II) Increased incidence by 136%  >40 kg/m2 Extreme obesity (Class III) Increased incidence by 254%   Patient's current BMI Ideal Body weight  Body mass index is 34.19 kg/m. Ideal body weight: 52.4 kg (115 lb 8.3 oz) Adjusted ideal body weight: 66.5 kg (146 lb 8.2 oz)   BMI Readings from Last 4 Encounters:  07/17/17 34.19 kg/m  06/19/17 34.72 kg/m  05/22/17 34.90 kg/m  03/27/17 34.90 kg/m   Wt Readings from Last 4 Encounters:  07/17/17 193 lb (87.5 kg)  06/19/17 196 lb (88.9 kg)  05/22/17 197 lb (89.4 kg)  03/27/17 197 lb (89.4 kg)  Psych/Mental status: Alert, oriented x 3 (person, place, & time)       Eyes: PERLA Respiratory: No evidence of acute respiratory distress  Cervical Spine Area Exam  Skin &  Axial Inspection: No masses, redness, edema, swelling, or associated skin lesions Alignment: Symmetrical Functional ROM: Unrestricted ROM      Stability: No instability detected Muscle Tone/Strength: Functionally intact. No obvious neuro-muscular anomalies detected. Sensory (Neurological): Unimpaired Palpation: No palpable anomalies              Upper Extremity (UE) Exam    Side: Right upper extremity  Side: Left upper extremity  Skin & Extremity Inspection: Skin color, temperature, and hair growth are WNL. No peripheral edema or cyanosis. No masses, redness, swelling, asymmetry, or associated skin lesions. No contractures.  Skin & Extremity Inspection: Skin color, temperature, and hair growth are WNL. No peripheral edema or cyanosis. No masses, redness, swelling, asymmetry, or associated skin lesions. No contractures.  Functional ROM:  Unrestricted ROM          Functional ROM: Unrestricted ROM          Muscle Tone/Strength: Functionally intact. No obvious neuro-muscular anomalies detected.  Muscle Tone/Strength: Functionally intact. No obvious neuro-muscular anomalies detected.  Sensory (Neurological): Unimpaired          Sensory (Neurological): Unimpaired          Palpation: No palpable anomalies              Palpation: No palpable anomalies              Provocative Test(s):  Phalen's test: deferred Tinel's test: deferred Apley's scratch test (touch opposite shoulder):  Action 1 (Across chest): deferred Action 2 (Overhead): deferred Action 3 (LB reach): deferred   Provocative Test(s):  Phalen's test: deferred Tinel's test: deferred Apley's scratch test (touch opposite shoulder):  Action 1 (Across chest): deferred Action 2 (Overhead): deferred Action 3 (LB reach): deferred    Thoracic Spine Area Exam  Skin & Axial Inspection: No masses, redness, or swelling Alignment: Symmetrical Functional ROM: Unrestricted ROM Stability: No instability detected Muscle Tone/Strength: Functionally intact. No obvious neuro-muscular anomalies detected. Sensory (Neurological): Unimpaired Muscle strength & Tone: No palpable anomalies  Lumbar Spine Area Exam  Skin & Axial Inspection: No masses, redness, or swelling Alignment: Symmetrical Functional ROM: Decreased ROM       Stability: No instability detected Muscle Tone/Strength: Functionally intact. No obvious neuro-muscular anomalies detected. Sensory (Neurological): Dermatomal pain pattern and musculoskeletal Palpation: No palpable anomalies       Provocative Tests: Lumbar Hyperextension/rotation test: Positive bilaterally for facet joint pain. Lumbar quadrant test (Kemp's test): deferred today       Lumbar Lateral bending test: (+) ipsilateral radicular pain, on the right. Positive for right-sided foraminal stenosis. Patrick's Maneuver: (+) for bilateral S-I arthralgia              FABER test: deferred today       Thigh-thrust test: deferred today       S-I compression test: deferred today       S-I distraction test: deferred today        Gait & Posture Assessment  Ambulation: Limited Gait: Relatively normal for age and body habitus Posture: WNL   Lower Extremity Exam    Side: Right lower extremity  Side: Left lower extremity  Stability: No instability observed          Stability: No instability observed          Skin & Extremity Inspection: Skin color, temperature, and hair growth are WNL. No peripheral edema or cyanosis. No masses, redness, swelling, asymmetry, or associated skin lesions. No contractures.  Skin & Extremity Inspection: Skin color, temperature,  and hair growth are WNL. No peripheral edema or cyanosis. No masses, redness, swelling, asymmetry, or associated skin lesions. No contractures.  Functional ROM: Unrestricted ROM                  Functional ROM: Unrestricted ROM                  Muscle Tone/Strength: Functionally intact. No obvious neuro-muscular anomalies detected.  Muscle Tone/Strength: Functionally intact. No obvious neuro-muscular anomalies detected.  Sensory (Neurological): Unimpaired  Sensory (Neurological): Unimpaired  Palpation: No palpable anomalies  Palpation: No palpable anomalies   Assessment  Primary Diagnosis & Pertinent Problem List: The primary encounter diagnosis was Long term current use of opiate analgesic. Diagnoses of Lumbar spondylosis, Long term prescription opiate use, Seropositive rheumatoid arthritis of multiple sites ([place]), Hidradenitis suppurativa, Chronic pain syndrome, Right cervical radiculopathy, Rheumatoid arthritis involving multiple sites, unspecified rheumatoid factor presence ([place]), and Myofascial pain were also pertinent to this visit.  Status Diagnosis  Persistent Persistent Persistent 1. Long term current use of opiate analgesic   2. Lumbar spondylosis   3. Long term prescription opiate use   4.  Seropositive rheumatoid arthritis of multiple sites ([place])   5. Hidradenitis suppurativa   6. Chronic pain syndrome   7. Right cervical radiculopathy   8. Rheumatoid arthritis involving multiple sites, unspecified rheumatoid factor presence ([place])   9. Myofascial pain     General Recommendations: The pain condition that the patient suffers from is best treated with a multidisciplinary approach that involves an increase in physical activity to prevent de-conditioning and worsening of the pain cycle, as well as psychological counseling (formal and/or informal) to address the co-morbid psychological affects of pain. Treatment will often involve judicious use of pain medications and interventional procedures to decrease the pain, allowing the patient to participate in the physical activity that will ultimately produce long-lasting pain reductions. The goal of the multidisciplinary approach is to return the patient to a higher level of overall function and to restore their ability to perform activities of daily living.  62 year old female with a past medical history of seropositive rheumatoid arthritis diagnosed in 1996 has been on chronic opioid therapy since around that time. Patient was previously seen by [facility] rheumatology however was having difficulty contacting and arranging follow-up with them. They were primarily prescribing her opioid medications. They tried to wean her Percocet 10 mg from 6 times a day to 5 times a day but the patient was unable to tolerate it. She did experience opioid withdrawal and had to go to the emergency department. Patient did follow up with the [facility] pain management who prescribed her a Butrans patch 10 g. Patient states that this helped for the first couple days but is not very effective.    Patient has been seeing [place] in the pain clinic since September 16, 2016.  Over the last couple of visits, we have found that the patient has been overutilizing her Percocet medications.  She has  been taking it 5 times a day.  She states that she is unable to take it 4 times a day.  She was previously on it 6 times a day in the past at [facility] rheumatology.    Patient follows up today for medication management.  She states that her axial low back pain has been getting worse and is now radiating into her right hip and down her right leg.  She is also complaining of dental pain.  Patient continues on her Plavix.  Of note patient has been running out of her medications early since she states that she has been utilizing her Percocet 5 times a day rather than the 4 times a day that she has been prescribed.  The patient is requesting to increase Percocet to 5 times a day.  I had an extensive discussion with the patient about therapeutic plan when he comes to chronic opioid therapy.  As we have discussed on multiple occasions, her Percocet dose will not exceed 10 mg 4 times a day as needed for moderate to severe pain related to her rheumatoid arthritis.  Even after my reasoning, the patient was still adamant about asking for Percocet 5 times a day.  This was reinforced by her husband as well.  Patient's current MME is equal to 60 and I am somewhat concerned about her insistent negotiation about trying to increase her Percocet.  I offered the patient a trial of alternative muscle relaxants including Robaxin as well as Skelaxin which are not as sedating however the patient declined.  She does take Flexeril 10 mg nightly.  Patient is also on maximum dose gabapentin, 900 mg 3 times a day.  Patient is unable to tolerate NSAIDs.  Patient has tried Cymbalta in the past which resulted in nausea and GI upset.  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  Patient also had one urine drug screen that was positive for tramadol.  She states that her dog is on tramadol and that she places tramadol in the dog's food to help with his pain.  Upon recheck, her UDS was negative for tramadol.  Should the patient have any other discrepancies with her urine drug screen such as positive screen for tramadol, patient will be discharged from this clinic.  Also no further dose escalations in regards to Percocet from her current dose of 10 mg every 6 hours as needed quantity 120/month.  Plan: -UDS today.  Will likely be negative for oxycodone and its metabolites since the patient has been out of this medication for greater than 5 days.  Should also be negative for any benzodiazepine or any other opioid medications. -Continue gabapentin 900 mg 3 times a day as currently prescribed -Offered trial of alternative muscle relaxant including Robaxin or Skelaxin but the patient declined -Can consider genicular nerve block for knee pain related to knee osteoarthritis if patient is able to safely come off of her Plavix.  We will need clearance from prescribing physician if this is the case.  Can also consider bilateral SI joint injections for bilateral hip pain as well. -Prescription for Percocet at his current dose of 10 mg 4 times daily as needed, quantity 120.  Prescription provided for 2 months.  Future medications considerations: TCA- amitriptyline (check QTC on EKG to make sure not prolonged). Can consider baclofen.  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 She can be evaluated for interventions once she is off her blood thinners (MUST HAVE CARDIAC CLEARANCE SINCE PT IS HIGH RISK to be off antiplatelet therapy)  Considerations below:  -Genicular nerve blocks bilaterally  -Lumbar medial branch block with goal radiofrequency ablation -Intra-articular knee steroid injections. -SI joint injections bilaterally  Plan  of Care  Pharmacotherapy (  Medications Ordered): Meds ordered this encounter  Medications  . DISCONTD: oxyCODONE-acetaminophen (PERCOCET) 10-325 MG tablet    Sig: Take 1 tablet by mouth every 6 (six) hours as needed for pain. For chronic pain To last for 30 days from fill date To fill on or after: 07/18/17, 08/15/17, 09/15/17    Dispense:  120 tablet    Refill:  0  . DISCONTD: oxyCODONE-acetaminophen (PERCOCET) 10-325 MG tablet    Sig: Take 1 tablet by mouth every 6 (six) hours as needed for pain. For chronic pain To last for 30 days from fill date To fill on or after: 07/18/17, 08/15/17, 09/15/17    Dispense:  120 tablet    Refill:  0  . oxyCODONE-acetaminophen (PERCOCET) 10-325 MG tablet    Sig: Take 1 tablet by mouth every 6 (six) hours as needed for pain. For chronic pain To last for 30 days from fill date To fill on or after: 07/18/17, 08/15/17, 09/15/17    Dispense:  120 tablet    Refill:  0   Lab-work, procedure(s), and/or referral(s): Orders Placed This Encounter  Procedures  . Compliance Drug Analysis, Ur    Time Note: Greater than 50% of the 25 minute(s) of face-to-face time spent with Ms. Narasimhan, was spent in counseling/coordination of care regarding: the appropriate use of the pain scale, opioid tolerance, "Drug Holidays", Ms. Schmuhl primary cause of pain, the treatment plan, medication side effects, the opioid analgesic risks and possible complications, the appropriate use of her medications, realistic expectations, the goals of pain management (increased in functionality), the medication agreement and the patient's responsibilities when it comes to controlled substances.  Provider-requested follow-up: Return in about 3 months (around 10/17/2017) for MM with Crystal.  No future appointments.  Primary Care Physician: Denton Lank, MD Location: [facility] Note by: Gillis Santa, M.D Date: 07/17/2017; Time: 10:56 AM  There are no Patient  Instructions on file for this visit.

## 2017-07-22 LAB — COMPLIANCE DRUG ANALYSIS, UR

## 2017-10-09 ENCOUNTER — Encounter: Payer: Self-pay | Admitting: Nurse Practitioner

## 2017-10-09 ENCOUNTER — Other Ambulatory Visit: Payer: Self-pay

## 2017-10-09 ENCOUNTER — Ambulatory Visit: Payer: Medicare Other | Attending: Nurse Practitioner | Admitting: Nurse Practitioner

## 2017-10-09 VITALS — BP 138/76 | HR 86 | Temp 98.7°F | Ht 63.0 in | Wt 197.0 lb

## 2017-10-09 DIAGNOSIS — M059 Rheumatoid arthritis with rheumatoid factor, unspecified: Secondary | ICD-10-CM | POA: Diagnosis not present

## 2017-10-09 DIAGNOSIS — Z6834 Body mass index (BMI) 34.0-34.9, adult: Secondary | ICD-10-CM | POA: Diagnosis not present

## 2017-10-09 DIAGNOSIS — Z87891 Personal history of nicotine dependence: Secondary | ICD-10-CM | POA: Diagnosis not present

## 2017-10-09 DIAGNOSIS — I259 Chronic ischemic heart disease, unspecified: Secondary | ICD-10-CM | POA: Insufficient documentation

## 2017-10-09 DIAGNOSIS — Z79891 Long term (current) use of opiate analgesic: Secondary | ICD-10-CM | POA: Diagnosis not present

## 2017-10-09 DIAGNOSIS — M5412 Radiculopathy, cervical region: Secondary | ICD-10-CM | POA: Insufficient documentation

## 2017-10-09 DIAGNOSIS — E785 Hyperlipidemia, unspecified: Secondary | ICD-10-CM | POA: Insufficient documentation

## 2017-10-09 DIAGNOSIS — I10 Essential (primary) hypertension: Secondary | ICD-10-CM | POA: Insufficient documentation

## 2017-10-09 DIAGNOSIS — Z955 Presence of coronary angioplasty implant and graft: Secondary | ICD-10-CM | POA: Diagnosis not present

## 2017-10-09 DIAGNOSIS — G894 Chronic pain syndrome: Secondary | ICD-10-CM | POA: Insufficient documentation

## 2017-10-09 DIAGNOSIS — Z79899 Other long term (current) drug therapy: Secondary | ICD-10-CM | POA: Diagnosis not present

## 2017-10-09 DIAGNOSIS — C55 Malignant neoplasm of uterus, part unspecified: Secondary | ICD-10-CM

## 2017-10-09 DIAGNOSIS — M47816 Spondylosis without myelopathy or radiculopathy, lumbar region: Secondary | ICD-10-CM | POA: Diagnosis present

## 2017-10-09 DIAGNOSIS — L732 Hidradenitis suppurativa: Secondary | ICD-10-CM | POA: Diagnosis not present

## 2017-10-09 DIAGNOSIS — Z7982 Long term (current) use of aspirin: Secondary | ICD-10-CM | POA: Diagnosis not present

## 2017-10-09 DIAGNOSIS — I2510 Atherosclerotic heart disease of native coronary artery without angina pectoris: Secondary | ICD-10-CM | POA: Diagnosis not present

## 2017-10-09 DIAGNOSIS — Z8719 Personal history of other diseases of the digestive system: Secondary | ICD-10-CM | POA: Diagnosis not present

## 2017-10-09 DIAGNOSIS — I252 Old myocardial infarction: Secondary | ICD-10-CM | POA: Insufficient documentation

## 2017-10-09 HISTORY — DX: Malignant neoplasm of uterus, part unspecified: C55

## 2017-10-09 MED ORDER — OXYCODONE-ACETAMINOPHEN 10-325 MG PO TABS: 1.0000 | ORAL_TABLET | Freq: Four times a day (QID) | ORAL | 0 refills | Status: AC | PRN

## 2017-10-09 NOTE — Progress Notes (Signed)
Patient's Name: Karen Cervantes  MRN: [id number]  Referring Provider: Denton Lank, MD  DOB: 11-12-1955  PCP: Donnie Coffin, MD  DOS: 10/09/2017  Note by: Vevelyn Francois NP  Service setting: Ambulatory outpatient  Specialty: Interventional Pain Management  Location: [facility] (AMB) Pain Management Facility    Patient type: Established    Primary Reason(s) for Visit: Encounter for prescription drug management. (Level of risk: moderate)  CC: No chief complaint on file.  HPI  Ms. Jr is a 62 y.o. year old, female patient, who comes today for a medication management evaluation. She has NSTEMI (non-ST elevated myocardial infarction) ([place]); CAD (coronary artery disease); Dyslipidemia, goal LDL below 70; Morbid obesity due to excess calories ([place]); Seropositive rheumatoid arthritis of multiple sites ([place]); Diastolic dysfunction; HTN (hypertension), benign; Ischemic chest pain; S/P coronary artery stent placement; Hidradenitis suppurativa; Ischemic heart disease due to coronary artery obstruction ([place]); Lumbar spondylosis; Pain medication agreement signed; Right cervical radiculopathy; Long term current use of opiate analgesic; and Chronic pain syndrome on their problem list. Her primarily concern today is the No chief complaint on file.  Pain Assessment: Location:   Back Radiating: denies Onset: More than a month ago Duration: Chronic pain Quality: Sharp, Discomfort Severity: 8 /10 (subjective, self-reported pain score)  Note: Reported level is compatible with observation. Clinically the patient looks like a 2/10 A 2/10 is viewed as "Mild to Moderate" and described as noticeable and distracting. Impossible to hide from other people. More frequent flare-ups. Still possible to adapt and function close to normal. It can be very annoying and may have occasional stronger flare-ups. With discipline, patients may get used to it and adapt. Information on the proper use of the pain scale provided to the patient  today. When using our objective Pain Scale, levels between 6 and 10/10 are said to belong in an emergency room, as it progressively worsens from a 6/10, described as severely limiting, requiring emergency care not usually available at an outpatient pain management facility. At a 6/10 level, communication becomes difficult and requires great effort. Assistance to reach the emergency department may be required. Facial flushing and profuse sweating along with potentially dangerous increases in heart rate and blood pressure will be evident. Effect on ADL: unable to bend over Timing: Intermittent Modifying factors: medications, heat BP: 138/76  HR: 86  Ms. Pink was last scheduled for an appointment on 06/19/2017 for medication management. During today's appointment we reviewed Ms. Quinn's chronic pain status, as well as her outpatient medication regimen.  The patient  reports that she does not use drugs. Her body mass index is 34.9 kg/m.  Further details on both, my assessment(s), as well as the proposed treatment plan, please see below.  Controlled Substance Pharmacotherapy Assessment REMS (Risk Evaluation and Mitigation Strategy)  Analgesic:Oxycodone/acetaminophen 10/325 mg 4 times daily oxycodone 40 mg per day MME/day:92m/day BChauncey Fischer RN  10/09/2017 11:43 AM  Sign at close encounter Nursing Pain Medication Assessment:  Safety precautions to be maintained throughout the outpatient stay will include: orient to surroundings, keep bed in low position, maintain call bell within reach at all times, provide assistance with transfer out of bed and ambulation.  Medication Inspection Compliance: Pill count conducted under aseptic conditions, in front of the patient. Neither the pills nor the bottle was removed from the patient's sight at any time. Once count was completed pills were immediately returned to the patient in their original bottle.  Medication: Oxycodone/APAP Pill/Patch Count: 0  of 120 pills remain  Pill/Patch Appearance: Markings consistent with prescribed medication Bottle Appearance: Standard pharmacy container. Clearly labeled. Filled Date: 8 / 41 / 2019 Last Medication intake:  Today   Pharmacokinetics: Liberation and absorption (onset of action): WNL Distribution (time to peak effect): WNL Metabolism and excretion (duration of action): WNL         Pharmacodynamics: Desired effects: Analgesia: Ms. Eland reports >50% benefit. Functional ability: Patient reports that medication does help, but not nearly as much as she would like Clinically meaningful improvement in function (CMIF): Sustained CMIF goals met Perceived effectiveness: Described as ineffective and would like to make some changes would like to have medication 5 times daily Undesirable effects: Side-effects or Adverse reactions: None reported Monitoring: [place] PMP: Online review of the past 10-monthperiod conducted. Compliant with practice rules and regulations Last UDS on record: Summary  Date Value Ref Range Status  07/17/2017 FINAL  Final    Comment:    ==================================================================== TOXASSURE COMP DRUG ANALYSIS,UR ==================================================================== Test                             Result       Flag       Units Drug Present   Gabapentin                     PRESENT   Cyclobenzaprine                PRESENT   Desmethylcyclobenzaprine       PRESENT    Desmethylcyclobenzaprine is an expected metabolite of    cyclobenzaprine.   Acetaminophen                  PRESENT   Diphenhydramine                PRESENT ==================================================================== Test                      Result    Flag   Units      Ref Range   Creatinine              147              mg/dL      >=20 ==================================================================== Declared Medications:  Medication list was not  provided. ==================================================================== For clinical consultation, please call [phone] ====================================================================    UDS interpretation: Non-Compliant Absence of vacation  medication Assessment Form: Reviewed. Patient indicates being compliant with therapy Treatment compliance: Compliant Risk Assessment Profile: Aberrant behavior: continued use despite claims of ineffective analgesia, non-adherence to non-pharmacological elements of the treatment plan and taking more medication than prescribed Comorbid factors increasing risk of overdose: See prior notes. No additional risks detected today Opioid risk tool (ORT) (Total Score): 0 Personal History of Substance Abuse (SUD-Substance use disorder):  Alcohol: Negative  Illegal Drugs: Negative  Rx Drugs: Negative  ORT Risk Level calculation: Low Risk Risk of substance use disorder (SUD): Moderate-to-High Opioid Risk Tool - 10/09/17 1153      Family History of Substance Abuse   Alcohol  Negative    Illegal Drugs  Negative    Rx Drugs  Negative      Personal History of Substance Abuse   Alcohol  Negative    Illegal Drugs  Negative    Rx Drugs  Negative      Psychological Disease   Psychological Disease  Negative    Depression  Negative  Total Score   Opioid Risk Tool Scoring  0    Opioid Risk Interpretation  Low Risk      ORT Scoring interpretation table:  Score <3 = Low Risk for SUD  Score between 4-7 = Moderate Risk for SUD  Score >8 = High Risk for Opioid Abuse   Risk Mitigation Strategies:  Patient Counseling: Covered Patient-Prescriber Agreement (PPA): Present and active  Notification to other healthcare providers: Done  Pharmacologic Plan: No change in therapy, at this time.             Laboratory Chemistry  Inflammation Markers (CRP: Acute Phase) (ESR: Chronic Phase) No results found for: CRP, ESRSEDRATE, LATICACIDVEN                        Rheumatology Markers No results found for: RF, ANA, LABURIC, URICUR, LYMEIGGIGMAB, LYMEABIGMQN, HLAB27                      Renal Function Markers Lab Results  Component Value Date   BUN 7 11/21/2016   CREATININE 0.64 11/21/2016   GFRAA >60 11/21/2016   GFRNONAA >60 11/21/2016                             Hepatic Function Markers Lab Results  Component Value Date   AST 22 11/21/2016   ALT 18 11/21/2016   ALBUMIN 4.2 11/21/2016   ALKPHOS 78 11/21/2016   LIPASE 21 11/21/2016                        Electrolytes Lab Results  Component Value Date   NA 141 11/21/2016   K 3.7 11/21/2016   CL 105 11/21/2016   CALCIUM 9.4 11/21/2016                        Neuropathy Markers Lab Results  Component Value Date   HGBA1C 6.3 (H) 01/02/2015                        CNS Tests No results found for: SDES, GRAMSTAIN, CULT, COLORCSF, APPEARCSF, RBCCOUNTCSF, WBCCSF, POLYSCSF, LYMPHSCSF, EOSCSF, PROTEINCSF, GLUCCSF, JCVIRUS, CSFOLI, IGGCSF, IGGALB, IGGIND                      Bone Pathology Markers No results found for: VD25OH, [id number], [id number], [id number], 25OHVITD1, 25OHVITD2, 25OHVITD3, TESTOFREE, TESTOSTERONE                       Coagulation Parameters Lab Results  Component Value Date   INR 1.02 01/02/2015   LABPROT 13.6 01/02/2015   APTT 35 01/02/2015   PLT 349 11/21/2016                        Cardiovascular Markers Lab Results  Component Value Date   CKMB 5.3 (H) 05/28/2014   TROPONINI <0.03 11/21/2016   HGB 12.4 11/21/2016   HCT 36.0 11/21/2016                         CA Markers No results found for: CEA, CA125, LABCA2                      Note: Lab results reviewed.  Recent Diagnostic Imaging Results  MM SCREENING BREAST TOMO BILATERAL CLINICAL DATA:  Screening.  EXAM: 2D DIGITAL SCREENING BILATERAL MAMMOGRAM WITH 3D TOMO WITH CAD  COMPARISON:  Previous exam(s).  ACR Breast Density Category b: There are scattered areas  of fibroglandular density.  FINDINGS: There are no findings suspicious for malignancy. Images were processed with CAD.  IMPRESSION: No mammographic evidence of malignancy. A result letter of this screening mammogram will be mailed directly to the patient.  RECOMMENDATION: Screening mammogram in one year. (Code:[id number])  BI-RADS CATEGORY  1: Negative.  Electronically Signed   By: Abelardo Diesel M.D.   On: 02/21/2017 13:28  Complexity Note: Imaging results reviewed. Results shared with Ms. Eulas Post, using Layman's terms.                         Meds   Current Outpatient Medications:  .  alum & mag hydroxide-simeth (MAALOX MAX) 400-400-40 MG/5ML suspension, Take 5 mLs by mouth every 6 (six) hours as needed for indigestion., Disp: 355 mL, Rfl: 0 .  aspirin EC 81 MG tablet, Take 1 tablet (81 mg total) by mouth daily., Disp: 30 tablet, Rfl: 0 .  atorvastatin (LIPITOR) 80 MG tablet, Take 80 mg by mouth every evening., Disp: , Rfl:  .  carvedilol (COREG) 12.5 MG tablet, Take 1 tablet (12.5 mg total) by mouth 2 (two) times daily with a meal., Disp: 30 tablet, Rfl: 0 .  clopidogrel (PLAVIX) 75 MG tablet, Take 1 tablet (75 mg total) by mouth daily., Disp: 30 tablet, Rfl: 0 .  cyclobenzaprine (FLEXERIL) 10 MG tablet, Take 10 mg by mouth at bedtime., Disp: , Rfl:  .  ezetimibe (ZETIA) 10 MG tablet, Take 10 mg by mouth daily., Disp: , Rfl:  .  FOLIC ACID PO, Take 1 tablet by mouth every morning. , Disp: , Rfl:  .  gabapentin (NEURONTIN) 300 MG capsule, Take 600 mg by mouth 2 (two) times daily. , Disp: , Rfl:  .  lidocaine (XYLOCAINE) 2 % solution, Use as directed 20 mLs in the mouth or throat as needed for mouth pain., Disp: 100 mL, Rfl: 0 .  lisinopril (PRINIVIL,ZESTRIL) 10 MG tablet, Take 1 tablet (10 mg total) by mouth daily., Disp: 30 tablet, Rfl: 0 .  Melatonin 5 MG CAPS, Take 10-30 mg by mouth at bedtime. , Disp: , Rfl:  .  Multiple Vitamins-Minerals (MULTIVITAMIN PO), Take 1 tablet by  mouth every morning., Disp: , Rfl:  .  nitroGLYCERIN (NITROSTAT) 0.4 MG SL tablet, Place 1 tablet (0.4 mg total) under the tongue every 5 (five) minutes as needed for chest pain., Disp: 25 tablet, Rfl: 3 .  omeprazole (PRILOSEC) 20 MG capsule, Take 20 mg by mouth 2 (two) times daily before a meal., Disp: , Rfl:  .  [START ON 12/14/2017] oxyCODONE-acetaminophen (PERCOCET) 10-325 MG tablet, Take 1 tablet by mouth every 6 (six) hours as needed for pain., Disp: 120 tablet, Rfl: 0 .  [START ON 11/14/2017] oxyCODONE-acetaminophen (PERCOCET) 10-325 MG tablet, Take 1 tablet by mouth every 6 (six) hours as needed for pain., Disp: 120 tablet, Rfl: 0 .  [START ON 10/15/2017] oxyCODONE-acetaminophen (PERCOCET) 10-325 MG tablet, Take 1 tablet by mouth every 6 (six) hours as needed for pain., Disp: 120 tablet, Rfl: 0  ROS  Constitutional: Denies any fever or chills Gastrointestinal: No reported hemesis, hematochezia, vomiting, or acute GI distress Musculoskeletal: Denies any acute onset joint swelling, redness, loss of ROM, or weakness Neurological: No reported episodes of acute onset apraxia,  aphasia, dysarthria, agnosia, amnesia, paralysis, loss of coordination, or loss of consciousness  Allergies  Ms. Barbe is allergic to remicade [infliximab].  [place]  Drug: Ms. Wallick  reports that she does not use drugs. Alcohol:  reports that she does not drink alcohol. Tobacco:  reports that she has quit smoking. She has never used smokeless tobacco. Medical:  has a past medical history of CAD (coronary artery disease), Diastolic dysfunction, Gastric ulcer, HLD (hyperlipidemia), Hypertension, Myocardial infarction [facility]) (may 2016, December 2016), Obesity, and RA (rheumatoid arthritis) ([place]). Surgical: Ms. Tegeler  has a past surgical history that includes Cardiac catheterization (N/A, 05/30/2014); Cardiac catheterization (N/A, 05/30/2014); Cardiac catheterization (N/A, 01/02/2015); and Cardiac catheterization (N/A,  01/02/2015). Family: family history includes Heart Problems in her brother, brother, and brother; Heart attack in her father; Heart disease in her father.  Constitutional Exam  General appearance: Well nourished, well developed, and well hydrated. In no apparent acute distress Vitals:   10/09/17 1143  BP: 138/76  Pulse: 86  Temp: 98.7 F (37.1 C)  SpO2: 96%  Weight: 197 lb (89.4 kg)  Height: '5\' 3"'  (1.6 m)   BMI Assessment: Estimated body mass index is 34.9 kg/m as calculated from the following:   Height as of this encounter: '5\' 3"'  (1.6 m).   Weight as of this encounter: 197 lb (89.4 kg). Psych/Mental status: Alert, oriented x 3 (person, place, & time)       Eyes: PERLA Respiratory: No evidence of acute respiratory distress  Lumbar Spine Area Exam  Skin & Axial Inspection: No masses, redness, or swelling Alignment: Symmetrical Functional ROM: Unrestricted ROM       Stability: No instability detected Muscle Tone/Strength: Functionally intact. No obvious neuro-muscular anomalies detected. Sensory (Neurological): Unimpaired Palpation: No palpable anomalies       Provocative Tests: Hyperextension/rotation test: deferred today       Lumbar quadrant test (Kemp's test): deferred today       Lateral bending test: deferred today       Patrick's Maneuver: deferred today                   FABER test: deferred today                   S-I anterior distraction/compression test: deferred today         S-I lateral compression test: deferred today         S-I Thigh-thrust test: deferred today         S-I Gaenslen's test: deferred today          Gait & Posture Assessment  Ambulation: Unassisted Gait: Relatively normal for age and body habitus Posture: WNL   Lower Extremity Exam    Side: Right lower extremity  Side: Left lower extremity  Stability: No instability observed          Stability: No instability observed          Skin & Extremity Inspection: Skin color, temperature, and hair  growth are WNL. No peripheral edema or cyanosis. No masses, redness, swelling, asymmetry, or associated skin lesions. No contractures.  Skin & Extremity Inspection: Skin color, temperature, and hair growth are WNL. No peripheral edema or cyanosis. No masses, redness, swelling, asymmetry, or associated skin lesions. No contractures.  Functional ROM: Unrestricted ROM                  Functional ROM: Unrestricted ROM  Muscle Tone/Strength: Functionally intact. No obvious neuro-muscular anomalies detected.  Muscle Tone/Strength: Functionally intact. No obvious neuro-muscular anomalies detected.  Sensory (Neurological): Unimpaired  Sensory (Neurological): Unimpaired  Palpation: No palpable anomalies  Palpation: No palpable anomalies   Assessment  Primary Diagnosis & Pertinent Problem List: The primary encounter diagnosis was Lumbar spondylosis. Diagnoses of Right cervical radiculopathy, Hidradenitis suppurativa, and Long term prescription opiate use were also pertinent to this visit.  Status Diagnosis  Controlled Controlled Controlled 1. Lumbar spondylosis   2. Right cervical radiculopathy   3. Hidradenitis suppurativa   4. Long term prescription opiate use     Problems updated and reviewed during this visit: No problems updated. Plan of Care  Pharmacotherapy (Medications Ordered): Meds ordered this encounter  Medications  . oxyCODONE-acetaminophen (PERCOCET) 10-325 MG tablet    Sig: Take 1 tablet by mouth every 6 (six) hours as needed for pain.    Dispense:  120 tablet    Refill:  0    Do not place this medication, or any other prescription from our practice, on "Automatic Refill". Patient may have prescription filled one day early if pharmacy is closed on scheduled refill date.    Order Specific Question:   Supervising Provider    Answer:   Milinda Pointer [phone]  . oxyCODONE-acetaminophen (PERCOCET) 10-325 MG tablet    Sig: Take 1 tablet by mouth every 6 (six)  hours as needed for pain.    Dispense:  120 tablet    Refill:  0    Do not place this medication, or any other prescription from our practice, on "Automatic Refill". Patient may have prescription filled one day early if pharmacy is closed on scheduled refill date    Order Specific Question:   Supervising Provider    Answer:   Milinda Pointer [phone]  . oxyCODONE-acetaminophen (PERCOCET) 10-325 MG tablet    Sig: Take 1 tablet by mouth every 6 (six) hours as needed for pain.    Dispense:  120 tablet    Refill:  0    Do not place this medication, or any other prescription from our practice, on "Automatic Refill". Patient may have prescription filled one day early if pharmacy is closed on scheduled refill date    Order Specific Question:   Supervising Provider    Answer:   Milinda Pointer [[id number]]   New Prescriptions   No medications on file   Medications administered today: Sybilla L. Walsworth had no medications administered during this visit. Lab-work, procedure(s), and/or referral(s): Orders Placed This Encounter  Procedures  . ToxASSURE Select 13 (MW), Urine   Imaging and/or referral(s): None  Interventional therapies: Planned, scheduled, and/or pending:   Not at this time.    Provider-requested follow-up: Return in about 3 months (around 01/08/2018) for MedMgmt with Me Don[facility]).  Future Appointments  Date Time Provider [place]  01/06/2018 10:30 AM Vevelyn Francois, NP [facility] None   Primary Care Physician: Donnie Coffin, MD Location: [facility] Note by: Vevelyn Francois NP Date: 10/09/2017; Time: 4:04 PM  Pain Score Disclaimer: We use the NRS-11 scale. This is a self-reported, subjective measurement of pain severity with only modest accuracy. It is used primarily to identify changes within a particular patient. It must be understood that outpatient pain scales are significantly less accurate that those used for research,  where they can be applied under ideal controlled circumstances with minimal exposure to variables. In reality, the score is likely to be a combination of  pain intensity and pain affect, where pain affect describes the degree of emotional arousal or changes in action readiness caused by the sensory experience of pain. Factors such as social and work situation, setting, emotional state, anxiety levels, expectation, and prior pain experience may influence pain perception and show large inter-individual differences that may also be affected by time variables.  Patient instructions provided during this appointment: Patient Instructions   ____________________________________________________________________________________________  Medication Rules  Applies to: All patients receiving prescriptions (written or electronic).  Pharmacy of record: Pharmacy where electronic prescriptions will be sent. If written prescriptions are taken to a different pharmacy, please inform the nursing staff. The pharmacy listed in the electronic medical record should be the one where you would like electronic prescriptions to be sent.  Prescription refills: Only during scheduled appointments. Applies to both, written and electronic prescriptions.  NOTE: The following applies primarily to controlled substances (Opioid* Pain Medications).   Patient's responsibilities: 1. Pain Pills: Bring all pain pills to every appointment (except for procedure appointments). 2. Pill Bottles: Bring pills in original pharmacy bottle. Always bring newest bottle. Bring bottle, even if empty. 3. Medication refills: You are responsible for knowing and keeping track of what medications you need refilled. The day before your appointment, write a list of all prescriptions that need to be refilled. Bring that list to your appointment and give it to the admitting nurse. Prescriptions will be written only during appointments. If you forget a medication,  it will not be "Called in", "Faxed", or "electronically sent". You will need to get another appointment to get these prescribed. 4. Prescription Accuracy: You are responsible for carefully inspecting your prescriptions before leaving our office. Have the discharge nurse carefully go over each prescription with you, before taking them home. Make sure that your name is accurately spelled, that your address is correct. Check the name and dose of your medication to make sure it is accurate. Check the number of pills, and the written instructions to make sure they are clear and accurate. Make sure that you are given enough medication to last until your next medication refill appointment. 5. Taking Medication: Take medication as prescribed. Never take more pills than instructed. Never take medication more frequently than prescribed. Taking less pills or less frequently is permitted and encouraged, when it comes to controlled substances (written prescriptions).  6. Inform other Doctors: Always inform, all of your healthcare providers, of all the medications you take. 7. Pain Medication from other Providers: You are not allowed to accept any additional pain medication from any other Doctor or Healthcare provider. There are two exceptions to this rule. (see below) In the event that you require additional pain medication, you are responsible for notifying us, as stated below. 8. Medication Agreement: You are responsible for carefully reading and following our Medication Agreement. This must be signed before receiving any prescriptions from our practice. Safely store a copy of your signed Agreement. Violations to the Agreement will result in no further prescriptions. (Additional copies of our Medication Agreement are available upon request.) 9. Laws, Rules, & Regulations: All patients are expected to follow all Federal and [employer], [employer], Rules, [employer]. Ignorance of the Laws does not constitute a valid  excuse. The use of any illegal substances is prohibited. 10. Adopted CDC guidelines & recommendations: Target dosing levels will be at or below 60 MME/day. Use of benzodiazepines** is not recommended.  Exceptions: There are only two exceptions to the rule of not receiving pain medications from other  Healthcare Providers. 1. Exception #1 (Emergencies): In the event of an emergency (i.e.: accident requiring emergency care), you are allowed to receive additional pain medication. However, you are responsible for: As soon as you are able, call our office [phone], at any time of the day or night, and leave a message stating your name, the date and nature of the emergency, and the name and dose of the medication prescribed. In the event that your call is answered by a member of our staff, make sure to document and save the date, time, and the name of the person that took your information.  2. Exception #2 (Planned Surgery): In the event that you are scheduled by another doctor or dentist to have any type of surgery or procedure, you are allowed (for a period no longer than 30 days), to receive additional pain medication, for the acute post-op pain. However, in this case, you are responsible for picking up a copy of our "Post-op Pain Management for Surgeons" handout, and giving it to your surgeon or dentist. This document is available at our office, and does not require an appointment to obtain it. Simply go to our office during business hours (Monday-Thursday from 8:00 AM to 4:00 PM) (Friday 8:00 AM to 12:00 Noon) or if you have a scheduled appointment with [place], prior to your surgery, and ask for it by name. In addition, you will need to provide [place] with your name, name of your surgeon, type of surgery, and date of procedure or surgery.  *Opioid medications include: morphine, codeine, oxycodone, oxymorphone, hydrocodone, hydromorphone, meperidine, tramadol, tapentadol, buprenorphine, fentanyl,  methadone. **Benzodiazepine medications include: diazepam (Valium), alprazolam (Xanax), clonazepam (Klonopine), lorazepam (Ativan), clorazepate (Tranxene), chlordiazepoxide (Librium), estazolam (Prosom), oxazepam (Serax), temazepam (Restoril), triazolam (Halcion) (Last updated: 03/27/2017) ____________________________________________________________________________________________   ____________________________________________________________________________________________  Pain Scale  Introduction: The pain score used by this practice is the Verbal Numerical Rating Scale (VNRS-11). This is an 11-point scale. It is for adults and children 10 years or older. There are significant differences in how the pain score is reported, used, and applied. Forget everything you learned in the past and learn this scoring system.  General Information: The scale should reflect your current level of pain. Unless you are specifically asked for the level of your worst pain, or your average pain. If you are asked for one of these two, then it should be understood that it is over the past 24 hours.  Basic Activities of Daily Living (ADL): Personal hygiene, dressing, eating, transferring, and using restroom.  Instructions: Most patients tend to report their level of pain as a combination of two factors, their physical pain and their psychosocial pain. This last one is also known as "suffering" and it is reflection of how physical pain affects you socially and psychologically. From now on, report them separately. From this point on, when asked to report your pain level, report only your physical pain. Use the following table for reference.  Pain Clinic Pain Levels (0-5/10)  Pain Level Score  Description  No Pain 0   Mild pain 1 Nagging, annoying, but does not interfere with basic activities of daily living (ADL). Patients are able to eat, bathe, get dressed, toileting (being able to get on and off the toilet and  perform personal hygiene functions), transfer (move in and out of bed or a chair without assistance), and maintain continence (able to control bladder and bowel functions). Blood pressure and heart rate are unaffected. A normal heart rate for a healthy adult  ranges from 60 to 100 bpm (beats per minute).   Mild to moderate pain 2 Noticeable and distracting. Impossible to hide from other people. More frequent flare-ups. Still possible to adapt and function close to normal. It can be very annoying and may have occasional stronger flare-ups. With discipline, patients may get used to it and adapt.   Moderate pain 3 Interferes significantly with activities of daily living (ADL). It becomes difficult to feed, bathe, get dressed, get on and off the toilet or to perform personal hygiene functions. Difficult to get in and out of bed or a chair without assistance. Very distracting. With effort, it can be ignored when deeply involved in activities.   Moderately severe pain 4 Impossible to ignore for more than a few minutes. With effort, patients may still be able to manage work or participate in some social activities. Very difficult to concentrate. Signs of autonomic nervous system discharge are evident: dilated pupils (mydriasis); mild sweating (diaphoresis); sleep interference. Heart rate becomes elevated (>115 bpm). Diastolic blood pressure (lower number) rises above 100 mmHg. Patients find relief in laying down and not moving.   Severe pain 5 Intense and extremely unpleasant. Associated with frowning face and frequent crying. Pain overwhelms the senses.  Ability to do any activity or maintain social relationships becomes significantly limited. Conversation becomes difficult. Pacing back and forth is common, as getting into a comfortable position is nearly impossible. Pain wakes you up from deep sleep. Physical signs will be obvious: pupillary dilation; increased sweating; goosebumps; brisk reflexes; cold, clammy  hands and feet; nausea, vomiting or dry heaves; loss of appetite; significant sleep disturbance with inability to fall asleep or to remain asleep. When persistent, significant weight loss is observed due to the complete loss of appetite and sleep deprivation.  Blood pressure and heart rate becomes significantly elevated. Caution: If elevated blood pressure triggers a pounding headache associated with blurred vision, then the patient should immediately seek attention at an urgent or emergency care unit, as these may be signs of an impending stroke.    Emergency Department Pain Levels (6-10/10)  Emergency Room Pain 6 Severely limiting. Requires emergency care and should not be seen or managed at an outpatient pain management facility. Communication becomes difficult and requires great effort. Assistance to reach the emergency department may be required. Facial flushing and profuse sweating along with potentially dangerous increases in heart rate and blood pressure will be evident.   Distressing pain 7 Self-care is very difficult. Assistance is required to transport, or use restroom. Assistance to reach the emergency department will be required. Tasks requiring coordination, such as bathing and getting dressed become very difficult.   Disabling pain 8 Self-care is no longer possible. At this level, pain is disabling. The individual is unable to do even the most "basic" activities such as walking, eating, bathing, dressing, transferring to a bed, or toileting. Fine motor skills are lost. It is difficult to think clearly.   Incapacitating pain 9 Pain becomes incapacitating. Thought processing is no longer possible. Difficult to remember your own name. Control of movement and coordination are lost.   The worst pain imaginable 10 At this level, most patients pass out from pain. When this level is reached, collapse of the autonomic nervous system occurs, leading to a sudden drop in blood pressure and heart  rate. This in turn results in a temporary and dramatic drop in blood flow to the brain, leading to a loss of consciousness. Fainting is one of the body's self  defense mechanisms. Passing out puts the brain in a calmed state and causes it to shut down for a while, in order to begin the healing process.    Summary: 1. Refer to this scale when providing [place] with your pain level. 2. Be accurate and careful when reporting your pain level. This will help with your care. 3. Over-reporting your pain level will lead to loss of credibility. 4. Even a level of 1/10 means that there is pain and will be treated at our facility. 5. High, inaccurate reporting will be documented as "Symptom Exaggeration", leading to loss of credibility and suspicions of possible secondary gains such as obtaining more narcotics, or wanting to appear disabled, for fraudulent reasons. 6. Only pain levels of 5 or below will be seen at our facility. 7. Pain levels of 6 and above will be sent to the Emergency Department and the appointment cancelled. ____________________________________________________________________________________________

## 2017-10-09 NOTE — Progress Notes (Signed)
Nursing Pain Medication Assessment:  Safety precautions to be maintained throughout the outpatient stay will include: orient to surroundings, keep bed in low position, maintain call bell within reach at all times, provide assistance with transfer out of bed and ambulation.  Medication Inspection Compliance: Pill count conducted under aseptic conditions, in front of the patient. Neither the pills nor the bottle was removed from the patient's sight at any time. Once count was completed pills were immediately returned to the patient in their original bottle.  Medication: Oxycodone/APAP Pill/Patch Count: 0 of 120 pills remain Pill/Patch Appearance: Markings consistent with prescribed medication Bottle Appearance: Standard pharmacy container. Clearly labeled. Filled Date: 8 / 63 / 2019 Last Medication intake:  Today

## 2017-10-09 NOTE — Patient Instructions (Addendum)
____________________________________________________________________________________________  Medication Rules  Applies to: All patients receiving prescriptions (written or electronic).  Pharmacy of record: Pharmacy where electronic prescriptions will be sent. If written prescriptions are taken to a different pharmacy, please inform the nursing staff. The pharmacy listed in the electronic medical record should be the one where you would like electronic prescriptions to be sent.  Prescription refills: Only during scheduled appointments. Applies to both, written and electronic prescriptions.  NOTE: The following applies primarily to controlled substances (Opioid* Pain Medications).   Patient's responsibilities: 1. Pain Pills: Bring all pain pills to every appointment (except for procedure appointments). 2. Pill Bottles: Bring pills in original pharmacy bottle. Always bring newest bottle. Bring bottle, even if empty. 3. Medication refills: You are responsible for knowing and keeping track of what medications you need refilled. The day before your appointment, write a list of all prescriptions that need to be refilled. Bring that list to your appointment and give it to the admitting nurse. Prescriptions will be written only during appointments. If you forget a medication, it will not be "Called in", "Faxed", or "electronically sent". You will need to get another appointment to get these prescribed. 4. Prescription Accuracy: You are responsible for carefully inspecting your prescriptions before leaving our office. Have the discharge nurse carefully go over each prescription with you, before taking them home. Make sure that your name is accurately spelled, that your address is correct. Check the name and dose of your medication to make sure it is accurate. Check the number of pills, and the written instructions to make sure they are clear and accurate. Make sure that you are given enough medication to last  until your next medication refill appointment. 5. Taking Medication: Take medication as prescribed. Never take more pills than instructed. Never take medication more frequently than prescribed. Taking less pills or less frequently is permitted and encouraged, when it comes to controlled substances (written prescriptions).  6. Inform other Doctors: Always inform, all of your healthcare providers, of all the medications you take. 7. Pain Medication from other Providers: You are not allowed to accept any additional pain medication from any other Doctor or Healthcare provider. There are two exceptions to this rule. (see below) In the event that you require additional pain medication, you are responsible for notifying us, as stated below. 8. Medication Agreement: You are responsible for carefully reading and following our Medication Agreement. This must be signed before receiving any prescriptions from our practice. Safely store a copy of your signed Agreement. Violations to the Agreement will result in no further prescriptions. (Additional copies of our Medication Agreement are available upon request.) 9. Laws, Rules, & Regulations: All patients are expected to follow all Federal and State Laws, Statutes, Rules, & Regulations. Ignorance of the Laws does not constitute a valid excuse. The use of any illegal substances is prohibited. 10. Adopted CDC guidelines & recommendations: Target dosing levels will be at or below 60 MME/day. Use of benzodiazepines** is not recommended.  Exceptions: There are only two exceptions to the rule of not receiving pain medications from other Healthcare Providers. 1. Exception #1 (Emergencies): In the event of an emergency (i.e.: accident requiring emergency care), you are allowed to receive additional pain medication. However, you are responsible for: As soon as you are able, call our office [phone], at any time of the day or night, and leave a message stating your name, the  date and nature of the emergency, and the name and dose of the medication   prescribed. In the event that your call is answered by a member of our staff, make sure to document and save the date, time, and the name of the person that took your information.  2. Exception #2 (Planned Surgery): In the event that you are scheduled by another doctor or dentist to have any type of surgery or procedure, you are allowed (for a period no longer than 30 days), to receive additional pain medication, for the acute post-op pain. However, in this case, you are responsible for picking up a copy of our "Post-op Pain Management for Surgeons" handout, and giving it to your surgeon or dentist. This document is available at our office, and does not require an appointment to obtain it. Simply go to our office during business hours (Monday-Thursday from 8:00 AM to 4:00 PM) (Friday 8:00 AM to 12:00 Noon) or if you have a scheduled appointment with us, prior to your surgery, and ask for it by name. In addition, you will need to provide us with your name, name of your surgeon, type of surgery, and date of procedure or surgery.  *Opioid medications include: morphine, codeine, oxycodone, oxymorphone, hydrocodone, hydromorphone, meperidine, tramadol, tapentadol, buprenorphine, fentanyl, methadone. **Benzodiazepine medications include: diazepam (Valium), alprazolam (Xanax), clonazepam (Klonopine), lorazepam (Ativan), clorazepate (Tranxene), chlordiazepoxide (Librium), estazolam (Prosom), oxazepam (Serax), temazepam (Restoril), triazolam (Halcion) (Last updated: 03/27/2017) ____________________________________________________________________________________________   ____________________________________________________________________________________________  Pain Scale  Introduction: The pain score used by this practice is the Verbal Numerical Rating Scale (VNRS-11). This is an 11-point scale. It is for adults and children 10 years or  older. There are significant differences in how the pain score is reported, used, and applied. Forget everything you learned in the past and learn this scoring system.  General Information: The scale should reflect your current level of pain. Unless you are specifically asked for the level of your worst pain, or your average pain. If you are asked for one of these two, then it should be understood that it is over the past 24 hours.  Basic Activities of Daily Living (ADL): Personal hygiene, dressing, eating, transferring, and using restroom.  Instructions: Most patients tend to report their level of pain as a combination of two factors, their physical pain and their psychosocial pain. This last one is also known as "suffering" and it is reflection of how physical pain affects you socially and psychologically. From now on, report them separately. From this point on, when asked to report your pain level, report only your physical pain. Use the following table for reference.  Pain Clinic Pain Levels (0-5/10)  Pain Level Score  Description  No Pain 0   Mild pain 1 Nagging, annoying, but does not interfere with basic activities of daily living (ADL). Patients are able to eat, bathe, get dressed, toileting (being able to get on and off the toilet and perform personal hygiene functions), transfer (move in and out of bed or a chair without assistance), and maintain continence (able to control bladder and bowel functions). Blood pressure and heart rate are unaffected. A normal heart rate for a healthy adult ranges from 60 to 100 bpm (beats per minute).   Mild to moderate pain 2 Noticeable and distracting. Impossible to hide from other people. More frequent flare-ups. Still possible to adapt and function close to normal. It can be very annoying and may have occasional stronger flare-ups. With discipline, patients may get used to it and adapt.   Moderate pain 3 Interferes significantly with activities of daily  living (ADL).   It becomes difficult to feed, bathe, get dressed, get on and off the toilet or to perform personal hygiene functions. Difficult to get in and out of bed or a chair without assistance. Very distracting. With effort, it can be ignored when deeply involved in activities.   Moderately severe pain 4 Impossible to ignore for more than a few minutes. With effort, patients may still be able to manage work or participate in some social activities. Very difficult to concentrate. Signs of autonomic nervous system discharge are evident: dilated pupils (mydriasis); mild sweating (diaphoresis); sleep interference. Heart rate becomes elevated (>115 bpm). Diastolic blood pressure (lower number) rises above 100 mmHg. Patients find relief in laying down and not moving.   Severe pain 5 Intense and extremely unpleasant. Associated with frowning face and frequent crying. Pain overwhelms the senses.  Ability to do any activity or maintain social relationships becomes significantly limited. Conversation becomes difficult. Pacing back and forth is common, as getting into a comfortable position is nearly impossible. Pain wakes you up from deep sleep. Physical signs will be obvious: pupillary dilation; increased sweating; goosebumps; brisk reflexes; cold, clammy hands and feet; nausea, vomiting or dry heaves; loss of appetite; significant sleep disturbance with inability to fall asleep or to remain asleep. When persistent, significant weight loss is observed due to the complete loss of appetite and sleep deprivation.  Blood pressure and heart rate becomes significantly elevated. Caution: If elevated blood pressure triggers a pounding headache associated with blurred vision, then the patient should immediately seek attention at an urgent or emergency care unit, as these may be signs of an impending stroke.    Emergency Department Pain Levels (6-10/10)  Emergency Room Pain 6 Severely limiting. Requires emergency care  and should not be seen or managed at an outpatient pain management facility. Communication becomes difficult and requires great effort. Assistance to reach the emergency department may be required. Facial flushing and profuse sweating along with potentially dangerous increases in heart rate and blood pressure will be evident.   Distressing pain 7 Self-care is very difficult. Assistance is required to transport, or use restroom. Assistance to reach the emergency department will be required. Tasks requiring coordination, such as bathing and getting dressed become very difficult.   Disabling pain 8 Self-care is no longer possible. At this level, pain is disabling. The individual is unable to do even the most "basic" activities such as walking, eating, bathing, dressing, transferring to a bed, or toileting. Fine motor skills are lost. It is difficult to think clearly.   Incapacitating pain 9 Pain becomes incapacitating. Thought processing is no longer possible. Difficult to remember your own name. Control of movement and coordination are lost.   The worst pain imaginable 10 At this level, most patients pass out from pain. When this level is reached, collapse of the autonomic nervous system occurs, leading to a sudden drop in blood pressure and heart rate. This in turn results in a temporary and dramatic drop in blood flow to the brain, leading to a loss of consciousness. Fainting is one of the body's self defense mechanisms. Passing out puts the brain in a calmed state and causes it to shut down for a while, in order to begin the healing process.    Summary: 1. Refer to this scale when providing us with your pain level. 2. Be accurate and careful when reporting your pain level. This will help with your care. 3. Over-reporting your pain level will lead to loss of credibility. 4. Even   a level of 1/10 means that there is pain and will be treated at our facility. 5. High, inaccurate reporting will be  documented as "Symptom Exaggeration", leading to loss of credibility and suspicions of possible secondary gains such as obtaining more narcotics, or wanting to appear disabled, for fraudulent reasons. 6. Only pain levels of 5 or below will be seen at our facility. 7. Pain levels of 6 and above will be sent to the Emergency Department and the appointment cancelled. ____________________________________________________________________________________________    

## 2017-10-15 LAB — TOXASSURE SELECT 13 (MW), URINE

## 2018-01-06 ENCOUNTER — Encounter: Payer: Medicare Other | Admitting: Nurse Practitioner

## 2018-02-12 ENCOUNTER — Other Ambulatory Visit: Payer: Self-pay | Admitting: Family Medicine

## 2018-02-12 DIAGNOSIS — Z1231 Encounter for screening mammogram for malignant neoplasm of breast: Secondary | ICD-10-CM

## 2019-02-23 NOTE — Progress Notes (Signed)
Karen Coffin, MD   Chief Complaint  Patient presents with  . Vaginal Itching    irritation, no discharge/odor on/off for the past 2 months    HPI:      Ms. Karen Cervantes is a 64 y.o. No obstetric history on file. who LMP was No LMP recorded. Patient is postmenopausal., presents today for NP vaginal itching without d/c or odor, intermittently for the past 2 months. Was treated by PCP about 2 wks ago with diflucan and miconazole crm (caused burning) without sx relief. Pt using shower gel and occas dryer sheets. No scented wipes or bubble baths. Pt is still sex active, no vag dryness. No vaginal bleeding. S/p hyst due to uterine cancer. No hx of DM.    Patient Active Problem List   Diagnosis Date Noted  . Long term current use of opiate analgesic 03/27/2017  . Chronic pain syndrome 03/27/2017  . Pain medication agreement signed 09/03/2016  . S/P coronary artery stent placement   . Ischemic chest pain   . Ischemic heart disease due to coronary artery obstruction ([place]) 12/05/2014  . HTN (hypertension), benign 06/10/2014  . CAD (coronary artery disease)   . Dyslipidemia, goal LDL below 70   . Morbid obesity due to excess calories ([place])   . Seropositive rheumatoid arthritis of multiple sites ([place])   . Diastolic dysfunction   . NSTEMI (non-ST elevated myocardial infarction) ([place]) 05/28/2014  . Right cervical radiculopathy 08/03/2012  . Hidradenitis suppurativa 07/16/2012  . Lumbar spondylosis 07/16/2012    Past Surgical History:  Procedure Laterality Date  . CARDIAC CATHETERIZATION N/A 05/30/2014   Procedure: Left Heart Cath and Coronary Angiography;  Surgeon: Wellington Hampshire, MD;  Location: [facility];  Service: Cardiovascular;  Laterality: N/A;  . CARDIAC CATHETERIZATION N/A 05/30/2014   Procedure: Coronary Stent Intervention;  Surgeon: Wellington Hampshire, MD;  Location: [facility] [place] [facility];  Service: Cardiovascular;  Laterality: N/A;  . CARDIAC CATHETERIZATION N/A  01/02/2015   Procedure: Left Heart Cath and Coronary Angiography;  Surgeon: Wellington Hampshire, MD;  Location: [facility];  Service: Cardiovascular;  Laterality: N/A;  . CARDIAC CATHETERIZATION N/A 01/02/2015   Procedure: Coronary Stent Intervention;  Surgeon: Wellington Hampshire, MD;  Location: [facility];  Service: Cardiovascular;  Laterality: N/A;    Family History  Problem Relation Age of Onset  . Heart attack Father   . Heart disease Father   . Heart Problems Brother   . Heart Problems Brother   . Heart Problems Brother   . Breast cancer Neg Hx     Social History   Socioeconomic History  . Marital status: Married    Spouse name: Not on file  . Number of children: Not on file  . Years of education: Not on file  . Highest education level: Not on file  Occupational History  . Occupation: disabled  Tobacco Use  . Smoking status: Former Research scientist (life sciences)  . Smokeless tobacco: Never Used  Substance and Sexual Activity  . Alcohol use: No  . Drug use: No  . Sexual activity: Yes    Birth control/protection: None  Other Topics Concern  . Not on file  Social History Narrative  . Not on file   Social Determinants of Health   Financial Resource Strain:   . Difficulty of Paying Living Expenses: Not on file  Food Insecurity:   . Worried About Charity fundraiser in the Last Year: Not on file  .  Ran Out of Food in the Last Year: Not on file  Transportation Needs:   . Lack of Transportation (Medical): Not on file  . Lack of Transportation (Non-Medical): Not on file  Physical Activity:   . Days of Exercise per Week: Not on file  . Minutes of Exercise per Session: Not on file  Stress:   . Feeling of Stress : Not on file  Social Connections:   . Frequency of Communication with Friends and Family: Not on file  . Frequency of Social Gatherings with Friends and Family: Not on file  . Attends Religious Services: Not on file  . Active Member of Clubs or Organizations: Not on  file  . Attends Archivist Meetings: Not on file  . Marital Status: Not on file  Intimate Partner Violence:   . Fear of Current or Ex-Partner: Not on file  . Emotionally Abused: Not on file  . Physically Abused: Not on file  . Sexually Abused: Not on file    Outpatient Medications Prior to Visit  Medication Sig Dispense Refill  . aspirin EC 81 MG tablet Take 1 tablet (81 mg total) by mouth daily. 30 tablet 0  . atorvastatin (LIPITOR) 80 MG tablet Take 80 mg by mouth every evening.    . carvedilol (COREG) 12.5 MG tablet Take 1 tablet (12.5 mg total) by mouth 2 (two) times daily with a meal. 30 tablet 0  . cyclobenzaprine (FLEXERIL) 10 MG tablet Take 10 mg by mouth at bedtime.    Marland Kitchen ezetimibe (ZETIA) 10 MG tablet Take 10 mg by mouth daily.    Marland Kitchen FOLIC ACID PO Take 1 tablet by mouth every morning.     . gabapentin (NEURONTIN) 300 MG capsule Take 600 mg by mouth 2 (two) times daily.     Marland Kitchen lidocaine (XYLOCAINE) 2 % solution Use as directed 20 mLs in the mouth or throat as needed for mouth pain. 100 mL 0  . lisinopril (ZESTRIL) 20 MG tablet Take by mouth.    . megestrol (MEGACE) 40 MG tablet Take by mouth.    . Melatonin 10 MG CAPS Take by mouth.    . Multiple Vitamins-Minerals (MULTIVITAMIN PO) Take 1 tablet by mouth every morning.    . nitroGLYCERIN (NITROSTAT) 0.4 MG SL tablet Place 1 tablet (0.4 mg total) under the tongue every 5 (five) minutes as needed for chest pain. 25 tablet 3  . omeprazole (PRILOSEC) 20 MG capsule Take 20 mg by mouth 2 (two) times daily before a meal.    . oxyCODONE-acetaminophen (PERCOCET) 10-325 MG tablet Take by mouth.    Marland Kitchen alum & mag hydroxide-simeth (MAALOX MAX) 400-400-40 MG/5ML suspension Take 5 mLs by mouth every 6 (six) hours as needed for indigestion. 355 mL 0  . clopidogrel (PLAVIX) 75 MG tablet Take 1 tablet (75 mg total) by mouth daily. 30 tablet 0  . lisinopril (PRINIVIL,ZESTRIL) 10 MG tablet Take 1 tablet (10 mg total) by mouth daily. 30 tablet  0  . Melatonin 5 MG CAPS Take 10-30 mg by mouth at bedtime.      No facility-administered medications prior to visit.      ROS:  Review of Systems  Constitutional: Negative for fever.  Gastrointestinal: Negative for blood in stool, constipation, diarrhea, nausea and vomiting.  Genitourinary: Positive for vaginal pain. Negative for dyspareunia, dysuria, flank pain, frequency, hematuria, urgency, vaginal bleeding and vaginal discharge.  Musculoskeletal: Negative for back pain.  Skin: Negative for rash.   BREAST: No symptoms  OBJECTIVE:   Vitals:  BP 130/70   Ht 5\' 3"  (1.6 m)   Wt 215 lb (97.5 kg)   BMI 38.09 kg/m   Physical Exam Vitals reviewed.  Constitutional:      Appearance: She is well-developed.  Pulmonary:     Effort: Pulmonary effort is normal.  Genitourinary:    Pubic Area: No rash.      Labia:        Right: No rash, tenderness or lesion.        Left: No rash, tenderness or lesion.      Vagina: Normal. No vaginal discharge, erythema or tenderness.     Uterus: Absent. Not enlarged and not tender.      Adnexa: Right adnexa normal and left adnexa normal.       Right: No mass or tenderness.         Left: No mass or tenderness.         Comments: POST FOURCHETTE WITH ERYTHEMA/TENDERNESS AND EVID OF EXCORIATION/RUBBING. LT>RT Musculoskeletal:        General: Normal range of motion.     Cervical back: Normal range of motion.  Skin:    General: Skin is warm and dry.  Neurological:     General: No focal deficit present.     Mental Status: She is alert and oriented to person, place, and time.  Psychiatric:        Mood and Affect: Mood normal.        Behavior: Behavior normal.        Thought Content: Thought content normal.        Judgment: Judgment normal.     Results: Results for orders placed or performed in visit on 02/24/19 (from the past 24 hour(s))  POCT Wet Prep with KOH     Status: Normal   Collection Time: 02/24/19  9:39 AM  Result Value Ref  Range   Trichomonas, UA Negative    Clue Cells Wet Prep HPF POC neg    Epithelial Wet Prep HPF POC     Yeast Wet Prep HPF POC neg    Bacteria Wet Prep HPF POC     RBC Wet Prep HPF POC     WBC Wet Prep HPF POC     KOH Prep POC Negative Negative     Assessment/Plan: Vaginal itching - Plan: clotrimazole-betamethasone (LOTRISONE) cream, POCT Wet Prep with KOH; Net wet prep/pos exam and sx. Question itch-scratch vs fungal vs chem irritation. Rx lotrisone crm/cool compresses/don't scratch. Dove sensitive skin soap, line dry underwear. F/u prn.    Meds ordered this encounter  Medications  . clotrimazole-betamethasone (LOTRISONE) cream    Sig: Apply externally BID prn sx up to 2 wks    Dispense:  15 g    Refill:  0    Order Specific Question:   Supervising Provider    Answer:   Gae Dry [id number]      Return if symptoms worsen or fail to improve.  Raylene Carmickle B. Cassundra Mckeever, PA-C 02/24/2019 9:41 AM

## 2019-02-24 ENCOUNTER — Ambulatory Visit (INDEPENDENT_AMBULATORY_CARE_PROVIDER_SITE_OTHER): Payer: Medicare Other | Admitting: Obstetrics and Gynecology

## 2019-02-24 ENCOUNTER — Encounter: Payer: Self-pay | Admitting: Obstetrics and Gynecology

## 2019-02-24 ENCOUNTER — Other Ambulatory Visit: Payer: Self-pay

## 2019-02-24 VITALS — BP 130/70 | Ht 63.0 in | Wt 215.0 lb

## 2019-02-24 DIAGNOSIS — N898 Other specified noninflammatory disorders of vagina: Secondary | ICD-10-CM | POA: Diagnosis not present

## 2019-02-24 LAB — POCT WET PREP WITH KOH
Clue Cells Wet Prep HPF POC: NEGATIVE
KOH Prep POC: NEGATIVE
Trichomonas, UA: NEGATIVE
Yeast Wet Prep HPF POC: NEGATIVE

## 2019-02-24 MED ORDER — CLOTRIMAZOLE-BETAMETHASONE 1-0.05 % EX CREA: TOPICAL_CREAM | CUTANEOUS | 0 refills | Status: DC

## 2019-02-24 NOTE — Patient Instructions (Signed)
I value your feedback and entrusting us with your care. If you get a  patient survey, I would appreciate you taking the time to let us know about your experience today. Thank you!  As of January 07, 2019, your lab results will be released to your MyChart immediately, before I even have a chance to see them. Please give me time to review them and contact you if there are any abnormalities. Thank you for your patience.  

## 2019-03-22 NOTE — Progress Notes (Signed)
Karen Coffin, MD   Chief Complaint  Patient presents with  . Vaginal Itching    irritation, no discharge or odor sx are still there since last visit  . Urinary Tract Infection    burning when urinating    HPI:      Karen Cervantes is a 64 y.o. No obstetric history on file. who LMP was No LMP recorded. Patient is postmenopausal., presents today for vaginal irritation/itching, no odor/d/c for the past 3 months. Treated for same sx with lotrisone crm 02/24/19. Thought it was chemical vs fungal vs itch/scratch due to shower gels and occas dryer sheets. No sx relief with lotrisone crm. Had also been treated with nystatin and diflucan by PCP prior to me seeing her 1/21.Marland Kitchen  Pt wears poise pads at night due to urine leakage (has RA and slow to get to bathroom in time at night), bu this isn't new for her. No other pantyliners, scented products, no dryer sheets. Stopped shower gels.  Pt also with dysuria when urine hits area, but no other UTI sx.  Pt is post menopausal, no vag dryness.   Past Medical History:  Diagnosis Date  . CAD (coronary artery disease)    a. NSTEMI 04/2014; b. cardiac cath 05/30/2014: ost LAD 30%, mLAD 95% s/p PCI/DES, ost RCA 60%, pRCA 50%, dRCA 60%, EF >55% by echo;  c. 12/2014 NSTEMI/PCI: LM nl, LAD 30ost, patent mid stent, D1 small, D2 min dzs, D3 small, LCX min irregs, OM1 small, OM2 min irregs, OM3 70, RCA 60ost, 40p, 9m/63m/d, 70d (3.25x28 Xience Alpine DES), RPDA min irregs, EF 55-65%.  . Diastolic dysfunction    a. echo 04/2014: EF 123456, no WMA, diastolic dysfunction, no valvular abnormalities, normal RVSP;  b. 12/2014 EF 55-65% by LV gram.  . Gastric ulcer   . HLD (hyperlipidemia)   . Hypertension   . Myocardial infarction [facility]) may 2016, December 2016   stents placed each time  . Obesity   . RA (rheumatoid arthritis) ([place])   . Uterine cancer ([place]) 2019   at [facility]    Past Surgical History:  Procedure Laterality Date  . CARDIAC CATHETERIZATION N/A  05/30/2014   Procedure: Left Heart Cath and Coronary Angiography;  Surgeon: Wellington Hampshire, MD;  Location: [facility];  Service: Cardiovascular;  Laterality: N/A;  . CARDIAC CATHETERIZATION N/A 05/30/2014   Procedure: Coronary Stent Intervention;  Surgeon: Wellington Hampshire, MD;  Location: [facility];  Service: Cardiovascular;  Laterality: N/A;  . CARDIAC CATHETERIZATION N/A 01/02/2015   Procedure: Left Heart Cath and Coronary Angiography;  Surgeon: Wellington Hampshire, MD;  Location: [place] [facility];  Service: Cardiovascular;  Laterality: N/A;  . CARDIAC CATHETERIZATION N/A 01/02/2015   Procedure: Coronary Stent Intervention;  Surgeon: Wellington Hampshire, MD;  Location: [place] [facility];  Service: Cardiovascular;  Laterality: N/A;    Family History  Problem Relation Age of Onset  . Heart attack Father   . Heart disease Father   . Heart Problems Brother   . Heart Problems Brother   . Heart Problems Brother   . Breast cancer Neg Hx     Social History   Socioeconomic History  . Marital status: Married    Spouse name: Not on file  . Number of children: Not on file  . Years of education: Not on file  . Highest education level: Not on file  Occupational History  . Occupation: disabled  Tobacco Use  .  Smoking status: Former Research scientist (life sciences)  . Smokeless tobacco: Never Used  Substance and Sexual Activity  . Alcohol use: No  . Drug use: No  . Sexual activity: Not Currently    Birth control/protection: None  Other Topics Concern  . Not on file  Social History Narrative  . Not on file   Social Determinants of Health   Financial Resource Strain:   . Difficulty of Paying Living Expenses: Not on file  Food Insecurity:   . Worried About Charity fundraiser in the Last Year: Not on file  . Ran Out of Food in the Last Year: Not on file  Transportation Needs:   . Lack of Transportation (Medical): Not on file  . Lack of Transportation (Non-Medical): Not on file  Physical  Activity:   . Days of Exercise per Week: Not on file  . Minutes of Exercise per Session: Not on file  Stress:   . Feeling of Stress : Not on file  Social Connections:   . Frequency of Communication with Friends and Family: Not on file  . Frequency of Social Gatherings with Friends and Family: Not on file  . Attends Religious Services: Not on file  . Active Member of Clubs or Organizations: Not on file  . Attends Archivist Meetings: Not on file  . Marital Status: Not on file  Intimate Partner Violence:   . Fear of Current or Ex-Partner: Not on file  . Emotionally Abused: Not on file  . Physically Abused: Not on file  . Sexually Abused: Not on file    Outpatient Medications Prior to Visit  Medication Sig Dispense Refill  . aspirin EC 81 MG tablet Take 1 tablet (81 mg total) by mouth daily. 30 tablet 0  . atorvastatin (LIPITOR) 80 MG tablet Take 80 mg by mouth every evening.    . carvedilol (COREG) 12.5 MG tablet Take 1 tablet (12.5 mg total) by mouth 2 (two) times daily with a meal. 30 tablet 0  . clotrimazole-betamethasone (LOTRISONE) cream Apply externally BID prn sx up to 2 wks 15 g 0  . cyclobenzaprine (FLEXERIL) 10 MG tablet Take 10 mg by mouth at bedtime.    Marland Kitchen ezetimibe (ZETIA) 10 MG tablet Take 10 mg by mouth daily.    Marland Kitchen FOLIC ACID PO Take 1 tablet by mouth every morning.     . gabapentin (NEURONTIN) 300 MG capsule Take 600 mg by mouth 2 (two) times daily.     Marland Kitchen lidocaine (XYLOCAINE) 2 % solution Use as directed 20 mLs in the mouth or throat as needed for mouth pain. 100 mL 0  . lisinopril (ZESTRIL) 20 MG tablet Take by mouth.    . megestrol (MEGACE) 40 MG tablet Take by mouth.    . Melatonin 10 MG CAPS Take by mouth.    . Multiple Vitamins-Minerals (MULTIVITAMIN PO) Take 1 tablet by mouth every morning.    . nitroGLYCERIN (NITROSTAT) 0.4 MG SL tablet Place 1 tablet (0.4 mg total) under the tongue every 5 (five) minutes as needed for chest pain. 25 tablet 3  .  omeprazole (PRILOSEC) 20 MG capsule Take 20 mg by mouth 2 (two) times daily before a meal.    . oxyCODONE-acetaminophen (PERCOCET) 10-325 MG tablet Take by mouth.     No facility-administered medications prior to visit.      ROS:  Review of Systems  Constitutional: Negative for fever.  Gastrointestinal: Negative for blood in stool, constipation, diarrhea, nausea and vomiting.  Genitourinary: Positive  for dysuria and vaginal pain. Negative for dyspareunia, flank pain, frequency, hematuria, urgency, vaginal bleeding and vaginal discharge.  Musculoskeletal: Negative for back pain.  Skin: Negative for rash.   BREAST: No symptoms   OBJECTIVE:   Vitals:  BP 130/90   Ht 5\' 3"  (1.6 m)   Wt 215 lb (97.5 kg)   BMI 38.09 kg/m   Physical Exam Vitals reviewed.  Constitutional:      Appearance: Karen Cervantes is well-developed.  Pulmonary:     Effort: Pulmonary effort is normal.  Genitourinary:   Musculoskeletal:        General: Normal range of motion.     Cervical back: Normal range of motion.  Skin:    General: Skin is warm and dry.  Neurological:     General: No focal deficit present.     Mental Status: Karen Cervantes is alert and oriented to person, place, and time.     Cranial Nerves: No cranial nerve deficit.  Psychiatric:        Mood and Affect: Mood normal.        Behavior: Behavior normal.        Thought Content: Thought content normal.        Judgment: Judgment normal.     Assessment/Plan: Chronic vaginitis--Ext. Looks irritated. Most likely not fungal given sx persistence with lotrisone crm/failed diflucan and nystatin. Could be burn from urine, complicated by vaginal atrophy due to menopause. 1 sample prem vag crm. Use 1/2 pea size ext in AM for 7 days, apply A&D oint QHS before wearing poise. Will refer to derm if sx persist.     Return if symptoms worsen or fail to improve.  Etter Royall B. Daziya Redmond, PA-C 03/23/2019 5:05 PM

## 2019-03-23 ENCOUNTER — Ambulatory Visit (INDEPENDENT_AMBULATORY_CARE_PROVIDER_SITE_OTHER): Payer: Medicare Other | Admitting: Obstetrics and Gynecology

## 2019-03-23 ENCOUNTER — Encounter: Payer: Self-pay | Admitting: Obstetrics and Gynecology

## 2019-03-23 ENCOUNTER — Other Ambulatory Visit: Payer: Self-pay

## 2019-03-23 VITALS — BP 130/90 | Ht 63.0 in | Wt 215.0 lb

## 2019-03-23 DIAGNOSIS — N898 Other specified noninflammatory disorders of vagina: Secondary | ICD-10-CM

## 2019-03-23 DIAGNOSIS — R300 Dysuria: Secondary | ICD-10-CM | POA: Diagnosis not present

## 2019-03-23 DIAGNOSIS — N761 Subacute and chronic vaginitis: Secondary | ICD-10-CM

## 2019-03-23 NOTE — Patient Instructions (Signed)
I value your feedback and entrusting us with your care. If you get a [place] patient survey, I would appreciate you taking the time to let us know about your experience today. Thank you!  As of January 07, 2019, your lab results will be released to your MyChart immediately, before I even have a chance to see them. Please give me time to review them and contact you if there are any abnormalities. Thank you for your patience.  

## 2020-05-01 ENCOUNTER — Other Ambulatory Visit: Payer: Self-pay | Admitting: Family Medicine

## 2020-05-01 DIAGNOSIS — Z1231 Encounter for screening mammogram for malignant neoplasm of breast: Secondary | ICD-10-CM

## 2020-05-03 ENCOUNTER — Other Ambulatory Visit: Payer: Self-pay | Admitting: Surgery

## 2020-05-08 ENCOUNTER — Other Ambulatory Visit: Payer: Self-pay | Admitting: Surgery

## 2020-05-09 ENCOUNTER — Other Ambulatory Visit: Payer: Self-pay

## 2020-05-09 ENCOUNTER — Other Ambulatory Visit
Admission: RE | Admit: 2020-05-09 | Discharge: 2020-05-09 | Disposition: A | Discharge status: Home / Self Care | Payer: Medicare Other | Source: Ambulatory Visit | Attending: Surgery | Admitting: Surgery

## 2020-05-09 DIAGNOSIS — I4510 Unspecified right bundle-branch block: Secondary | ICD-10-CM

## 2020-05-09 DIAGNOSIS — Z01818 Encounter for other preprocedural examination: Secondary | ICD-10-CM | POA: Insufficient documentation

## 2020-05-09 HISTORY — DX: Unspecified right bundle-branch block: I45.10

## 2020-05-09 HISTORY — DX: Gastro-esophageal reflux disease without esophagitis: K21.9

## 2020-05-09 HISTORY — DX: Type 2 diabetes mellitus without complications: E11.9

## 2020-05-09 HISTORY — DX: Other specified postprocedural states: Z98.890

## 2020-05-09 HISTORY — DX: Nausea with vomiting, unspecified: R11.2

## 2020-05-09 LAB — TYPE AND SCREEN
ABO/RH(D): O POS
Antibody Screen: NEGATIVE

## 2020-05-09 LAB — CBC WITH DIFFERENTIAL/PLATELET
Abs Immature Granulocytes: 0.04 10*3/uL (ref 0.00–0.07)
Basophils Absolute: 0.1 10*3/uL (ref 0.0–0.1)
Basophils Relative: 1 %
Eosinophils Absolute: 0.3 10*3/uL (ref 0.0–0.5)
Eosinophils Relative: 2 %
HCT: 38.5 % (ref 36.0–46.0)
Hemoglobin: 12.7 g/dL (ref 12.0–15.0)
Immature Granulocytes: 0 %
Lymphocytes Relative: 33 %
Lymphs Abs: 4.1 10*3/uL — ABNORMAL HIGH (ref 0.7–4.0)
MCH: 30.3 pg (ref 26.0–34.0)
MCHC: 33 g/dL (ref 30.0–36.0)
MCV: 91.9 fL (ref 80.0–100.0)
Monocytes Absolute: 0.9 10*3/uL (ref 0.1–1.0)
Monocytes Relative: 7 %
Neutro Abs: 7.1 10*3/uL (ref 1.7–7.7)
Neutrophils Relative %: 57 %
Platelets: 397 10*3/uL (ref 150–400)
RBC: 4.19 MIL/uL (ref 3.87–5.11)
RDW: 12.7 % (ref 11.5–15.5)
WBC: 12.5 10*3/uL — ABNORMAL HIGH (ref 4.0–10.5)
nRBC: 0 % (ref 0.0–0.2)

## 2020-05-09 LAB — URINALYSIS, ROUTINE W REFLEX MICROSCOPIC
Bilirubin Urine: NEGATIVE
Glucose, UA: NEGATIVE mg/dL
Hgb urine dipstick: NEGATIVE
Ketones, ur: NEGATIVE mg/dL
Nitrite: NEGATIVE
Protein, ur: NEGATIVE mg/dL
Specific Gravity, Urine: 1.02 (ref 1.005–1.030)
pH: 5 (ref 5.0–8.0)

## 2020-05-09 LAB — COMPREHENSIVE METABOLIC PANEL
ALT: 13 U/L (ref 0–44)
AST: 20 U/L (ref 15–41)
Albumin: 4.3 g/dL (ref 3.5–5.0)
Alkaline Phosphatase: 76 U/L (ref 38–126)
Anion gap: 9 (ref 5–15)
BUN: 8 mg/dL (ref 8–23)
CO2: 25 mmol/L (ref 22–32)
Calcium: 8.9 mg/dL (ref 8.9–10.3)
Chloride: 104 mmol/L (ref 98–111)
Creatinine, Ser: 0.82 mg/dL (ref 0.44–1.00)
GFR, Estimated: >60 mL/min (ref >60)
Glucose, Bld: 112 mg/dL — ABNORMAL HIGH (ref 70–99)
Potassium: 4.2 mmol/L (ref 3.5–5.1)
Sodium: 138 mmol/L (ref 135–145)
Total Bilirubin: 0.5 mg/dL (ref 0.3–1.2)
Total Protein: 7.5 g/dL (ref 6.5–8.1)

## 2020-05-09 LAB — HEMOGLOBIN A1C
Hgb A1c MFr Bld: 6.5 % — ABNORMAL HIGH (ref 4.8–5.6)
Mean Plasma Glucose: 139.85 mg/dL

## 2020-05-09 LAB — SURGICAL PCR SCREEN
MRSA, PCR: NEGATIVE
Staphylococcus aureus: NEGATIVE

## 2020-05-09 NOTE — Patient Instructions (Signed)
Your procedure is scheduled on: 05/16/20 Report to [place]. To find out your arrival time please call [phone] between 1PM - 3PM on 05/15/20.  Remember: Instructions that are not followed completely may result in serious medical risk, up to and including death, or upon the discretion of your surgeon and anesthesiologist your surgery may need to be rescheduled.     _X__ 1. Do not eat food after midnight the night before your procedure.                 No gum chewing or hard candies. You may have clear liquids up until 2 hours before arriving to surgery                  Diabetics water only  __X__2.  On the morning of surgery brush your teeth with toothpaste and water, you                 may rinse your mouth with mouthwash if you wish.  Do not swallow any              toothpaste of mouthwash.     _X__ 3.  No Alcohol for 24 hours before or after surgery.   _X__ 4.  Do Not Smoke or use e-cigarettes For 24 Hours Prior to Your Surgery.                 Do not use any chewable tobacco products for at least 6 hours prior to                 surgery.  ____  5.  Bring all medications with you on the day of surgery if instructed.   __X__  6.  Notify your doctor if there is any change in your medical condition      (cold, fever, infections).     Do not wear jewelry, make-up, hairpins, clips or nail polish. Do not wear lotions, powders, or perfumes.  Do not shave 48 hours prior to surgery. Men may shave face and neck. Do not bring valuables to the hospital.    [facility] is not responsible for any belongings or valuables.  Contacts, dentures/partials or body piercings may not be worn into surgery. Bring a case for your contacts, glasses or hearing aids, a denture cup will be supplied. Leave your suitcase in the car. After surgery it may be brought to your room. For patients admitted to the hospital, discharge time is determined by  your treatment team.   Patients discharged the day of surgery will not be allowed to drive home.   Please read over the following fact sheets that you were given:   MRSA Information, CHG soap, Incentive Spirometer  __X__ Take these medicines the morning of surgery with A SIP OF WATER:    1. carvedilol (COREG) 12.5 MG tablet  2. ezetimibe (ZETIA) 10 MG tablet  3. gabapentin (NEURONTIN) 900 MG capsule  4. omeprazole (PRILOSEC) 20 MG capsule  5. Oxycodone if needed  6.  ____ Fleet Enema (as directed)   __X__ Use CHG Soap/SAGE wipes as directed  ____ Use inhalers on the day of surgery  ____ Stop metformin/Janumet/Farxiga 2 days prior to surgery    ____ Take 1/2 of usual insulin dose the night before surgery. No insulin the morning          of surgery.   ____ Stop Blood Thinners Coumadin/Plavix/Xarelto/Pleta/Pradaxa/Eliquis/Effient/Aspirin  on  Or contact your Surgeon, Cardiologist or Medical Doctor regarding  ability to stop your blood thinners  __X__ Stop Anti-inflammatories 7 days before surgery such as Advil, Ibuprofen, Motrin,  BC or Goodies Powder, Naprosyn, Naproxen, Aleve    __X__ Stop all herbal supplements, fish oil or vitamin E until after surgery.    ____ Bring C-Pap to the hospital.

## 2020-05-10 NOTE — Progress Notes (Signed)
  [facility] Perioperative Services: Pre-Admission/Anesthesia Testing  Abnormal Lab Notification   Date: 05/10/20  Name: Karen Cervantes MRN:   [id number]  Re: Abnormal labs noted during PAT appointment   Provider(s) Notified: No att. providers found Notification mode: Routed and/or faxed via [facility] LAB VALUE(S): Lab Results  Component Value Date   COLORURINE YELLOW (A) 05/09/2020   APPEARANCEUR HAZY (A) 05/09/2020   LABSPEC 1.020 05/09/2020   PHURINE 5.0 05/09/2020   GLUCOSEU NEGATIVE 05/09/2020   HGBUR NEGATIVE 05/09/2020   BILIRUBINUR NEGATIVE 05/09/2020   KETONESUR NEGATIVE 05/09/2020   PROTEINUR NEGATIVE 05/09/2020   NITRITE NEGATIVE 05/09/2020   LEUKOCYTESUR SMALL (A) 05/09/2020   EPIU 6-10 05/09/2020   WBCU 21-50 05/09/2020   RBCU 0-5 05/09/2020   BACTERIA RARE (A) 05/09/2020   Lab Results  Component Value Date   CULT >=100,000 COLONIES/mL GRAM NEGATIVE RODS (A) 05/09/2020   Notes:  Patient scheduled for TOTAL KNEE ARTHROPLASTY (Left Knee) on 05/16/2020.  UA performed in PAT consistent with/concerning for infection.  . Mild leukocytosis noted on CBC; WBC 12.5 K/uL . Renal function normal. Estimated Creatinine Clearance: 74.7 mL/min (by C-G formula based on SCr of 0.82 mg/dL). . Urine C&S added to assess for pathogenically significant growth; see above. Sensitivities pending.   Will forward UA, and subsequent C&S results, to attending surgeon for review and Tx as deemed appropriate. This is a Community education officer; no formal response is required.  Honor Loh, MSN, APRN, FNP-C, CEN [facility]  Peri-operative Services Nurse Practitioner Phone: [phone] Fax: [phone] 05/10/20 12:34 PM

## 2020-05-11 LAB — URINE CULTURE: Culture: >=100000 — AB

## 2020-05-12 ENCOUNTER — Other Ambulatory Visit: Payer: Self-pay

## 2020-05-12 ENCOUNTER — Other Ambulatory Visit
Admission: RE | Admit: 2020-05-12 | Discharge: 2020-05-12 | Disposition: A | Discharge status: Home / Self Care | Payer: Medicare Other | Source: Ambulatory Visit | Attending: Surgery | Admitting: Surgery

## 2020-05-12 DIAGNOSIS — Z20822 Contact with and (suspected) exposure to covid-19: Secondary | ICD-10-CM | POA: Diagnosis not present

## 2020-05-12 DIAGNOSIS — Z01812 Encounter for preprocedural laboratory examination: Secondary | ICD-10-CM | POA: Diagnosis present

## 2020-05-12 LAB — SARS CORONAVIRUS 2 (TAT 6-24 HRS): SARS Coronavirus 2: NEGATIVE

## 2020-05-16 ENCOUNTER — Ambulatory Visit: Payer: Medicare Other | Admitting: Urgent Care

## 2020-05-16 ENCOUNTER — Ambulatory Visit
Admission: RE | Admit: 2020-05-16 | Discharge: 2020-05-16 | Disposition: A | Discharge status: Home / Self Care | Payer: Medicare Other | Attending: Surgery | Admitting: Surgery

## 2020-05-16 ENCOUNTER — Other Ambulatory Visit: Payer: Self-pay

## 2020-05-16 ENCOUNTER — Encounter
Admission: RE | Disposition: A | Discharge status: Home / Self Care | Payer: Self-pay | Source: Home / Self Care | Attending: Surgery

## 2020-05-16 ENCOUNTER — Encounter: Payer: Self-pay | Admitting: Surgery

## 2020-05-16 ENCOUNTER — Ambulatory Visit: Payer: Medicare Other

## 2020-05-16 DIAGNOSIS — E119 Type 2 diabetes mellitus without complications: Secondary | ICD-10-CM | POA: Insufficient documentation

## 2020-05-16 DIAGNOSIS — I10 Essential (primary) hypertension: Secondary | ICD-10-CM | POA: Insufficient documentation

## 2020-05-16 DIAGNOSIS — M1712 Unilateral primary osteoarthritis, left knee: Secondary | ICD-10-CM | POA: Insufficient documentation

## 2020-05-16 DIAGNOSIS — Z888 Allergy status to other drugs, medicaments and biological substances status: Secondary | ICD-10-CM | POA: Diagnosis not present

## 2020-05-16 DIAGNOSIS — Z87891 Personal history of nicotine dependence: Secondary | ICD-10-CM | POA: Insufficient documentation

## 2020-05-16 DIAGNOSIS — M4800 Spinal stenosis, site unspecified: Secondary | ICD-10-CM | POA: Insufficient documentation

## 2020-05-16 DIAGNOSIS — I2510 Atherosclerotic heart disease of native coronary artery without angina pectoris: Secondary | ICD-10-CM | POA: Diagnosis not present

## 2020-05-16 DIAGNOSIS — Z7982 Long term (current) use of aspirin: Secondary | ICD-10-CM | POA: Diagnosis not present

## 2020-05-16 DIAGNOSIS — Z6835 Body mass index (BMI) 35.0-35.9, adult: Secondary | ICD-10-CM | POA: Diagnosis not present

## 2020-05-16 DIAGNOSIS — Z79899 Other long term (current) drug therapy: Secondary | ICD-10-CM | POA: Diagnosis not present

## 2020-05-16 DIAGNOSIS — M059 Rheumatoid arthritis with rheumatoid factor, unspecified: Secondary | ICD-10-CM | POA: Diagnosis not present

## 2020-05-16 DIAGNOSIS — Z7902 Long term (current) use of antithrombotics/antiplatelets: Secondary | ICD-10-CM | POA: Diagnosis not present

## 2020-05-16 DIAGNOSIS — Z96652 Presence of left artificial knee joint: Secondary | ICD-10-CM

## 2020-05-16 DIAGNOSIS — I252 Old myocardial infarction: Secondary | ICD-10-CM | POA: Insufficient documentation

## 2020-05-16 HISTORY — PX: TOTAL KNEE ARTHROPLASTY: SHX125

## 2020-05-16 LAB — GLUCOSE, CAPILLARY
Glucose-Capillary: 128 mg/dL — ABNORMAL HIGH (ref 70–99)
Glucose-Capillary: 124 mg/dL — ABNORMAL HIGH (ref 70–99)

## 2020-05-16 SURGERY — ARTHROPLASTY, KNEE, TOTAL
Anesthesia: Spinal | Site: Knee | Laterality: Left

## 2020-05-16 MED ORDER — ONDANSETRON HCL 4 MG/2ML IJ SOLN
INTRAMUSCULAR | Status: DC | PRN
  Administered 2020-05-16: 4 mg via INTRAVENOUS

## 2020-05-16 MED ORDER — TRANEXAMIC ACID 1000 MG/10ML IV SOLN
INTRAVENOUS | Status: DC | PRN
  Administered 2020-05-16: 1000 mg via TOPICAL

## 2020-05-16 MED ORDER — SODIUM CHLORIDE FLUSH 0.9 % IV SOLN
INTRAVENOUS | Status: AC
  Filled 2020-05-16: qty 40

## 2020-05-16 MED ORDER — ACETAMINOPHEN 10 MG/ML IV SOLN
INTRAVENOUS | Status: AC
  Filled 2020-05-16: qty 100

## 2020-05-16 MED ORDER — BUPIVACAINE-EPINEPHRINE (PF) 0.5% -1:200000 IJ SOLN
INTRAMUSCULAR | Status: AC
  Filled 2020-05-16: qty 30

## 2020-05-16 MED ORDER — FENTANYL CITRATE (PF) 100 MCG/2ML IJ SOLN
INTRAMUSCULAR | Status: AC
  Filled 2020-05-16: qty 2

## 2020-05-16 MED ORDER — PHENYLEPHRINE HCL (PRESSORS) 10 MG/ML IV SOLN
INTRAVENOUS | Status: AC
  Filled 2020-05-16: qty 1

## 2020-05-16 MED ORDER — BUPIVACAINE-EPINEPHRINE (PF) 0.5% -1:200000 IJ SOLN
INTRAMUSCULAR | Status: DC | PRN
  Administered 2020-05-16: 30 mL via PERINEURAL

## 2020-05-16 MED ORDER — MIDAZOLAM HCL 2 MG/2ML IJ SOLN
INTRAMUSCULAR | Status: AC
  Filled 2020-05-16: qty 2

## 2020-05-16 MED ORDER — FENTANYL CITRATE (PF) 100 MCG/2ML IJ SOLN
INTRAMUSCULAR | Status: AC
  Administered 2020-05-16: 25 ug via INTRAVENOUS
  Filled 2020-05-16: qty 2

## 2020-05-16 MED ORDER — ONDANSETRON HCL 4 MG PO TABS: 4.0000 mg | ORAL_TABLET | Freq: Four times a day (QID) | ORAL | Status: DC | PRN

## 2020-05-16 MED ORDER — SODIUM CHLORIDE 0.9 % IV SOLN
INTRAVENOUS | Status: DC | PRN
  Administered 2020-05-16: 60 mL

## 2020-05-16 MED ORDER — FENTANYL CITRATE (PF) 100 MCG/2ML IJ SOLN
25.0000 ug | INTRAMUSCULAR | Status: DC | PRN
  Administered 2020-05-16 (×3): 25 ug via INTRAVENOUS

## 2020-05-16 MED ORDER — ACETAMINOPHEN 10 MG/ML IV SOLN
INTRAVENOUS | Status: DC | PRN
  Administered 2020-05-16: 1000 mg via INTRAVENOUS

## 2020-05-16 MED ORDER — CHLORHEXIDINE GLUCONATE 0.12 % MT SOLN: 15.0000 mL | Freq: Once | OROMUCOSAL | Status: AC

## 2020-05-16 MED ORDER — SODIUM CHLORIDE 0.9 % BOLUS PEDS
250.0000 mL | Freq: Once | INTRAVENOUS | Status: AC
  Administered 2020-05-16: 250 mL via INTRAVENOUS

## 2020-05-16 MED ORDER — ONDANSETRON HCL 4 MG/2ML IJ SOLN
INTRAMUSCULAR | Status: AC
  Filled 2020-05-16: qty 2

## 2020-05-16 MED ORDER — PROPOFOL 500 MG/50ML IV EMUL
INTRAVENOUS | Status: DC | PRN
  Administered 2020-05-16: 65 ug/kg/min via INTRAVENOUS

## 2020-05-16 MED ORDER — ACETAMINOPHEN 500 MG PO TABS
ORAL_TABLET | ORAL | Status: AC
  Filled 2020-05-16: qty 2

## 2020-05-16 MED ORDER — METOCLOPRAMIDE HCL 10 MG PO TABS
5.0000 mg | ORAL_TABLET | Freq: Three times a day (TID) | ORAL | Status: DC | PRN
Start: 2020-05-16 — End: 2020-05-16

## 2020-05-16 MED ORDER — ACETAMINOPHEN 500 MG PO TABS
1000.0000 mg | ORAL_TABLET | Freq: Four times a day (QID) | ORAL | Status: DC
  Administered 2020-05-16: 1000 mg via ORAL

## 2020-05-16 MED ORDER — CHLORHEXIDINE GLUCONATE 0.12 % MT SOLN
OROMUCOSAL | Status: AC
  Administered 2020-05-16: 15 mL via OROMUCOSAL
  Filled 2020-05-16: qty 15

## 2020-05-16 MED ORDER — KETOROLAC TROMETHAMINE 30 MG/ML IJ SOLN: 30.0000 mg | Freq: Once | INTRAMUSCULAR | Status: AC

## 2020-05-16 MED ORDER — METOCLOPRAMIDE HCL 5 MG/ML IJ SOLN
5.0000 mg | Freq: Three times a day (TID) | INTRAMUSCULAR | Status: DC | PRN
Start: 2020-05-16 — End: 2020-05-16

## 2020-05-16 MED ORDER — ONDANSETRON HCL 4 MG/2ML IJ SOLN: 4.0000 mg | Freq: Once | INTRAMUSCULAR | Status: DC | PRN

## 2020-05-16 MED ORDER — SODIUM CHLORIDE 0.9 % IV SOLN
INTRAVENOUS | Status: DC
  Administered 2020-05-16: 250 mL via INTRAVENOUS

## 2020-05-16 MED ORDER — CEFAZOLIN SODIUM-DEXTROSE 2-4 GM/100ML-% IV SOLN
INTRAVENOUS | Status: AC
  Filled 2020-05-16: qty 100

## 2020-05-16 MED ORDER — PROPOFOL 1000 MG/100ML IV EMUL
INTRAVENOUS | Status: AC
  Filled 2020-05-16: qty 100

## 2020-05-16 MED ORDER — EPHEDRINE 5 MG/ML INJ
INTRAVENOUS | Status: AC
  Filled 2020-05-16: qty 10

## 2020-05-16 MED ORDER — KETOROLAC TROMETHAMINE 30 MG/ML IJ SOLN
INTRAMUSCULAR | Status: AC
  Filled 2020-05-16: qty 1

## 2020-05-16 MED ORDER — SODIUM CHLORIDE 0.9 % IV SOLN: INTRAVENOUS | Status: DC

## 2020-05-16 MED ORDER — MIDAZOLAM HCL 5 MG/5ML IJ SOLN
INTRAMUSCULAR | Status: DC | PRN
  Administered 2020-05-16 (×2): 1 mg via INTRAVENOUS

## 2020-05-16 MED ORDER — CEFAZOLIN SODIUM-DEXTROSE 2-4 GM/100ML-% IV SOLN
INTRAVENOUS | Status: AC
  Administered 2020-05-16: 2 g via INTRAVENOUS
  Filled 2020-05-16: qty 100

## 2020-05-16 MED ORDER — CEFAZOLIN SODIUM-DEXTROSE 2-4 GM/100ML-% IV SOLN
2.0000 g | INTRAVENOUS | Status: AC
  Administered 2020-05-16: 2 g via INTRAVENOUS

## 2020-05-16 MED ORDER — OXYCODONE HCL 5 MG PO TABS: 5.0000 mg | ORAL_TABLET | ORAL | 0 refills | Status: DC | PRN

## 2020-05-16 MED ORDER — ORAL CARE MOUTH RINSE: 15.0000 mL | Freq: Once | OROMUCOSAL | Status: AC

## 2020-05-16 MED ORDER — SODIUM CHLORIDE 0.9 % IV SOLN
INTRAVENOUS | Status: DC | PRN
  Administered 2020-05-16: 25 ug/min via INTRAVENOUS

## 2020-05-16 MED ORDER — TRANEXAMIC ACID 1000 MG/10ML IV SOLN
INTRAVENOUS | Status: AC
  Filled 2020-05-16: qty 10

## 2020-05-16 MED ORDER — CEFAZOLIN SODIUM-DEXTROSE 2-4 GM/100ML-% IV SOLN: 2.0000 g | Freq: Four times a day (QID) | INTRAVENOUS | Status: DC

## 2020-05-16 MED ORDER — APIXABAN 2.5 MG PO TABS: 2.5000 mg | ORAL_TABLET | Freq: Two times a day (BID) | ORAL | 0 refills | Status: DC

## 2020-05-16 MED ORDER — KETOROLAC TROMETHAMINE 15 MG/ML IJ SOLN
15.0000 mg | Freq: Four times a day (QID) | INTRAMUSCULAR | Status: DC
  Administered 2020-05-16: 15 mg via INTRAVENOUS

## 2020-05-16 MED ORDER — OXYCODONE HCL 5 MG PO TABS
10.0000 mg | ORAL_TABLET | ORAL | Status: DC | PRN
  Administered 2020-05-16: 10 mg via ORAL

## 2020-05-16 MED ORDER — BUPIVACAINE HCL (PF) 0.5 % IJ SOLN
INTRAMUSCULAR | Status: AC
  Filled 2020-05-16: qty 10

## 2020-05-16 MED ORDER — ONDANSETRON HCL 4 MG/2ML IJ SOLN: 4.0000 mg | Freq: Four times a day (QID) | INTRAMUSCULAR | Status: DC | PRN

## 2020-05-16 MED ORDER — BUPIVACAINE LIPOSOME 1.3 % IJ SUSP
INTRAMUSCULAR | Status: AC
  Filled 2020-05-16: qty 20

## 2020-05-16 MED ORDER — EPHEDRINE SULFATE 50 MG/ML IJ SOLN
INTRAMUSCULAR | Status: DC | PRN
  Administered 2020-05-16: 5 mg via INTRAVENOUS

## 2020-05-16 MED ORDER — BUPIVACAINE HCL (PF) 0.5 % IJ SOLN
INTRAMUSCULAR | Status: DC | PRN
  Administered 2020-05-16: 2.8 mL

## 2020-05-16 MED ORDER — KETOROLAC TROMETHAMINE 30 MG/ML IJ SOLN
INTRAMUSCULAR | Status: AC
  Administered 2020-05-16: 30 mg via INTRAVENOUS
  Filled 2020-05-16: qty 1

## 2020-05-16 MED ORDER — OXYCODONE HCL 5 MG PO TABS
ORAL_TABLET | ORAL | Status: AC
  Filled 2020-05-16: qty 2

## 2020-05-16 SURGICAL SUPPLY — 65 items
APL PRP STRL LF DISP 70% ISPRP (MISCELLANEOUS) ×2
BLADE SAW SAG 25X90X1.19 (BLADE) ×2 IMPLANT
BLADE SURG SZ20 CARB STEEL (BLADE) ×2 IMPLANT
BNDG CMPR STD VLCR NS LF 5.8X6 (GAUZE/BANDAGES/DRESSINGS) ×1
BNDG ELASTIC 6X5.8 VLCR NS LF (GAUZE/BANDAGES/DRESSINGS) ×2 IMPLANT
BRNG TIB 71X12 ANT STAB MDLR (Insert) ×1 IMPLANT
CANISTER SUCT 1200ML W/VALVE (MISCELLANEOUS) ×2 IMPLANT
CEMENT BONE R 1X40 (Cement) ×4 IMPLANT
CEMENT VACUUM MIXING SYSTEM (MISCELLANEOUS) ×2 IMPLANT
CHLORAPREP W/TINT 26 (MISCELLANEOUS) ×4 IMPLANT
COMPONENT PATELLAR VGD 7.8X3 (Joint) ×2 IMPLANT
COOLER POLAR GLACIER W/PUMP (MISCELLANEOUS) ×2 IMPLANT
COVER MAYO STAND REUSABLE (DRAPES) ×2 IMPLANT
COVER WAND RF STERILE (DRAPES) ×2 IMPLANT
CUFF TOURN SGL QUICK 24 (TOURNIQUET CUFF)
CUFF TOURN SGL QUICK 30 (TOURNIQUET CUFF)
CUFF TOURN SGL QUICK 34 (TOURNIQUET CUFF) ×2
CUFF TRNQT CYL 24X4X16.5-23 (TOURNIQUET CUFF) IMPLANT
CUFF TRNQT CYL 30X4X21-28X (TOURNIQUET CUFF) IMPLANT
CUFF TRNQT CYL 34X4.125X (TOURNIQUET CUFF) ×1 IMPLANT
DRAPE 3/4 80X56 (DRAPES) ×2 IMPLANT
DRAPE IMP U-DRAPE 54X76 (DRAPES) ×2 IMPLANT
DRSG MEPILEX SACRM 8.7X9.8 (GAUZE/BANDAGES/DRESSINGS) IMPLANT
DRSG OPSITE POSTOP 4X10 (GAUZE/BANDAGES/DRESSINGS) IMPLANT
DRSG OPSITE POSTOP 4X8 (GAUZE/BANDAGES/DRESSINGS) ×2 IMPLANT
ELECT REM PT RETURN 9FT ADLT (ELECTROSURGICAL) ×2
ELECTRODE REM PT RTRN 9FT ADLT (ELECTROSURGICAL) ×1 IMPLANT
FEMORAL CR LEFT 65MM (Joint) ×2 IMPLANT
GAUZE XEROFORM 1X8 LF (GAUZE/BANDAGES/DRESSINGS) ×2 IMPLANT
GLOVE SRG 8 PF TXTR STRL LF DI (GLOVE) ×1 IMPLANT
GLOVE SURG ENC MOIS LTX SZ7.5 (GLOVE) ×8 IMPLANT
GLOVE SURG ENC MOIS LTX SZ8 (GLOVE) ×8 IMPLANT
GLOVE SURG PR MICRO ENCORE 7.5 (GLOVE) ×2 IMPLANT
GLOVE SURG SYN 6.5 ES PF (GLOVE) ×2 IMPLANT
GLOVE SURG UNDER LTX SZ8 (GLOVE) ×2 IMPLANT
GLOVE SURG UNDER POLY LF SZ6.5 (GLOVE) ×2 IMPLANT
GLOVE SURG UNDER POLY LF SZ8 (GLOVE) ×2
GOWN STRL REUS W/ TWL LRG LVL3 (GOWN DISPOSABLE) ×1 IMPLANT
GOWN STRL REUS W/ TWL XL LVL3 (GOWN DISPOSABLE) ×1 IMPLANT
GOWN STRL REUS W/TWL LRG LVL3 (GOWN DISPOSABLE) ×2
GOWN STRL REUS W/TWL XL LVL3 (GOWN DISPOSABLE) ×2
HOOD PEEL AWAY FLYTE STAYCOOL (MISCELLANEOUS) ×8 IMPLANT
IMMOB KNEE 24 THIGH 24 443303 (SOFTGOODS) ×2 IMPLANT
INSERT TIB BEARING 71X12 (Insert) ×2 IMPLANT
IV NS IRRIG 3000ML ARTHROMATIC (IV SOLUTION) ×2 IMPLANT
KIT TURNOVER KIT A (KITS) ×2 IMPLANT
MANIFOLD NEPTUNE II (INSTRUMENTS) ×2 IMPLANT
NEEDLE SPNL 20GX3.5 QUINCKE YW (NEEDLE) ×2 IMPLANT
NS IRRIG 1000ML POUR BTL (IV SOLUTION) ×2 IMPLANT
PACK TOTAL KNEE (MISCELLANEOUS) ×2 IMPLANT
PAD WRAPON POLAR KNEE (MISCELLANEOUS) ×1 IMPLANT
PENCIL SMOKE EVACUATOR (MISCELLANEOUS) ×2 IMPLANT
PLATE KNEE TIBIAL 71MM FIXED (Plate) ×2 IMPLANT
PULSAVAC PLUS IRRIG FAN TIP (DISPOSABLE) ×2
STAPLER SKIN PROX 35W (STAPLE) ×2 IMPLANT
SUCTION FRAZIER HANDLE 10FR (MISCELLANEOUS) ×1
SUCTION TUBE FRAZIER 10FR DISP (MISCELLANEOUS) ×1 IMPLANT
SUT VIC AB 0 CT1 36 (SUTURE) ×6 IMPLANT
SUT VIC AB 2-0 CT1 27 (SUTURE) ×10
SUT VIC AB 2-0 CT1 TAPERPNT 27 (SUTURE) ×5 IMPLANT
SYR 10ML LL (SYRINGE) ×2 IMPLANT
SYR 20ML LL LF (SYRINGE) ×2 IMPLANT
SYR 30ML LL (SYRINGE) IMPLANT
TIP FAN IRRIG PULSAVAC PLUS (DISPOSABLE) ×1 IMPLANT
WRAPON POLAR PAD KNEE (MISCELLANEOUS) ×2

## 2020-05-16 NOTE — Discharge Instructions (Addendum)
Orthopedic discharge instructions: May shower with intact OpSite dressing. (Clear plastic dressing) Apply ice frequently to knee or use Polar Care. Start Eliquis 2.5 mg BID for 2 weeks starting tomorrow, then take baby aspirin (81 mg) 2 tablets twice daily for 4 weeks. Take oxycodone as prescribed when needed.  May supplement with ES Tylenol if necessary. Tylenol maximum is 4000mg  in 24 hours ( including the 325mg  in each percocet) May weight-bear as tolerated on left leg -use walker for balance and support. Follow-up in 10-14 days or as scheduled.  AMBULATORY SURGERY  DISCHARGE INSTRUCTIONS   1) The drugs that you were given will stay in your system until tomorrow so for the next 24 hours you should not:  A) Drive an automobile B) Make any legal decisions C) Drink any alcoholic beverage   2) You may resume regular meals tomorrow.  Today it is better to start with liquids and gradually work up to solid foods.  You may eat anything you prefer, but it is better to start with liquids, then soup and crackers, and gradually work up to solid foods.   3) Please notify your doctor immediately if you have any unusual bleeding, trouble breathing, redness and pain at the surgery site, drainage, fever, or pain not relieved by medication.    Additional Instructions:  DO NOT REMOVE EXPAREL BRACELET FOR 4 days  Please contact your physician with any problems or Same Day Surgery at [phone], Monday through Friday 6 am to 4 pm, or [place] at [facility] number at [phone].

## 2020-05-16 NOTE — Anesthesia Preprocedure Evaluation (Signed)
Anesthesia Evaluation  Patient identified by MRN, date of birth, ID band Patient awake    Reviewed: Allergy & Precautions, H&P , NPO status , Patient's Chart, lab work & pertinent test results, reviewed documented beta blocker date and time   History of Anesthesia Complications (+) PONV and history of anesthetic complications  Airway Mallampati: III   Neck ROM: full    Dental  (+) Poor Dentition, Teeth Intact   Pulmonary neg pulmonary ROS, former smoker,    Pulmonary exam normal        Cardiovascular Exercise Tolerance: Poor hypertension, On Medications + CAD, + Past MI and + Cardiac Stents  Normal cardiovascular exam Rhythm:regular Rate:Normal     Neuro/Psych  Neuromuscular disease negative psych ROS   GI/Hepatic Neg liver ROS, PUD, GERD  Medicated,  Endo/Other  negative endocrine ROSdiabetes, Well Controlled, Oral Hypoglycemic Agents  Renal/GU negative Renal ROS  negative genitourinary   Musculoskeletal   Abdominal   Peds  Hematology negative hematology ROS (+)   Anesthesia Other Findings Past Medical History: No date: CAD (coronary artery disease)     Comment:  a. NSTEMI 04/2014; b. cardiac cath 05/30/2014: ost LAD 30%,              mLAD 95% s/p PCI/DES, ost RCA 60%, pRCA 50%, dRCA 60%, EF              >55% by echo;  c. 12/2014 NSTEMI/PCI: LM nl, LAD 30ost,               patent mid stent, D1 small, D2 min dzs, D3 small, LCX min              irregs, OM1 small, OM2 min irregs, OM3 70, RCA 60ost,               40p, 37m/71m/d, 70d (3.25x28 Xience Alpine DES), RPDA min              irregs, EF 55-65%. No date: Diabetes mellitus without complication (HCC) No date: Diastolic dysfunction     Comment:  a. echo 04/2014: EF >50%, no WMA, diastolic dysfunction,               no valvular abnormalities, normal RVSP;  b. 12/2014 EF               55-65% by LV gram. No date: Gastric ulcer No date: GERD (gastroesophageal reflux  disease) No date: HLD (hyperlipidemia) No date: Hypertension may 2016, December 2016: Myocardial infarction [facility])     Comment:  stents placed each time No date: Obesity No date: PONV (postoperative nausea and vomiting)     Comment:  once No date: RA (rheumatoid arthritis) ([place]) 2019: Uterine cancer ([place])     Comment:  at [facility] Past Surgical History: No date: ABDOMINAL HYSTERECTOMY     Comment:  cancer 05/30/2014: CARDIAC CATHETERIZATION; N/A     Comment:  Procedure: Left Heart Cath and Coronary Angiography;                Surgeon: Wellington Hampshire, MD;  Location: [place]               CV LAB;  Service: Cardiovascular;  Laterality: N/A; 05/30/2014: CARDIAC CATHETERIZATION; N/A     Comment:  Procedure: Coronary Stent Intervention;  Surgeon:               Wellington Hampshire, MD;  Location: [place] CV LAB;  Service: Cardiovascular;  Laterality: N/A; 01/02/2015: CARDIAC CATHETERIZATION; N/A     Comment:  Procedure: Left Heart Cath and Coronary Angiography;                Surgeon: Wellington Hampshire, MD;  Location: [place]               CV LAB;  Service: Cardiovascular;  Laterality: N/A; 01/02/2015: CARDIAC CATHETERIZATION; N/A     Comment:  Procedure: Coronary Stent Intervention;  Surgeon:               Wellington Hampshire, MD;  Location: [place] CV LAB;                Service: Cardiovascular;  Laterality: N/A; BMI    Body Mass Index: 35.97 kg/m     Reproductive/Obstetrics negative OB ROS                             Anesthesia Physical Anesthesia Plan  ASA: III  Anesthesia Plan: Spinal   Post-op Pain Management:    Induction:   PONV Risk Score and Plan:   Airway Management Planned:   Additional Equipment:   Intra-op Plan:   Post-operative Plan:   Informed Consent: I have reviewed the patients History and Physical, chart, labs and discussed the procedure including the risks, benefits and alternatives for the proposed  anesthesia with the patient or authorized representative who has indicated his/her understanding and acceptance.     Dental Advisory Given  Plan Discussed with: CRNA  Anesthesia Plan Comments:         Anesthesia Quick Evaluation

## 2020-05-16 NOTE — TOC Progression Note (Addendum)
Transition of Care [facility]) - Progression Note    Patient Details  Name: Karen Cervantes MRN: [id number] Date of Birth: 1955-11-06  Transition of Care [facility]) CM/SW [place], RN Phone Number: 05/16/2020, 12:05 PM  Clinical Narrative:    Leone [employer] of need for 3 in 1 and rolling walker for discharge home today.   Spoke with [place] @ [facility] and confirmed patient is set up with [place].         Expected Discharge Plan and Services           Expected Discharge Date: 05/16/20                                     Social Determinants of Health (SDOH) Interventions    Readmission Risk Interventions No flowsheet data found.

## 2020-05-16 NOTE — H&P (Signed)
History of Present Illness:  Karen Cervantes is a 65 y.o. female with past medical history of type 2 diabetes, seropositive rheumatoid arthritis, spinal stenosis and coronary artery disease that presents to clinic today for her preoperative history and evaluation. Patient presents unaccompanied. The patient is scheduled to undergo a left total knee arthroplasty on 05/16/20 by Dr. Roland Rack. Her pain began several months ago. The pain is located along the medial aspect of the knee. She describes her pain as aching, stabbing, and throbbing. She denies associated numbness or tingling, denies mechanical symptoms.   The patient's symptoms have progressed to the point that they decrease her quality of life. The patient has previously undergone conservative treatment including NSAIDS and injections to the knee without adequate control of her symptoms.  She denies history of blood clots, history of lumbar surgery. She has previously had 2 stents placed but has been followed by cardiology recently. Her last A1c was 6.5 on 05/09/2020. The patient states her husband will be home with her to assist.   Past Medical History:  . Heart disease  . Rheumatoid arthritis (CMS-HCC)  . Ulcer   Past Surgical History:  No pertinent surgical history.  Current Medications:  . aspirin 81 MG EC tablet Take 81 mg by mouth once daily.  Marland Kitchen atorvastatin (LIPITOR) 40 MG tablet Take 40 mg by mouth once daily  . carvedilol (COREG) 12.5 MG tablet Take 12.5 mg by mouth once daily.  . cyclobenzaprine (FLEXERIL) 10 MG tablet Take 20 mg by mouth nightly  . folic acid (FOLVITE) 1 MG tablet Take 1 mg by mouth once daily  . gabapentin (NEURONTIN) 300 MG capsule 900 mg by mouth 2 (two) times daily  . liraglutide (VICTOZA) 0.6 mg/0.1 mL (18 mg/3 mL) pen injector Inject subcutaneously once daily  . lisinopriL (ZESTRIL) 40 MG tablet Take 20 mg by mouth once daily  . melatonin 10 mg Cap Take 1 tablet by mouth nightly as needed  . multivitamin  tablet Take 1 tablet by mouth once daily.  Marland Kitchen omeprazole (PRILOSEC) 20 MG DR capsule Take 20 mg by mouth once daily.  Marland Kitchen oxyCODONE-acetaminophen (PERCOCET) 10-325 mg tablet Take 1 tablet by mouth every 6 (six) hours as needed for Pain.  . traZODone (DESYREL) 50 MG tablet Take 100 mg by mouth nightly  . atorvastatin (LIPITOR) 80 MG tablet Take 80 mg by mouth once daily.  . ciprofloxacin HCl (CIPRO) 250 MG tablet Take 1 tablet (250 mg total) by mouth 2 (two) times daily for 5 days 10 tablet 0  . clopidogrel (PLAVIX) 75 mg tablet Take 75 mg by mouth once daily.  Marland Kitchen doxylamine succinate (UNISOM) 25 mg tablet Take 25 mg by mouth nightly as needed for Sleep.  Marland Kitchen FOLIC ACID ORAL Take 1 mg by mouth once daily.  Marland Kitchen lisinopril (PRINIVIL,ZESTRIL) 10 MG tablet Take 20 mg by mouth once daily.  Marland Kitchen MELATONIN/PYRIDOXINE HCL, B6, (MELATONEX ORAL) Take 10 mg by mouth nightly.  . methotrexate 2.5 mg/mL Soln Inject subcutaneously.  . riTUXimab (RITUXAN) 10 mg/mL injection Inject into the vein.   Allergies:  . Cosentyx [Secukinumab] - Itching  . Infliximab - Hives, Hypertension   Social History:   Socioeconomic History:  Marland Kitchen Marital status: Married  Tobacco Use  . Smoking status: Former Research scientist (life sciences)  . Smokeless tobacco: Never Used  Substance and Sexual Activity  . Alcohol use: No   Family History: History reviewed. No pertinent family history.  Review of Systems:  A 10+ ROS was performed, reviewed, and the  pertinent orthopaedic findings are documented in the HPI.   Physical Examination:  BP 130/84 (BP Location: Right upper arm, Patient Position: Sitting, BP Cuff Size: Large Adult)  Ht 160 cm (5\' 3" )  Wt 92.2 kg (203 lb 3.2 oz)  BMI 36.00 kg/m   Patient is a well-developed, well-nourished female in no acute distress. Patient has normal mood and affect. Patient is alert and oriented to person, place, and time.   HEENT: Atraumatic, normocephalic. Pupils equal and reactive to light. Extraocular motion intact.  Noninjected sclera.  Cardiovascular: Regular rate and rhythm, with no murmurs, rubs, or gallops. Distal pulses palpable. No bruits.  Respiratory: Lungs clear to auscultation bilaterally.   Left knee exam: GAIT: moderate limp and uses no assistive devices. ALIGNMENT: mild varus SKIN: unremarkable SWELLING: mild EFFUSION: 1+ WARMTH: no warmth TENDERNESS: moderate over the medial joint line ROM: 15 to 100 degrees with mild pain at the extremes of flexion and extension McMURRAY'S: equivocal PATELLOFEMORAL: normal tracking with no peri-patellar tenderness and negative apprehension sign CREPITUS: no LACHMAN'S: negative PIVOT SHIFT: negative ANTERIOR DRAWER: negative POSTERIOR DRAWER: negative VARUS/VALGUS: positive pseudolaxity to varus stressing  Sensation intact over the saphenous, lateral sural cutaneous, superficial fibular, and deep fibular nerve distributions.  Tests Performed/Reviewed:  Previous radiographs were reviewed of the left knee and revealed complete loss of medial compartment joint space with bone-on-bone contact, subchondral sclerosis, and significant osteophyte formation. No fractures noted.  Impression:  1. Primary osteoarthritis of left knee.  2. Morbid obesity due to excess calories.   Plan:  The treatment options, including both surgical and nonsurgical choices, have been discussed in detail with the patient. She would like to proceed with a surgical procedure, specifically a left total knee arthroplasty. The risks (including bleeding, infection, nerve and/or blood vessel injury, persistent or recurrent pain, loosening or failure of the components, need for further surgery, blood clots, strokes, heart attacks or arrhythmias, pneumonia, etc.) and benefits of the surgical procedure were discussed. The patient states her understanding and agrees to proceed. She agrees to a blood transfusion if necessary. A formal written consent will be obtained by the nursing  staff.   H&P reviewed and patient re-examined. No changes.

## 2020-05-16 NOTE — OR Nursing (Signed)
Dr. Roland Rack agreed for patient to take a regular strength aspirin daily for 6 weeks to prevent DVT instead of Eliquis.  Instructed on leg exercises and use of Ted hose at home for prevention as well

## 2020-05-16 NOTE — Transfer of Care (Signed)
Immediate Anesthesia Transfer of Care Note  Patient: EVELY GAINEY  Procedure(s) Performed: TOTAL KNEE ARTHROPLASTY (Left Knee)  Patient Location: PACU  Anesthesia Type:Spinal  Level of Consciousness: awake, alert  and oriented  Airway & Oxygen Therapy: Patient Spontanous Breathing and Patient connected to face mask oxygen  Post-op Assessment: Report given to RN and Post -op Vital signs reviewed and stable  Post vital signs: Reviewed and stable  Last Vitals:  Vitals Value Taken Time  BP 116/56 05/16/20 1008  Temp    Pulse 85 05/16/20 1009  Resp 13 05/16/20 1009  SpO2 100 % 05/16/20 1009  Vitals shown include unvalidated device data.  Last Pain:  Vitals:   05/16/20 0631  TempSrc: Temporal  PainSc: 0-No pain      Patients Stated Pain Goal: 0 (43/60/67 7034)  Complications: No complications documented.

## 2020-05-16 NOTE — Anesthesia Procedure Notes (Signed)
Spinal  Patient location during procedure: OR Start time: 05/16/2020 7:35 AM End time: 05/16/2020 7:40 AM Reason for block: surgical anesthesia Staffing Performed: other anesthesia staff  Preanesthetic Checklist Completed: patient identified, IV checked, site marked, risks and benefits discussed, surgical consent, monitors and equipment checked, pre-op evaluation and timeout performed Spinal Block Patient position: sitting Prep: Betadine Patient monitoring: heart rate, continuous pulse ox, blood pressure and cardiac monitor Approach: midline Location: L4-5 Injection technique: single-shot Needle Needle type: Introducer and Pencan  Needle gauge: 24 G Needle length: 9 cm Assessment Sensory level: T10 Events: CSF return Additional Notes Negative paresthesia. Negative blood return. Positive free-flowing CSF. Expiration date of kit checked and confirmed. Patient tolerated procedure well, without complications.

## 2020-05-16 NOTE — Evaluation (Signed)
Physical Therapy Evaluation Patient Details Name: Karen Cervantes MRN: [id number] DOB: 08/16/1955 Today's Date: 05/16/2020   History of Present Illness  Pt admitted for L TKA. History includes DM, RA, and CAD. Seen on POD 0 for same day surgery.  Clinical Impression  Pt is a pleasant 65 year old female who was admitted for L TKA. Pt performs bed mobility with min assist and transfers/ambulation with cga and RW. Educated on safe mobility. Encouraged short distances due to B UE fatigue on RW. KI used for stair training secondary to buckling. Encouraged extension of L knee with towel roll under ankle. Pt demonstrates deficits with strength/mobility/ROM. Pt motivated to return home. Family at bedside and observed all mobility. No further acute PT required at this time. Orders for HHPT to follow. RN aware. Will dc current orders.     Follow Up Recommendations Home health PT;Supervision/Assistance - 24 hour    Equipment Recommendations   (has equipment)    Recommendations for Other Services       Precautions / Restrictions Precautions Precautions: Fall;Knee Precaution Booklet Issued: Yes (comment) Restrictions Weight Bearing Restrictions: Yes LLE Weight Bearing: Weight bearing as tolerated      Mobility  Bed Mobility Overal bed mobility: Needs Assistance Bed Mobility: Supine to Sit     Supine to sit: Min assist     General bed mobility comments: safe technique with slight assist needed for trunk support. ONce seated, upright posture noted. Reports she can barely feel floor under feet.    Transfers Overall transfer level: Needs assistance Equipment used: Rolling walker (2 wheeled) Transfers: Sit to/from Stand Sit to Stand: Min guard         General transfer comment: cues for safe hand placement. Transfers performed from multiple surfaces with decreased assist from recliner vs bed. Unable to stand with upright posture  Ambulation/Gait Ambulation/Gait assistance: Min  guard Gait Distance (Feet): 100 Feet Assistive device: Rolling walker (2 wheeled) Gait Pattern/deviations: Step-to pattern     General Gait Details: antalgic gait with decreased toe clearance noted on L foot. RW used with heavy B UE support. Fatigues quickly  Stairs Stairs: Yes Stairs assistance: Min assist Stair Management: Two rails;Step to pattern Number of Stairs: 4 General stair comments: demonstrated prior to performance. Educated on need for assistance at home. KI applied for stair training with hip circumduction to clear step on L LE.  Wheelchair Mobility    Modified Rankin (Stroke Patients Only)       Balance Overall balance assessment: Needs assistance Sitting-balance support: Feet supported Sitting balance-Leahy Scale: Good     Standing balance support: Bilateral upper extremity supported Standing balance-Leahy Scale: Good                               Pertinent Vitals/Pain Pain Assessment: 0-10 Pain Score: 7  Pain Location: L TKA Pain Descriptors / Indicators: Operative site guarding Pain Intervention(s): Limited activity within patient's tolerance;Repositioned;Premedicated before session    Home Living Family/patient expects to be discharged to:: Private residence Living Arrangements: Spouse/significant other Available Help at Discharge: Family Type of Home: House Home Access: Stairs to enter Entrance Stairs-Rails: Can reach both Entrance Stairs-Number of Steps: 3 Home Layout: One level Home Equipment: Walker - 2 wheels;Bedside commode;Shower seat;Cane - single point (RW and BSC delivered to post op)      Prior Function Level of Independence: Independent (limping due to pain)                 Hand Dominance        Extremity/Trunk Assessment   Upper Extremity Assessment Upper Extremity Assessment: Generalized weakness (B UE grossly 3+/5)    Lower Extremity Assessment Lower Extremity Assessment: Generalized weakness (L LE  grossly 3/5; 2/5 DF; R LE grossly 5/5)       Communication   Communication: No difficulties  Cognition Arousal/Alertness: Awake/alert Behavior During Therapy: WFL for tasks assessed/performed Overall Cognitive Status: Within Functional Limits for tasks assessed                                        General Comments      Exercises Total Joint Exercises Goniometric ROM: L knee AAROM: 19-85 degrees Other Exercises Other Exercises: supine ther-ex performed on L LE including AP, QS, SLRs, hip abd/add, SAQ, LAQ, and knee flexion. Written HEP given and reviewed. 10 reps with cga Other Exercises: Education provided on polar care and adaptive equipment.   Assessment/Plan    PT Assessment All further PT needs can be met in the next venue of care  PT Problem List Decreased strength;Decreased balance;Decreased mobility;Decreased knowledge of use of DME;Pain;Decreased range of motion       PT Treatment Interventions      PT Goals (Current goals can be found in the Care Plan section)  Acute Rehab PT Goals Patient Stated Goal: to go home today PT Goal Formulation: With patient Time For Goal Achievement: 05/16/20 Potential to Achieve Goals: Good    Frequency     Barriers to discharge        Co-evaluation               AM-PAC PT "6 Clicks" Mobility  Outcome Measure Help needed turning from your back to your side while in a flat bed without using bedrails?: A Little Help needed moving from lying on your back to sitting on the side of a flat bed without using bedrails?: A Little Help needed moving to and from a bed to a chair (including a wheelchair)?: A Little Help needed standing up from a chair using your arms (e.g., wheelchair or bedside chair)?: A Little Help needed to walk in hospital room?: A Little Help needed climbing 3-5 steps with a railing? : A Little 6 Click Score: 18    End of Session Equipment Utilized During Treatment: Gait belt Activity  Tolerance: Patient limited by pain Patient left: in chair (with daughter present) Nurse Communication: Mobility status PT Visit Diagnosis: Muscle weakness (generalized) (M62.81);Difficulty in walking, not elsewhere classified (R26.2);Pain Pain - Right/Left: Left Pain - part of body: Knee    Time: 1315 ([phone])-1511 (1554) PT Time Calculation (min) (ACUTE ONLY): 116 min   Charges:   PT Evaluation $PT Eval Low Complexity: 1 Low PT Treatments $Gait Training: 38-52 mins $Therapeutic Exercise: 23-37 mins        Stephanie Ray, PT, DPT [phone]   Ray,Stephanie 05/16/2020, 4:51 PM   

## 2020-05-16 NOTE — Op Note (Signed)
05/16/2020  10:09 AM  Patient:   Karen Cervantes  Pre-Op Diagnosis:   Degenerative joint disease, left knee.  Post-Op Diagnosis:   Same  Procedure:   Left TKA using all-cemented [employer] Vanguard system with a 65 mm PCR femur, a 71 mm tibial tray with a 12 mm anterior stabilized E-poly insert, and a 34 x 7.8 mm all-poly 3-pegged domed patella.  Surgeon:   Pascal Lux, MD  Assistant:   Cameron Proud, PA-C; Marijean Bravo, PA-S  Anesthesia:   Spinal  Findings:   As above  Complications:   None  EBL:   10 cc  Fluids:   800 cc crystalloid  UOP:   None  TT:   100 minutes at 300 mmHg  Drains:   None  Closure:   Staples  Implants:   As above  Brief Clinical Note:   The patient is a 65 year old female with a long history of progressively worsening left knee pain. The patient's symptoms have progressed despite medications, activity modification, injections, etc. The patient's history and examination were consistent with advanced degenerative joint disease of the left knee confirmed by plain radiographs. The patient presents at this time for a left total knee arthroplasty.  Procedure:   The patient was brought into the operating room. After adequate spinal anesthesia was obtained, the patient was lain in the supine position before the left lower extremity was prepped with ChloraPrep solution and draped sterilely. Preoperative antibiotics were administered. After verifying the proper laterality with a surgical timeout, the limb was exsanguinated with an Esmarch and the tourniquet inflated to 300 mmHg.   A standard anterior approach to the knee was made through an approximately 7 inch incision. The incision was carried down through the subcutaneous tissues to expose superficial retinaculum. This was split the length of the incision and the medial flap elevated sufficiently to expose the medial retinaculum. The medial retinaculum was incised, leaving a 3-4 mm cuff of tissue on the patella.  This was extended distally along the medial border of the patellar tendon and proximally through the medial third of the quadriceps tendon. A subtotal fat pad excision was performed before the soft tissues were elevated off the anteromedial and anterolateral aspects of the proximal tibia to the level of the collateral ligaments. The anterior portions of the medial and lateral menisci were removed, as was the anterior cruciate ligament. With the knee flexed to 90, the external tibial guide was positioned and the appropriate proximal tibial cut made. This piece was taken to the back table where it was measured and found to be optimally replicated by a 71 mm component.  Attention was directed to the distal femur. The intramedullary canal was accessed through a 3/8" drill hole. The intramedullary guide was inserted and positioned in order to obtain a neutral flexion gap. The intercondylar block was positioned with care taken to avoid notching the anterior cortex of the femur. The appropriate cut was made. Next, the distal cutting block was placed at 5 of valgus alignment. Using the 9 mm slot, the distal cut was made. The distal femur was measured and found to be optimally replicated by the 65 mm component. The 65 mm 4-in-1 cutting block was positioned and first the posterior, then the posterior chamfer, the anterior chamfer, and finally the anterior cuts were made. At this point, the posterior portions medial and lateral menisci were removed. A trial reduction was performed using the appropriate femoral and tibial components with first the 10 mm and  then the 12 mm insert. The 12 mm insert demonstrated excellent stability to varus and valgus stressing both in flexion and extension while permitting full extension. Patella tracking was assessed and found to be excellent. Therefore, the tibial guide position was marked on the proximal tibia. The patella thickness was measured and found to be 20 mm. Therefore, the  appropriate cut was made. The patellar surface was measured and found to be optimally replicated by the 34 mm component. The three peg holes were drilled in place before the trial button was inserted. Patella tracking was assessed and found to be excellent, passing the "no thumb test". The lug holes were drilled into the distal femur before the trial component was removed, leaving only the tibial tray. The keel was then created using the appropriate tower, reamer, and punch.  The bony surfaces were prepared for cementing by irrigating them thoroughly with sterile saline solution via the jet lavage system. A bone plug was fashioned from some of the bone that had been removed previously and used to plug the distal femoral canal. In addition, 20 cc of Exparel diluted out to 60 cc with normal saline and 30 cc of 0.5% Sensorcaine were injected into the postero-medial and postero-lateral aspects of the knee, the medial and lateral gutter regions, and the peri-incisional tissues to help with postoperative analgesia. Meanwhile, the cement was being mixed on the back table. When it was ready, the tibial tray was cemented in first. The excess cement was removed using [employer] fast streamer. Next, the femoral component was impacted into place. Again, the excess cement was removed using [employer] fast streamer. The 12 mm trial insert was positioned and the knee brought into extension while the cement hardened. Finally, the patella was cemented into place and secured using the patellar clamp. Again, the excess cement was removed using [employer] fast streamer. Once the cement had hardened, the knee was placed through a range of motion with the findings as described above. Therefore, the trial insert was removed and, after verifying that no cement had been retained posteriorly, the permanent 12 mm anterior stabilized E-polyethylene insert was positioned and secured using the appropriate key locking mechanism. Again the knee was placed through a range  of motion with the findings as described above.  The wound was copiously irrigated with sterile saline solution using the jet lavage system before the quadriceps tendon and retinacular layer were reapproximated using #0 Vicryl interrupted sutures. The superficial retinacular layer also was closed using a running #0 Vicryl suture. A total of 10 cc of transexemic acid (TXA) was injected intra-articularly before the subcutaneous tissues were closed in several layers using 2-0 Vicryl interrupted sutures. The skin was closed using staples. A sterile honeycomb dressing was applied to the skin before the leg was wrapped with an Ace wrap to accommodate the Polar Care device. The patient was then awakened and returned to the recovery room in satisfactory condition after tolerating the procedure well.

## 2020-05-17 ENCOUNTER — Encounter: Payer: Self-pay | Admitting: Surgery

## 2020-05-18 NOTE — Anesthesia Postprocedure Evaluation (Signed)
Anesthesia Post Note  Patient: Karen Cervantes  Procedure(s) Performed: TOTAL KNEE ARTHROPLASTY (Left Knee)  Patient location during evaluation: PACU Anesthesia Type: Spinal Level of consciousness: awake and alert Pain management: pain level controlled Vital Signs Assessment: post-procedure vital signs reviewed and stable Respiratory status: spontaneous breathing, nonlabored ventilation, respiratory function stable and patient connected to nasal cannula oxygen Cardiovascular status: blood pressure returned to baseline and stable Postop Assessment: no apparent nausea or vomiting Anesthetic complications: no   No complications documented.   Last Vitals:  Vitals:   05/16/20 1251 05/16/20 1634  BP: 119/61 (!) 146/69  Pulse: 90 99  Resp: 16 16  Temp: 37.1 C 36.7 C  SpO2: 98% 95%    Last Pain:  Vitals:   05/17/20 0900  TempSrc:   PainSc: 0-No pain                 Molli Barrows

## 2020-07-03 ENCOUNTER — Other Ambulatory Visit: Payer: Self-pay | Admitting: Surgery

## 2020-07-10 ENCOUNTER — Encounter: Payer: Self-pay | Admitting: Urgent Care

## 2020-07-10 ENCOUNTER — Other Ambulatory Visit
Admission: RE | Admit: 2020-07-10 | Discharge: 2020-07-10 | Disposition: A | Discharge status: Home / Self Care | Payer: Medicare Other | Source: Ambulatory Visit | Attending: Surgery | Admitting: Surgery

## 2020-07-10 NOTE — Patient Instructions (Addendum)
Your procedure is scheduled on: Thursday July 13, 2020. Report to Day Surgery. To find out your arrival time please call [phone] between 1PM - 3PM on Wednesday July 13, 2019.  Remember: Instructions that are not followed completely may result in serious medical risk,  up to and including death, or upon the discretion of your surgeon and anesthesiologist your  surgery may need to be rescheduled.     _X__ 1. Do not eat food after midnight the night before your procedure.                 No chewing gum or hard candies. You may drink clear liquids up to 2 hours                 before you are scheduled to arrive for your surgery- DO not drink clear                 liquids within 2 hours of the start of your surgery.                 Clear Liquids include:  water, apple juice without pulp, clear Gatorade, G2 or                  Gatorade Zero (avoid Red/Purple/Blue), Black Coffee or Tea (Do not add                 anything to coffee or tea).  __X__2.  On the morning of surgery brush your teeth with toothpaste and water, you                may rinse your mouth with mouthwash if you wish.  Do not swallow any toothpaste of mouthwash.     _X__ 3.  No Alcohol for 24 hours before or after surgery.   _X__ 4.  Do Not Smoke or use e-cigarettes For 24 Hours Prior to Your Surgery.                 Do not use any chewable tobacco products for at least 6 hours prior to                 Surgery.  _X__  5.  Do not use any recreational drugs (marijuana, cocaine, heroin, ecstasy, MDMA or other)                For at least one week prior to your surgery.  Combination of these drugs with anesthesia                May have life threatening results.  __X__ 6.  Notify your doctor if there is any change in your medical condition      (cold, fever, infections).     Do not wear jewelry, make-up, hairpins, clips or nail polish. Do not wear lotions, powders, or perfumes. You may wear  deodorant. Do not shave 48 hours prior to surgery.  Do not bring valuables to the hospital.    [facility] is not responsible for any belongings or valuables.  Contacts, dentures or bridgework may not be worn into surgery. Leave your suitcase in the car. After surgery it may be brought to your room. For patients admitted to the hospital, discharge time is determined by your treatment team.   Patients discharged the day of surgery will not be allowed to drive home.   Make arrangements for someone to be with you for the first 24 hours of your Same  Day Discharge.   __X__ Take these medicines the morning of surgery with A SIP OF WATER:    1. carvedilol (COREG) 12.5 MG   2. ezetimibe (ZETIA) 10 MG tablet  3. gabapentin (NEURONTIN) 300 MG   4. omeprazole (PRILOSEC) 20 MG  5. oxyCODONE-acetaminophen (PERCOCET) 10-325 MG  6.  ____ Fleet Enema (as directed)   __X__ Use CHG Soap (or wipes) as directed  ____ Use Benzoyl Peroxide Gel as instructed  ____ Use inhalers on the day of surgery  ____ Stop metformin 2 days prior to surgery    ____ Take 1/2 of usual insulin dose the night before surgery. No insulin the morning          of surgery.   __X __ Follow instructions from your Doctor regarding your aspirin 81 mg and ask when to stop before your surgery.   __X__ One Week prior to surgery- Stop Anti-inflammatories such as Ibuprofen, Aleve, Advil, Motrin, meloxicam (MOBIC), diclofenac, etodolac, ketorolac, Toradol, Daypro, piroxicam, Goody's or BC powders. OK TO USE TYLENOL IF NEEDED   __X__ Stop supplements until after surgery.    ____ Bring C-Pap to the hospital.    If you have any questions regarding your pre-procedure instructions,  Please call Pre-admit Testing at [phone].

## 2020-07-13 ENCOUNTER — Ambulatory Visit: Admission: RE | Admit: 2020-07-13 | Payer: Medicare Other | Source: Home / Self Care | Admitting: Surgery

## 2020-07-13 ENCOUNTER — Encounter: Admission: RE | Payer: Self-pay | Source: Home / Self Care

## 2020-07-13 SURGERY — BURSECTOMY, ELBOW
Anesthesia: Choice | Site: Elbow | Laterality: Right

## 2020-10-11 ENCOUNTER — Other Ambulatory Visit: Payer: Self-pay | Admitting: Family Medicine

## 2020-10-11 DIAGNOSIS — Z13820 Encounter for screening for osteoporosis: Secondary | ICD-10-CM

## 2020-10-16 ENCOUNTER — Other Ambulatory Visit: Payer: Self-pay | Admitting: Surgery

## 2020-10-19 ENCOUNTER — Other Ambulatory Visit
Admission: RE | Admit: 2020-10-19 | Discharge: 2020-10-19 | Disposition: A | Discharge status: Home / Self Care | Payer: Medicare Other | Source: Ambulatory Visit | Attending: Surgery | Admitting: Surgery

## 2020-10-19 ENCOUNTER — Other Ambulatory Visit: Payer: Self-pay

## 2020-10-19 ENCOUNTER — Encounter: Payer: Self-pay | Admitting: Surgery

## 2020-10-19 ENCOUNTER — Other Ambulatory Visit: Payer: Medicare Other

## 2020-10-19 DIAGNOSIS — Z01818 Encounter for other preprocedural examination: Secondary | ICD-10-CM | POA: Insufficient documentation

## 2020-10-19 LAB — URINALYSIS, ROUTINE W REFLEX MICROSCOPIC
Bilirubin Urine: NEGATIVE
Glucose, UA: NEGATIVE mg/dL
Hgb urine dipstick: NEGATIVE
Ketones, ur: 5 mg/dL — AB
Leukocytes,Ua: NEGATIVE
Nitrite: NEGATIVE
Protein, ur: NEGATIVE mg/dL
Specific Gravity, Urine: 1.02 (ref 1.005–1.030)
pH: 6 (ref 5.0–8.0)

## 2020-10-19 LAB — COMPREHENSIVE METABOLIC PANEL
ALT: 20 U/L (ref 0–44)
AST: 24 U/L (ref 15–41)
Albumin: 4 g/dL (ref 3.5–5.0)
Alkaline Phosphatase: 65 U/L (ref 38–126)
Anion gap: 8 (ref 5–15)
BUN: 9 mg/dL (ref 8–23)
CO2: 27 mmol/L (ref 22–32)
Calcium: 8.9 mg/dL (ref 8.9–10.3)
Chloride: 102 mmol/L (ref 98–111)
Creatinine, Ser: 0.72 mg/dL (ref 0.44–1.00)
GFR, Estimated: >60 mL/min (ref >60)
Glucose, Bld: 132 mg/dL — ABNORMAL HIGH (ref 70–99)
Potassium: 4.7 mmol/L (ref 3.5–5.1)
Sodium: 137 mmol/L (ref 135–145)
Total Bilirubin: 0.6 mg/dL (ref 0.3–1.2)
Total Protein: 7 g/dL (ref 6.5–8.1)

## 2020-10-19 LAB — SURGICAL PCR SCREEN
MRSA, PCR: NEGATIVE
Staphylococcus aureus: NEGATIVE

## 2020-10-19 LAB — CBC WITH DIFFERENTIAL/PLATELET
Abs Immature Granulocytes: 0.04 10*3/uL (ref 0.00–0.07)
Basophils Absolute: 0.1 10*3/uL (ref 0.0–0.1)
Basophils Relative: 1 %
Eosinophils Absolute: 0.4 10*3/uL (ref 0.0–0.5)
Eosinophils Relative: 5 %
HCT: 33.1 % — ABNORMAL LOW (ref 36.0–46.0)
Hemoglobin: 11.1 g/dL — ABNORMAL LOW (ref 12.0–15.0)
Immature Granulocytes: 0 %
Lymphocytes Relative: 43 %
Lymphs Abs: 3.9 10*3/uL (ref 0.7–4.0)
MCH: 31.8 pg (ref 26.0–34.0)
MCHC: 33.5 g/dL (ref 30.0–36.0)
MCV: 94.8 fL (ref 80.0–100.0)
Monocytes Absolute: 0.8 10*3/uL (ref 0.1–1.0)
Monocytes Relative: 8 %
Neutro Abs: 3.9 10*3/uL (ref 1.7–7.7)
Neutrophils Relative %: 43 %
Platelets: 321 10*3/uL (ref 150–400)
RBC: 3.49 MIL/uL — ABNORMAL LOW (ref 3.87–5.11)
RDW: 13.2 % (ref 11.5–15.5)
WBC: 9.2 10*3/uL (ref 4.0–10.5)
nRBC: 0 % (ref 0.0–0.2)

## 2020-10-19 LAB — TYPE AND SCREEN
ABO/RH(D): O POS
Antibody Screen: NEGATIVE

## 2020-10-19 NOTE — Patient Instructions (Addendum)
Your procedure is scheduled on:  Thursday 10/26/20 Report to the Registration Desk on the 1st floor of the [facility]. To find out your arrival time, please call [phone] between 1PM - 3PM on: Wednesday 10/25/20  REMEMBER: Instructions that are not followed completely may result in serious medical risk, up to and including death; or upon the discretion of your surgeon and anesthesiologist your surgery may need to be rescheduled.  Do not eat food after midnight the night before surgery.  No gum chewing, lozengers or hard candies.  You may however, drink water up to 2 hours before you are scheduled to arrive for your surgery. Do not drink anything within 2 hours of your scheduled arrival time.  Type 1 and Type 2 diabetics should only drink water.  TAKE THESE MEDICATIONS THE MORNING OF SURGERY WITH A SIP OF WATER: Carvedilol Zetia Gabapentin Omeprazole Oxycodone-acetaminophen (make take if needed)   Omeprazole (take one the night before and one on the morning of surgery - helps to prevent nausea after surgery.)  **Follow new guidelines for insulin and diabetes medications.** Take full dose of Victoza night before Surgery  Please contact Dr. Nicholaus Bloom office today to ask when you will need to stop your Aspirin prior to surgery.  One week prior to surgery: Stop Anti-inflammatories (NSAIDS) such as Advil, Aleve, Ibuprofen, Motrin, Naproxen, Naprosyn and Aspirin based products such as Excedrin, Goodys Powder, BC Powder. Stop ANY OVER THE COUNTER supplements until after surgery Multivitamin, Melatonin, and Folic Acid. You may however, continue to take Tylenol if needed for pain up until the day of surgery.  No Alcohol for 24 hours before or after surgery.  No Smoking including e-cigarettes for 24 hours prior to surgery.  No chewable tobacco products for at least 6 hours prior to surgery.  No nicotine patches on the day of surgery.  Do not use any "recreational" drugs for at least a  week prior to your surgery.  Please be advised that the combination of cocaine and anesthesia may have negative outcomes, up to and including death. If you test positive for cocaine, your surgery will be cancelled.  On the morning of surgery brush your teeth with toothpaste and water, you may rinse your mouth with mouthwash if you wish. Do not swallow any toothpaste or mouthwash.  Use CHG Soap or wipes as directed on instruction sheet.  Do not wear jewelry, make-up, hairpins, clips or nail polish.  Do not wear lotions, powders, or perfumes.   Do not shave body from the neck down 48 hours prior to surgery just in case you cut yourself which could leave a site for infection.  Also, freshly shaved skin may become irritated if using the CHG soap.  Contact lenses, hearing aids and dentures may not be worn into surgery.  Do not bring valuables to the hospital. [facility] is not responsible for any missing/lost belongings or valuables.   Notify your doctor if there is any change in your medical condition (cold, fever, infection).  Wear comfortable clothing (specific to your surgery type) to the hospital.  After surgery, you can help prevent lung complications by doing breathing exercises.  Take deep breaths and cough every 1-2 hours. Your doctor may order a device called an Incentive Spirometer to help you take deep breaths.  If you are being admitted to the hospital overnight, leave your suitcase in the car. After surgery it may be brought to your room.  Please call the [facility]. at [phone]  if you have any questions about these instructions.  Surgery Visitation Policy:  Patients undergoing a surgery or procedure may have one family member or support person with them as long as that person is not COVID-19 positive or experiencing its symptoms.  That person may remain in the waiting area during the procedure and may rotate out with other people.  Inpatient  Visitation:    Visiting hours are 7 a.m. to 8 p.m. Up to two visitors ages 16+ are allowed at one time in a patient room. The visitors may rotate out with other people during the day. Visitors must check out when they leave, or other visitors will not be allowed. One designated support person may remain overnight. The visitor must pass COVID-19 screenings, use hand sanitizer when entering and exiting the patient's room and wear a mask at all times, including in the patient's room. Patients must also wear a mask when staff or their visitor are in the room. Masking is required regardless of vaccination status.

## 2020-10-23 ENCOUNTER — Other Ambulatory Visit: Payer: Self-pay

## 2020-10-23 ENCOUNTER — Other Ambulatory Visit
Admission: RE | Admit: 2020-10-23 | Discharge: 2020-10-23 | Disposition: A | Discharge status: Home / Self Care | Payer: Medicare Other | Source: Ambulatory Visit | Attending: Surgery | Admitting: Surgery

## 2020-10-23 DIAGNOSIS — Z20822 Contact with and (suspected) exposure to covid-19: Secondary | ICD-10-CM | POA: Insufficient documentation

## 2020-10-23 DIAGNOSIS — Z01812 Encounter for preprocedural laboratory examination: Secondary | ICD-10-CM | POA: Insufficient documentation

## 2020-10-23 LAB — SARS CORONAVIRUS 2 (TAT 6-24 HRS): SARS Coronavirus 2: NEGATIVE

## 2020-10-24 ENCOUNTER — Other Ambulatory Visit: Payer: Medicare Other

## 2020-10-25 MED ORDER — ORAL CARE MOUTH RINSE: 15.0000 mL | Freq: Once | OROMUCOSAL | Status: AC

## 2020-10-25 MED ORDER — CEFAZOLIN SODIUM-DEXTROSE 2-4 GM/100ML-% IV SOLN
2.0000 g | INTRAVENOUS | Status: AC
  Administered 2020-10-26: 2 g via INTRAVENOUS

## 2020-10-25 MED ORDER — SODIUM CHLORIDE 0.9 % IV SOLN: INTRAVENOUS | Status: DC

## 2020-10-25 MED ORDER — CHLORHEXIDINE GLUCONATE 0.12 % MT SOLN: 15.0000 mL | Freq: Once | OROMUCOSAL | Status: AC

## 2020-10-25 MED ORDER — APREPITANT 40 MG PO CAPS: 40.0000 mg | ORAL_CAPSULE | Freq: Once | ORAL | Status: AC

## 2020-10-26 ENCOUNTER — Ambulatory Visit: Payer: Medicare Other

## 2020-10-26 ENCOUNTER — Ambulatory Visit: Payer: Medicare Other | Admitting: Urgent Care

## 2020-10-26 ENCOUNTER — Ambulatory Visit
Admission: RE | Admit: 2020-10-26 | Discharge: 2020-10-26 | Disposition: A | Discharge status: Home / Self Care | Payer: Medicare Other | Attending: Surgery | Admitting: Surgery

## 2020-10-26 ENCOUNTER — Other Ambulatory Visit: Payer: Self-pay

## 2020-10-26 ENCOUNTER — Encounter: Payer: Self-pay | Admitting: Surgery

## 2020-10-26 ENCOUNTER — Encounter
Admission: RE | Disposition: A | Discharge status: Home / Self Care | Payer: Self-pay | Source: Home / Self Care | Attending: Surgery

## 2020-10-26 DIAGNOSIS — Z96651 Presence of right artificial knee joint: Secondary | ICD-10-CM

## 2020-10-26 DIAGNOSIS — Z79899 Other long term (current) drug therapy: Secondary | ICD-10-CM | POA: Insufficient documentation

## 2020-10-26 DIAGNOSIS — Z7982 Long term (current) use of aspirin: Secondary | ICD-10-CM | POA: Insufficient documentation

## 2020-10-26 DIAGNOSIS — M1711 Unilateral primary osteoarthritis, right knee: Secondary | ICD-10-CM | POA: Insufficient documentation

## 2020-10-26 DIAGNOSIS — Z96652 Presence of left artificial knee joint: Secondary | ICD-10-CM | POA: Diagnosis not present

## 2020-10-26 HISTORY — PX: TOTAL KNEE ARTHROPLASTY: SHX125

## 2020-10-26 HISTORY — DX: Hidradenitis suppurativa: L73.2

## 2020-10-26 HISTORY — DX: Spondylolysis, cervical region: M43.02

## 2020-10-26 HISTORY — DX: Spondylolysis, lumbar region: M43.06

## 2020-10-26 HISTORY — DX: Chronic ischemic heart disease, unspecified: I25.9

## 2020-10-26 HISTORY — DX: Acute coronary thrombosis not resulting in myocardial infarction: I24.0

## 2020-10-26 HISTORY — DX: Radiculopathy, cervical region: M54.12

## 2020-10-26 HISTORY — DX: Type 2 diabetes mellitus without complications: E11.9

## 2020-10-26 LAB — GLUCOSE, CAPILLARY
Glucose-Capillary: 132 mg/dL — ABNORMAL HIGH (ref 70–99)
Glucose-Capillary: 120 mg/dL — ABNORMAL HIGH (ref 70–99)

## 2020-10-26 SURGERY — ARTHROPLASTY, KNEE, TOTAL
Anesthesia: Spinal | Site: Knee | Laterality: Right

## 2020-10-26 MED ORDER — EPHEDRINE SULFATE 50 MG/ML IJ SOLN
INTRAMUSCULAR | Status: DC | PRN
  Administered 2020-10-26: 5 mg via INTRAVENOUS

## 2020-10-26 MED ORDER — ONDANSETRON HCL 4 MG/2ML IJ SOLN
INTRAMUSCULAR | Status: DC | PRN
  Administered 2020-10-26: 4 mg via INTRAVENOUS

## 2020-10-26 MED ORDER — FENTANYL CITRATE (PF) 100 MCG/2ML IJ SOLN
INTRAMUSCULAR | Status: DC | PRN
  Administered 2020-10-26 (×2): 50 ug via INTRAVENOUS

## 2020-10-26 MED ORDER — FENTANYL CITRATE (PF) 100 MCG/2ML IJ SOLN
INTRAMUSCULAR | Status: AC
  Filled 2020-10-26: qty 2

## 2020-10-26 MED ORDER — MIDAZOLAM HCL 5 MG/5ML IJ SOLN
INTRAMUSCULAR | Status: DC | PRN
  Administered 2020-10-26: 2 mg via INTRAVENOUS

## 2020-10-26 MED ORDER — PROPOFOL 1000 MG/100ML IV EMUL
INTRAVENOUS | Status: AC
  Filled 2020-10-26: qty 100

## 2020-10-26 MED ORDER — TRANEXAMIC ACID 1000 MG/10ML IV SOLN
INTRAVENOUS | Status: DC | PRN
  Administered 2020-10-26: 1000 mg via TOPICAL

## 2020-10-26 MED ORDER — CEFAZOLIN SODIUM-DEXTROSE 2-4 GM/100ML-% IV SOLN
2.0000 g | Freq: Four times a day (QID) | INTRAVENOUS | Status: DC
  Administered 2020-10-26: 2 g via INTRAVENOUS

## 2020-10-26 MED ORDER — FENTANYL CITRATE (PF) 100 MCG/2ML IJ SOLN: 25.0000 ug | INTRAMUSCULAR | Status: DC | PRN

## 2020-10-26 MED ORDER — BUPIVACAINE HCL (PF) 0.5 % IJ SOLN
INTRAMUSCULAR | Status: DC | PRN
  Administered 2020-10-26: 3 mL

## 2020-10-26 MED ORDER — CEFAZOLIN SODIUM-DEXTROSE 2-4 GM/100ML-% IV SOLN
INTRAVENOUS | Status: AC
  Filled 2020-10-26: qty 100

## 2020-10-26 MED ORDER — ONDANSETRON HCL 4 MG/2ML IJ SOLN: 4.0000 mg | Freq: Four times a day (QID) | INTRAMUSCULAR | Status: DC | PRN

## 2020-10-26 MED ORDER — SODIUM CHLORIDE 0.9 % IR SOLN
Status: DC | PRN
  Administered 2020-10-26: 3000 mL

## 2020-10-26 MED ORDER — MIDAZOLAM HCL 2 MG/2ML IJ SOLN
INTRAMUSCULAR | Status: AC
  Filled 2020-10-26: qty 2

## 2020-10-26 MED ORDER — 0.9 % SODIUM CHLORIDE (POUR BTL) OPTIME
TOPICAL | Status: DC | PRN
  Administered 2020-10-26: 100 mL

## 2020-10-26 MED ORDER — SODIUM CHLORIDE 0.9 % BOLUS PEDS: 250.0000 mL | Freq: Once | INTRAVENOUS | Status: DC

## 2020-10-26 MED ORDER — TRANEXAMIC ACID 1000 MG/10ML IV SOLN
INTRAVENOUS | Status: AC
  Filled 2020-10-26: qty 10

## 2020-10-26 MED ORDER — GLYCOPYRROLATE 0.2 MG/ML IJ SOLN
INTRAMUSCULAR | Status: DC | PRN
  Administered 2020-10-26: .2 mg via INTRAVENOUS

## 2020-10-26 MED ORDER — KETOROLAC TROMETHAMINE 15 MG/ML IJ SOLN: 7.5000 mg | Freq: Four times a day (QID) | INTRAMUSCULAR | Status: DC

## 2020-10-26 MED ORDER — BUPIVACAINE LIPOSOME 1.3 % IJ SUSP
INTRAMUSCULAR | Status: AC
  Filled 2020-10-26: qty 20

## 2020-10-26 MED ORDER — OXYCODONE HCL 5 MG PO TABS: 5.0000 mg | ORAL_TABLET | ORAL | 0 refills | Status: AC | PRN

## 2020-10-26 MED ORDER — METOCLOPRAMIDE HCL 10 MG PO TABS: 5.0000 mg | ORAL_TABLET | Freq: Three times a day (TID) | ORAL | Status: DC | PRN

## 2020-10-26 MED ORDER — VASOPRESSIN 20 UNIT/ML IV SOLN
INTRAVENOUS | Status: DC | PRN
  Administered 2020-10-26: 2 [IU] via INTRAVENOUS
  Administered 2020-10-26: 1 [IU] via INTRAVENOUS

## 2020-10-26 MED ORDER — DEXAMETHASONE SODIUM PHOSPHATE 10 MG/ML IJ SOLN
INTRAMUSCULAR | Status: DC | PRN
  Administered 2020-10-26: 10 mg via INTRAVENOUS

## 2020-10-26 MED ORDER — SODIUM CHLORIDE 0.9 % IV SOLN
INTRAVENOUS | Status: DC
  Administered 2020-10-26: 250 mL via INTRAVENOUS

## 2020-10-26 MED ORDER — APREPITANT 40 MG PO CAPS
ORAL_CAPSULE | ORAL | Status: AC
  Administered 2020-10-26: 40 mg via ORAL
  Filled 2020-10-26: qty 1

## 2020-10-26 MED ORDER — CHLORHEXIDINE GLUCONATE 0.12 % MT SOLN
OROMUCOSAL | Status: AC
  Administered 2020-10-26: 15 mL via OROMUCOSAL
  Filled 2020-10-26: qty 15

## 2020-10-26 MED ORDER — KETOROLAC TROMETHAMINE 15 MG/ML IJ SOLN
INTRAMUSCULAR | Status: AC
  Filled 2020-10-26: qty 1

## 2020-10-26 MED ORDER — CEFAZOLIN SODIUM-DEXTROSE 2-4 GM/100ML-% IV SOLN: 2.0000 g | INTRAVENOUS | Status: DC

## 2020-10-26 MED ORDER — SODIUM CHLORIDE 0.9 % IV SOLN
INTRAVENOUS | Status: DC | PRN
  Administered 2020-10-26: 60 mL

## 2020-10-26 MED ORDER — KETOROLAC TROMETHAMINE 15 MG/ML IJ SOLN
15.0000 mg | Freq: Once | INTRAMUSCULAR | Status: AC
  Administered 2020-10-26: 15 mg via INTRAVENOUS

## 2020-10-26 MED ORDER — SODIUM CHLORIDE 0.9 % IV SOLN
INTRAVENOUS | Status: DC | PRN
  Administered 2020-10-26: 25 ug/min via INTRAVENOUS

## 2020-10-26 MED ORDER — BUPIVACAINE-EPINEPHRINE (PF) 0.5% -1:200000 IJ SOLN
INTRAMUSCULAR | Status: AC
  Filled 2020-10-26: qty 30

## 2020-10-26 MED ORDER — ACETAMINOPHEN 10 MG/ML IV SOLN
INTRAVENOUS | Status: DC | PRN
  Administered 2020-10-26: 1000 mg via INTRAVENOUS

## 2020-10-26 MED ORDER — METOCLOPRAMIDE HCL 5 MG/ML IJ SOLN: 5.0000 mg | Freq: Three times a day (TID) | INTRAMUSCULAR | Status: DC | PRN

## 2020-10-26 MED ORDER — SODIUM CHLORIDE 0.9 % BOLUS PEDS
250.0000 mL | Freq: Once | INTRAVENOUS | Status: AC
  Administered 2020-10-26: 250 mL via INTRAVENOUS

## 2020-10-26 MED ORDER — BUPIVACAINE-EPINEPHRINE (PF) 0.5% -1:200000 IJ SOLN
INTRAMUSCULAR | Status: DC | PRN
  Administered 2020-10-26: 30 mL

## 2020-10-26 MED ORDER — OXYCODONE HCL 5 MG PO TABS: 5.0000 mg | ORAL_TABLET | ORAL | Status: DC | PRN

## 2020-10-26 MED ORDER — PHENYLEPHRINE HCL (PRESSORS) 10 MG/ML IV SOLN
INTRAVENOUS | Status: AC
  Filled 2020-10-26: qty 1

## 2020-10-26 MED ORDER — ONDANSETRON HCL 4 MG/2ML IJ SOLN: 4.0000 mg | Freq: Once | INTRAMUSCULAR | Status: DC | PRN

## 2020-10-26 MED ORDER — PROPOFOL 500 MG/50ML IV EMUL
INTRAVENOUS | Status: DC | PRN
  Administered 2020-10-26: 75 ug/kg/min via INTRAVENOUS

## 2020-10-26 MED ORDER — SODIUM CHLORIDE FLUSH 0.9 % IV SOLN
INTRAVENOUS | Status: AC
  Filled 2020-10-26: qty 40

## 2020-10-26 MED ORDER — ONDANSETRON HCL 4 MG PO TABS: 4.0000 mg | ORAL_TABLET | Freq: Four times a day (QID) | ORAL | Status: DC | PRN

## 2020-10-26 MED ORDER — ACETAMINOPHEN 500 MG PO TABS
1000.0000 mg | ORAL_TABLET | Freq: Four times a day (QID) | ORAL | Status: DC
Start: 2020-10-26 — End: 2020-10-26

## 2020-10-26 SURGICAL SUPPLY — 65 items
APL PRP STRL LF DISP 70% ISPRP (MISCELLANEOUS) ×1
BIT DRILL QUICK REL 1/8 2PK SL (DRILL) ×1 IMPLANT
BLADE SAW SAG 25X90X1.19 (BLADE) ×2 IMPLANT
BLADE SURG SZ20 CARB STEEL (BLADE) ×2 IMPLANT
BNDG CMPR STD VLCR NS LF 5.8X6 (GAUZE/BANDAGES/DRESSINGS) ×1
BNDG ELASTIC 6X5.8 VLCR NS LF (GAUZE/BANDAGES/DRESSINGS) ×2 IMPLANT
BRNG TIB 71X10 ANT STAB MDLR (Insert) ×1 IMPLANT
CEMENT BONE R 1X40 (Cement) ×4 IMPLANT
CEMENT VACUUM MIXING SYSTEM (MISCELLANEOUS) ×2 IMPLANT
CHLORAPREP W/TINT 26 (MISCELLANEOUS) ×2 IMPLANT
COMPONENT PATELLAR VGD 7.8X3 (Joint) ×2 IMPLANT
COOLER POLAR GLACIER W/PUMP (MISCELLANEOUS) ×2 IMPLANT
COVER MAYO STAND REUSABLE (DRAPES) ×2 IMPLANT
CUFF TOURN SGL QUICK 24 (TOURNIQUET CUFF)
CUFF TOURN SGL QUICK 34 (TOURNIQUET CUFF)
CUFF TRNQT CYL 24X4X16.5-23 (TOURNIQUET CUFF) IMPLANT
CUFF TRNQT CYL 34X4.125X (TOURNIQUET CUFF) IMPLANT
DRAPE 3/4 80X56 (DRAPES) ×2 IMPLANT
DRAPE IMP U-DRAPE 54X76 (DRAPES) ×2 IMPLANT
DRAPE INCISE 23X17 IOBAN STRL (DRAPES) ×1
DRAPE INCISE IOBAN 23X17 STRL (DRAPES) ×1 IMPLANT
DRAPE INCISE IOBAN 66X45 STRL (DRAPES) ×2 IMPLANT
DRILL QUICK RELEASE 1/8 INCH (DRILL) ×1
DRSG MEPILEX SACRM 8.7X9.8 (GAUZE/BANDAGES/DRESSINGS) ×2 IMPLANT
DRSG OPSITE POSTOP 4X10 (GAUZE/BANDAGES/DRESSINGS) ×2 IMPLANT
DRSG OPSITE POSTOP 4X8 (GAUZE/BANDAGES/DRESSINGS) IMPLANT
ELECT REM PT RETURN 9FT ADLT (ELECTROSURGICAL) ×2
ELECTRODE REM PT RTRN 9FT ADLT (ELECTROSURGICAL) ×1 IMPLANT
GAUZE 4X4 16PLY ~~LOC~~+RFID DBL (SPONGE) ×2 IMPLANT
GAUZE XEROFORM 1X8 LF (GAUZE/BANDAGES/DRESSINGS) ×2 IMPLANT
GLOVE SRG 8 PF TXTR STRL LF DI (GLOVE) ×1 IMPLANT
GLOVE SURG ENC MOIS LTX SZ7.5 (GLOVE) ×8 IMPLANT
GLOVE SURG ENC MOIS LTX SZ8 (GLOVE) ×8 IMPLANT
GLOVE SURG UNDER LTX SZ8 (GLOVE) ×2 IMPLANT
GLOVE SURG UNDER POLY LF SZ8 (GLOVE) ×2
GOWN STRL REUS W/ TWL LRG LVL3 (GOWN DISPOSABLE) ×3 IMPLANT
GOWN STRL REUS W/ TWL XL LVL3 (GOWN DISPOSABLE) ×1 IMPLANT
GOWN STRL REUS W/TWL LRG LVL3 (GOWN DISPOSABLE) ×6
GOWN STRL REUS W/TWL XL LVL3 (GOWN DISPOSABLE) ×2
HOOD PEEL AWAY FLYTE STAYCOOL (MISCELLANEOUS) ×2 IMPLANT
INSERT TIB BEARING 71 10 (Insert) ×2 IMPLANT
IV NS IRRIG 3000ML ARTHROMATIC (IV SOLUTION) ×2 IMPLANT
KIT TURNOVER KIT A (KITS) ×2 IMPLANT
KNEE CR FEMORAL RT 62.5MM (Femur) ×2 IMPLANT
MANIFOLD NEPTUNE II (INSTRUMENTS) ×2 IMPLANT
NEEDLE SPNL 20GX3.5 QUINCKE YW (NEEDLE) ×2 IMPLANT
NS IRRIG 1000ML POUR BTL (IV SOLUTION) ×2 IMPLANT
PACK TOTAL KNEE (MISCELLANEOUS) ×2 IMPLANT
PAD WRAPON POLAR KNEE (MISCELLANEOUS) ×1 IMPLANT
PLATE KNEE TIBIAL 71MM FIXED (Plate) ×2 IMPLANT
PULSAVAC PLUS IRRIG FAN TIP (DISPOSABLE) ×2
SPONGE T-LAP 18X18 ~~LOC~~+RFID (SPONGE) ×6 IMPLANT
STAPLER SKIN PROX 35W (STAPLE) ×2 IMPLANT
SUCTION FRAZIER HANDLE 10FR (MISCELLANEOUS) ×1
SUCTION TUBE FRAZIER 10FR DISP (MISCELLANEOUS) ×1 IMPLANT
SUT VIC AB 0 CT1 36 (SUTURE) ×6 IMPLANT
SUT VIC AB 2-0 CT1 27 (SUTURE) ×6
SUT VIC AB 2-0 CT1 TAPERPNT 27 (SUTURE) ×3 IMPLANT
SYR 10ML LL (SYRINGE) ×2 IMPLANT
SYR 20ML LL LF (SYRINGE) ×2 IMPLANT
SYR 30ML LL (SYRINGE) IMPLANT
TIP FAN IRRIG PULSAVAC PLUS (DISPOSABLE) ×1 IMPLANT
TRAP FLUID SMOKE EVACUATOR (MISCELLANEOUS) ×2 IMPLANT
WATER STERILE IRR 500ML POUR (IV SOLUTION) ×2 IMPLANT
WRAPON POLAR PAD KNEE (MISCELLANEOUS) ×2

## 2020-10-26 NOTE — H&P (Signed)
History of Present Illness: Karen Cervantes is a 65 y.o. female who presents today for her history and physical for upcoming right total knee arthroplasty. Surgery scheduled Dr. Roland Rack on 10/26/2020. The patient is status post a left total knee arthroplasty performed by Dr. Roland Rack in the past with excellent results. The patient denies any recent trauma or falls affecting the right knee. She denies any changes in her medical history since she was last evaluated. She denies any numbness or ting into the right lower extremity. The patient does have a history of rheumatoid arthritis but denies any history of heart attack, stroke, asthma or COPD. No personal history of blood clots. The patient reports a pain score of 8 out of 10 at today's visit. She does report an aching, throbbing discomfort in the right knee and occasional catching or locking symptoms in the right knee as well.  Past Medical History:  Heart disease   Rheumatoid arthritis (CMS-HCC)   Ulcer   Past Surgical History:  Left TKA 05/16/2020 (Dr. Roland Rack)   Past Family History: History reviewed. No pertinent family history.  Medications:  aspirin 81 MG EC tablet Take 81 mg by mouth once daily.   atorvastatin (LIPITOR) 40 MG tablet Take 40 mg by mouth once daily   carvedilol (COREG) 12.5 MG tablet Take 12.5 mg by mouth once daily.   cyclobenzaprine (FLEXERIL) 10 MG tablet Take 20 mg by mouth nightly   folic acid (FOLVITE) 1 MG tablet Take 1 mg by mouth once daily   gabapentin (NEURONTIN) 300 MG capsule Take 900 mg by mouth 2 (two) times daily   liraglutide (VICTOZA) 0.6 mg/0.1 mL (18 mg/3 mL) pen injector Inject subcutaneously once daily   lisinopril (PRINIVIL,ZESTRIL) 10 MG tablet Take 20 mg by mouth once daily.   melatonin 10 mg Cap Take 1 tablet by mouth nightly as needed   multivitamin tablet Take 1 tablet by mouth once daily.   omeprazole (PRILOSEC) 20 MG DR capsule Take 20 mg by mouth once daily.   ondansetron (ZOFRAN-ODT) 4 MG  disintegrating tablet Take by mouth Take 4 mg by mouth daily as needed for nausea/vomiting.   oxyCODONE-acetaminophen (PERCOCET) 10-325 mg tablet Take 1 tablet by mouth every 6 (six) hours as needed for Pain.   traZODone (DESYREL) 50 MG tablet Take 100 mg by mouth nightly   Allergies:  Celecoxib Other (Severe stomach cramps)  Cosentyx [Secukinumab] Itching   Ibuprofen Other (Gi upset)   Infliximab - Hives, Hypertension   Review of Systems:  A comprehensive 14 point ROS was performed, reviewed by me today, and the pertinent orthopaedic findings are documented in the HPI.  Physical Exam: BP 124/78  Ht 160 cm (5\' 3" )  Wt 92.1 kg (203 lb)  BMI 35.96 kg/m   General/Constitutional: The patient appears to be well-nourished, well-developed, and in no acute distress. Neuro/Psych: Normal mood and affect, oriented to person, place and time. Eyes: Non-icteric. Pupils are equal, round, and reactive to light, and exhibit synchronous movement. ENT: Unremarkable. Lymphatic: No palpable adenopathy. Respiratory: Lungs clear to auscultation, Normal chest excursion, No wheezes and Non-labored breathing Cardiovascular: Regular rate and rhythm. No murmurs. and No edema, swelling or tenderness, except as noted in detailed exam. Integumentary: No impressive skin lesions present, except as noted in detailed exam. Musculoskeletal: Unremarkable, except as noted in detailed exam.  Right knee exam: GAIT: The patient presents today in a wheelchair. ALIGNMENT: mild varus SKIN: unremarkable SWELLING: mild EFFUSION: 1+ WARMTH: no warmth TENDERNESS: moderate over the medial joint  line and moderate tenderness to palpation over lateral joint line as well ROM: 5 to 85 degrees with mild pain at the extremes of flexion and extension McMURRAY'S: equivocal PATELLOFEMORAL: normal tracking with moderate patellofemoral crepitus. CREPITUS: Moderate patellofemoral and medial LACHMAN'S: negative PIVOT SHIFT:  negative ANTERIOR DRAWER: negative POSTERIOR DRAWER: negative VARUS/VALGUS: positive pseudolaxity to varus stressing   She is neurovascularly intact to the left lower extremity and foot.  Imaging: Previous x-rays of the right knee were reviewed at today's visit. These x-rays demonstrate significant osteoarthritic changes of the right knee with complete loss of medial joint space and osteophyte formation.  Impression: Primary osteoarthritis of right knee.  Plan:  1. Treatment options were discussed today with the patient. 2. The patient is scheduled for a right total knee arthroplasty with Dr. Roland Rack on 10/26/2020. 3. The patient was instructed on the risk and benefits of surgery and wishes to proceed at this time. 4. This document will serve as a surgical history and physical for the patient. 5. The patient will follow-up per standard postop protocol. They can call the clinic they have any questions, new symptoms develop or symptoms worsen.  The procedure was discussed with the patient, as were the potential risks (including bleeding, infection, nerve and/or blood vessel injury, persistent or recurrent pain, failure of the hardware, need for further surgery, blood clots, strokes, heart attacks and/or arhythmias, pneumonia, etc.) and benefits. The patient states her understanding and wishes to proceed.   H&P reviewed and patient re-examined. No changes.

## 2020-10-26 NOTE — Anesthesia Preprocedure Evaluation (Signed)
Anesthesia Evaluation  Patient identified by MRN, date of birth, ID band Patient awake    Reviewed: Allergy & Precautions, H&P , NPO status , Patient's Chart, lab work & pertinent test results, reviewed documented beta blocker date and time   History of Anesthesia Complications (+) PONV and history of anesthetic complications  Airway Mallampati: III   Neck ROM: full    Dental  (+) Teeth Intact   Pulmonary neg pulmonary ROS, former smoker,    Pulmonary exam normal        Cardiovascular Exercise Tolerance: Poor hypertension, On Medications + CAD, + Past MI and + Cardiac Stents  negative cardio ROS Normal cardiovascular exam+ dysrhythmias  Rhythm:regular Rate:Normal     Neuro/Psych  Neuromuscular disease negative neurological ROS  negative psych ROS   GI/Hepatic negative GI ROS, Neg liver ROS, PUD, GERD  Medicated,  Endo/Other  negative endocrine ROSdiabetes, Well Controlled  Renal/GU negative Renal ROS  negative genitourinary   Musculoskeletal   Abdominal   Peds  Hematology negative hematology ROS (+)   Anesthesia Other Findings Past Medical History: 10/09/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 b.) Robotic-assisted TLH/BSO,               bilateral pelvic sentinel lymph node dissection. c.) Tx'd              with megesterol 11/2017 - 05/2019 No date: CAD (coronary artery disease)     Comment:  a. NSTEMI 04/2014; b. cardiac cath 05/30/2014: ost LAD 30%,              mLAD 95% s/p PCI/DES, ost RCA 60%, pRCA 50%, dRCA 60%, EF              >55% by echo;  c. 12/2014 NSTEMI/PCI: LM nl, LAD 30ost,               patent mid stent, D1 small, D2 min dzs, D3 small, LCX min              irregs, OM1 small, OM2 min irregs, OM3 70, RCA 60ost,               40p, 73m/10m/d, 70d (3.25x28 Xience Alpine DES), RPDA min              irregs, EF 55-65%. No date: Cervical radiculopathy No date: Cervical  spondylolysis No date: Diastolic dysfunction     Comment:  a. echo 04/2014: EF >17%, no WMA, diastolic dysfunction,               no valvular abnormalities, normal RVSP;  b. 12/2014 EF               55-65% by LV gram. No date: Gastric ulcer No date: GERD (gastroesophageal reflux disease) No date: Hidradenitis suppurativa No date: HLD (hyperlipidemia) No date: Hypertension No date: Ischemic heart disease due to coronary artery obstruction  (HCC) No date: Lumbar spondylolysis 05/27/2014: NSTEMI (non-ST elevated myocardial infarction) ([place])     Comment:  a.) LHC 05/30/2014 --> 95% mLAD --> 3.5 x 18 mm Xience               Alpine DES x 1 to mLAD. 01/02/2015: NSTEMI (non-ST elevated myocardial infarction) ([place])     Comment:  a.) LHC --> 80% m-dRCA --> 3.25 x 28 mm Xience Alpine               DES x 1 to [employer]. No  date: Obesity No date: PONV (postoperative nausea and vomiting) No date: RA (rheumatoid arthritis) ([place]) 05/09/2020: RBBB (right bundle branch block) No date: T2DM (type 2 diabetes mellitus) ([place]) Past Surgical History: No date: ABDOMINAL HYSTERECTOMY     Comment:  cancer 05/30/2014: CARDIAC CATHETERIZATION; N/A     Comment:  Procedure: Left Heart Cath and Coronary Angiography;                Surgeon: Wellington Hampshire, MD;  Location: [facility]               [facility];  Service: Cardiovascular;  Laterality: N/A; 05/30/2014: CARDIAC CATHETERIZATION; N/A     Comment:  Procedure: Coronary Stent Intervention;  Surgeon:               Wellington Hampshire, MD;  Location: [facility];                Service: Cardiovascular;  Laterality: N/A; 01/02/2015: CARDIAC CATHETERIZATION; N/A     Comment:  Procedure: Left Heart Cath and Coronary Angiography;                Surgeon: Wellington Hampshire, MD;  Location: [facility]               [facility];  Service: Cardiovascular;  Laterality: N/A; 01/02/2015: CARDIAC CATHETERIZATION; N/A     Comment:  Procedure: Coronary Stent Intervention;   Surgeon:               Wellington Hampshire, MD;  Location: [facility];                Service: Cardiovascular;  Laterality: N/A; 05/16/2020: TOTAL KNEE ARTHROPLASTY; Left     Comment:  Procedure: TOTAL KNEE ARTHROPLASTY;  Surgeon: Corky Mull, MD;  Location: [facility];  Service: Orthopedics;                Laterality: Left; BMI    Body Mass Index: 37.23 kg/m     Reproductive/Obstetrics negative OB ROS                             Anesthesia Physical Anesthesia Plan  ASA: 3  Anesthesia Plan: Spinal   Post-op Pain Management:    Induction:   PONV Risk Score and Plan: 4 or greater  Airway Management Planned:   Additional Equipment:   Intra-op Plan:   Post-operative Plan:   Informed Consent: I have reviewed the patients History and Physical, chart, labs and discussed the procedure including the risks, benefits and alternatives for the proposed anesthesia with the patient or authorized representative who has indicated his/her understanding and acceptance.     Dental Advisory Given  Plan Discussed with: CRNA  Anesthesia Plan Comments:         Anesthesia Quick Evaluation

## 2020-10-26 NOTE — Evaluation (Signed)
Physical Therapy Evaluation Patient Details Name: Karen Cervantes MRN: [id number] DOB: 1955/03/20 Today's Date: 10/26/2020  History of Present Illness  65 y/o female s/p R TKA 9/29, h/o L TKA April '22.  Clinical Impression  Pt did well with POD0 PT exam and subsequent gait training and exercises.  She was still having some very mild numbness in R LE but displayed good strength and control with exercises and did not display any buckling, LOBs, or other safety issues.  She was able to ascend/descend steps w/o physical assist and overall did well, reports feeling similar to when L knee was replaced ~5 months ago and she discharged POD0 as well.      Recommendations for follow up therapy are one component of a multi-disciplinary discharge planning process, led by the attending physician.  Recommendations may be updated based on patient status, additional functional criteria and insurance authorization.  Follow Up Recommendations Follow surgeon's recommendation for DC plan and follow-up therapies    Equipment Recommendations  None recommended by PT    Recommendations for Other Services       Precautions / Restrictions Precautions Precautions: Fall Restrictions Weight Bearing Restrictions: Yes RLE Weight Bearing: Weight bearing as tolerated      Mobility  Bed Mobility Overal bed mobility: Modified Independent             General bed mobility comments: Pt needed some extra UE use, but was able to transition supine to sit w/o assist    Transfers Overall transfer level: Modified independent Equipment used: Rolling walker (2 wheeled)             General transfer comment: Cuing to insure appropriate UE use and set up, able to rise to standing w/o assist.  Ambulation/Gait Ambulation/Gait assistance: Min guard Gait Distance (Feet): 125 Feet Assistive device: Rolling walker (2 wheeled)       General Gait Details: Pt is able to ambulate with slow but steady cadence, no  buckling but certainly reliant on the walker.  Stairs Stairs: Yes Stairs assistance: Supervision Stair Management: Two rails;One rail Right;Sideways;Forwards Number of Stairs: 4 General stair comments: Pt needed reminders for approprite sequencing, but able to negotiate up/down w/o phyiscal assist and appropriate effort.  Wheelchair Mobility    Modified Rankin (Stroke Patients Only)       Balance Overall balance assessment: Modified Independent                                           Pertinent Vitals/Pain Pain Assessment: 0-10 Pain Score: 6  Pain Location: R knee    Home Living Family/patient expects to be discharged to:: Private residence Living Arrangements: Spouse/significant other Available Help at Discharge: Family;Available 24 hours/day Type of Home: House Home Access: Stairs to enter Entrance Stairs-Rails: Can reach both Entrance Stairs-Number of Steps: 3 Home Layout: One level Home Equipment: Walker - 2 wheels;Bedside commode;Shower seat;Cane - single point      Prior Function Level of Independence: Independent         Comments: apart from acute knee pain pt has been able to remain relatively active     Hand Dominance        Extremity/Trunk Assessment   Upper Extremity Assessment Upper Extremity Assessment: Overall WFL for tasks assessed    Lower Extremity Assessment Lower Extremity Assessment: Overall WFL for tasks assessed (expected post-op weakness, but able to do SLRs)  Communication   Communication: No difficulties  Cognition Arousal/Alertness: Awake/alert Behavior During Therapy: WFL for tasks assessed/performed Overall Cognitive Status: Within Functional Limits for tasks assessed                                        General Comments      Exercises Total Joint Exercises Ankle Circles/Pumps: AROM;10 reps Quad Sets: Strengthening;10 reps Short Arc Quad: Strengthening;10 reps Heel  Slides: AROM;Strengthening;5 reps (with resisted leg ext) Hip ABduction/ADduction: Strengthening;10 reps Straight Leg Raises: AROM;10 reps Knee Flexion: PROM;5 reps Goniometric ROM: 0-89   Assessment/Plan    PT Assessment Patient needs continued PT services  PT Problem List Decreased strength;Decreased activity tolerance;Decreased range of motion;Decreased balance;Decreased mobility;Decreased knowledge of use of DME;Decreased safety awareness;Pain       PT Treatment Interventions DME instruction;Gait training;Stair training;Functional mobility training;Therapeutic activities;Therapeutic exercise;Balance training;Cognitive remediation;Patient/family education    PT Goals (Current goals can be found in the Care Plan section)  Acute Rehab PT Goals Patient Stated Goal: Go home PT Goal Formulation: With patient Time For Goal Achievement: 11/09/20 Potential to Achieve Goals: Good    Frequency BID   Barriers to discharge        Co-evaluation               AM-PAC PT "6 Clicks" Mobility  Outcome Measure Help needed turning from your back to your side while in a flat bed without using bedrails?: None Help needed moving from lying on your back to sitting on the side of a flat bed without using bedrails?: None Help needed moving to and from a bed to a chair (including a wheelchair)?: None Help needed standing up from a chair using your arms (e.g., wheelchair or bedside chair)?: None Help needed to walk in hospital room?: A Little Help needed climbing 3-5 steps with a railing? : A Little 6 Click Score: 22    End of Session Equipment Utilized During Treatment: Gait belt Activity Tolerance: Patient tolerated treatment well Patient left: in chair;with call bell/phone within reach;with nursing/sitter in room;with family/visitor present Nurse Communication: Mobility status PT Visit Diagnosis: Muscle weakness (generalized) (M62.81);Difficulty in walking, not elsewhere classified  (R26.2);Pain Pain - Right/Left: Right Pain - part of body: Knee    Time: 1610-9604 PT Time Calculation (min) (ACUTE ONLY): 34 min   Charges:   PT Evaluation $PT Eval Low Complexity: 1 Low PT Treatments $Gait Training: 8-22 mins $Therapeutic Exercise: 8-22 mins        Kreg Shropshire, DPT 10/26/2020, 3:52 PM

## 2020-10-26 NOTE — Progress Notes (Signed)
Patient and daughter verbalize understanding of post op instructions. Patient has a bedside commode and walker at home,

## 2020-10-26 NOTE — Op Note (Signed)
10/26/2020  10:02 AM  Patient:   Karen Cervantes  Pre-Op Diagnosis:   Degenerative joint disease, right knee.  Post-Op Diagnosis:   Same  Procedure:   Right TKA using all-cemented [employer] Vanguard system with a 62.5 mm PCR femur, a 71 mm tibial tray with a 10 mm anterior stabilized E-poly insert, and a 34 x 7.8 mm all-poly 3-pegged domed patella.  Surgeon:   Pascal Lux, MD  Assistant:   Cameron Proud, PA-C; Rennis Chris, PA-S  Anesthesia:   Spinal  Findings:   As above  Complications:   None  EBL:   5 cc  Fluids:   400 cc crystalloid  UOP:   None  TT:   100 minutes at 300 mmHg  Drains:   None  Closure:   Staples  Implants:   As above  Brief Clinical Note:   The patient is a 65 year old female with a long history of progressively worsening right knee pain. The patient's symptoms have progressed despite medications, activity modification, injections, etc. The patient's history and examination were consistent with advanced degenerative joint disease of the right knee confirmed by plain radiographs. The patient presents at this time for a right total knee arthroplasty.  Procedure:   The patient was brought into the operating room. After adequate spinal anesthesia was obtained, the patient was lain in the supine position before the right lower extremity was prepped with ChloraPrep solution and draped sterilely. Preoperative antibiotics were administered. After verifying the proper laterality with a surgical timeout, the limb was exsanguinated with an Esmarch and the tourniquet inflated to 300 mmHg.   A standard anterior approach to the knee was made through an approximately 7-8 inch incision. The incision was carried down through the subcutaneous tissues to expose superficial retinaculum. This was split the length of the incision and the medial flap elevated sufficiently to expose the medial retinaculum. The medial retinaculum was incised, leaving a 3-4 mm cuff of tissue on the  patella. This was extended distally along the medial border of the patellar tendon and proximally through the medial third of the quadriceps tendon. A subtotal fat pad excision was performed before the soft tissues were elevated off the anteromedial and anterolateral aspects of the proximal tibia to the level of the collateral ligaments. The anterior portions of the medial and lateral menisci were removed, as was the anterior cruciate ligament. With the knee flexed to 90, the external tibial guide was positioned and the appropriate proximal tibial cut made. This piece was taken to the back table where it was measured and found to be optimally replicated by a 71 mm component.  Attention was directed to the distal femur. The intramedullary canal was accessed through a 3/8" drill hole. The intramedullary guide was inserted and positioned in order to obtain a neutral flexion gap. The intercondylar block was positioned with care taken to avoid notching the anterior cortex of the femur. The appropriate cut was made. Next, the distal cutting block was placed at 5 of valgus alignment. Using the 9 mm slot, the distal cut was made. The distal femur was measured and found to be optimally replicated by the 67.6 mm component. The 62.5 mm 4-in-1 cutting block was positioned and first the posterior, then the posterior chamfer, the anterior chamfer, and finally the anterior cuts were made. At this point, the posterior portions medial and lateral menisci were removed. A trial reduction was performed using the appropriate femoral and tibial components with the 10 mm insert. This  demonstrated excellent stability to varus and valgus stressing both in flexion and extension while permitting full extension. Patella tracking was assessed and found to be excellent. Therefore, the tibial guide position was marked on the proximal tibia. The patella thickness was measured and found to be 20 mm. Therefore, the appropriate cut was made. The  patellar surface was measured and found to be optimally replicated by the 34 mm component. The three peg holes were drilled in place before the trial button was inserted. Patella tracking was assessed and found to be excellent, passing the "no thumb test". The lug holes were drilled into the distal femur before the trial component was removed, leaving only the tibial tray. The keel was then created using the appropriate tower, reamer, and punch.  The bony surfaces were prepared for cementing by irrigating them thoroughly with sterile saline solution via the jet lavage system. A bone plug was fashioned from some of the bone that had been removed previously and used to plug the distal femoral canal. In addition, 20 cc of Exparel diluted out to 60 cc with normal saline and 30 cc of 0.5% Sensorcaine were injected into the postero-medial and postero-lateral aspects of the knee, the medial and lateral gutter regions, and the peri-incisional tissues to help with postoperative analgesia. Meanwhile, the cement was being mixed on the back table. When it was ready, the tibial tray was cemented in first. The excess cement was removed using [employer] fast streamer. Next, the femoral component was impacted into place. Again, the excess cement was removed using [employer] fast streamer. The 10 mm trial insert was positioned and the knee brought into extension while the cement hardened. Finally, the patella was cemented into place and secured using the patellar clamp. Again, the excess cement was removed using [employer] fast streamer. Once the cement had hardened, the knee was placed through a range of motion with the findings as described above. Therefore, the trial insert was removed and, after verifying that no cement had been retained posteriorly, the permanent 10 mm anterior stabilized E-polyethylene insert was positioned and secured using the appropriate key locking mechanism. Again the knee was placed through a range of motion with the findings as  described above.  The wound was copiously irrigated with sterile saline solution using the jet lavage system before the quadriceps tendon and retinacular layer were reapproximated using #0 Vicryl interrupted sutures. The superficial retinacular layer also was closed using a running #0 Vicryl suture. A total of 10 cc of transexemic acid (TXA) was injected intra-articularly before the subcutaneous tissues were closed in several layers using 2-0 Vicryl interrupted sutures. The skin was closed using staples. A sterile honeycomb dressing was applied to the skin before the leg was wrapped with an Ace wrap to accommodate the Polar Care device. The patient was then awakened and returned to the recovery room in satisfactory condition after tolerating the procedure well.

## 2020-10-26 NOTE — Discharge Instructions (Addendum)
Orthopedic discharge instructions: May shower with intact OpSite dressing. Apply ice frequently to knee or use Polar Care. Take baby aspirin (81 mg) 2 tablets twice daily for 6 weeks, then may resume 1 tablet daily Take oxycodone as prescribed when needed.  May supplement with ES Tylenol if necessary. May weight-bear as tolerated on right leg - use walker for balance and support. Follow-up in 10-14 days or as scheduled.AMBULATORY SURGERY  DISCHARGE INSTRUCTIONS   The drugs that you were given will stay in your system until tomorrow so for the next 24 hours you should not:  Drive an automobile Make any legal decisions Drink any alcoholic beverage   You may resume regular meals tomorrow.  Today it is better to start with liquids and gradually work up to solid foods.  You may eat anything you prefer, but it is better to start with liquids, then soup and crackers, and gradually work up to solid foods.   Please notify your doctor immediately if you have any unusual bleeding, trouble breathing, redness and pain at the surgery site, drainage, fever, or pain not relieved by medication.    Additional Instructions:        Please contact your physician with any problems or Same Day Surgery at [phone], Monday through Friday 6 am to 4 pm, or [place] [facility] number at [phone].

## 2020-10-26 NOTE — Transfer of Care (Signed)
Immediate Anesthesia Transfer of Care Note  Patient: Karen Cervantes  Procedure(s) Performed: TOTAL KNEE ARTHROPLASTY (Right: Knee)  Patient Location: Endoscopy Unit  Anesthesia Type:General  Level of Consciousness: awake, drowsy and patient cooperative  Airway & Oxygen Therapy: Patient Spontanous Breathing and Patient connected to face mask oxygen  Post-op Assessment: Report given to RN and Post -op Vital signs reviewed and stable  Post vital signs: Reviewed and stable  Last Vitals:  Vitals Value Taken Time  BP 95/41 10/26/20 1005  Temp    Pulse 79 10/26/20 1006  Resp 13 10/26/20 1006  SpO2 99 % 10/26/20 1006  Vitals shown include unvalidated device data.  Last Pain:  Vitals:   10/26/20 0622  TempSrc: Tympanic  PainSc: 0-No pain         Complications: No notable events documented.

## 2020-10-26 NOTE — Anesthesia Procedure Notes (Signed)
Procedure Name: General with mask airway Date/Time: 10/26/2020 7:56 AM Performed by: Kelton Pillar, CRNA Pre-anesthesia Checklist: Patient identified, Emergency Drugs available, Suction available and Patient being monitored Patient Re-evaluated:Patient Re-evaluated prior to induction Oxygen Delivery Method: Simple face mask Induction Type: IV induction Placement Confirmation: positive ETCO2 and CO2 detector Dental Injury: Teeth and Oropharynx as per pre-operative assessment

## 2020-10-26 NOTE — Progress Notes (Signed)
Right knee xray done in PACU

## 2020-10-27 ENCOUNTER — Encounter: Payer: Self-pay | Admitting: Surgery

## 2020-10-31 NOTE — Anesthesia Postprocedure Evaluation (Signed)
Anesthesia Post Note  Patient: MARSHA GUNDLACH  Procedure(s) Performed: TOTAL KNEE ARTHROPLASTY (Right: Knee)  Patient location during evaluation: PACU Anesthesia Type: Spinal Level of consciousness: awake and alert Pain management: pain level controlled Vital Signs Assessment: post-procedure vital signs reviewed and stable Respiratory status: spontaneous breathing, nonlabored ventilation, respiratory function stable and patient connected to nasal cannula oxygen Cardiovascular status: blood pressure returned to baseline and stable Postop Assessment: no apparent nausea or vomiting Anesthetic complications: no   No notable events documented.   Last Vitals:  Vitals:   10/26/20 1325 10/26/20 1443  BP: 122/74 135/74  Pulse: 86   Resp: 20 20  Temp: (!) 36.1 C 36.6 C  SpO2: 99% 99%    Last Pain:  Vitals:   10/26/20 1325  TempSrc: Temporal  PainSc:                  Molli Barrows

## 2022-03-28 ENCOUNTER — Other Ambulatory Visit: Payer: Self-pay | Admitting: Family Medicine

## 2022-03-28 DIAGNOSIS — Z1231 Encounter for screening mammogram for malignant neoplasm of breast: Secondary | ICD-10-CM

## 2022-07-02 ENCOUNTER — Ambulatory Visit
Admission: RE | Admit: 2022-07-02 | Discharge: 2022-07-02 | Disposition: A | Discharge status: Home / Self Care | Payer: Medicare Other | Source: Ambulatory Visit | Attending: Family Medicine | Admitting: Family Medicine

## 2022-07-02 DIAGNOSIS — Z1231 Encounter for screening mammogram for malignant neoplasm of breast: Secondary | ICD-10-CM | POA: Insufficient documentation

## 2022-11-22 IMAGING — DX DG KNEE 1-2V PORT*L*
2 series · 2 of 2 positions shown · non-contrast
Comparison: None.

CLINICAL DATA: Left knee arthroplasty

EXAM:
PORTABLE LEFT KNEE - 1-2 VIEW

[knee ap]
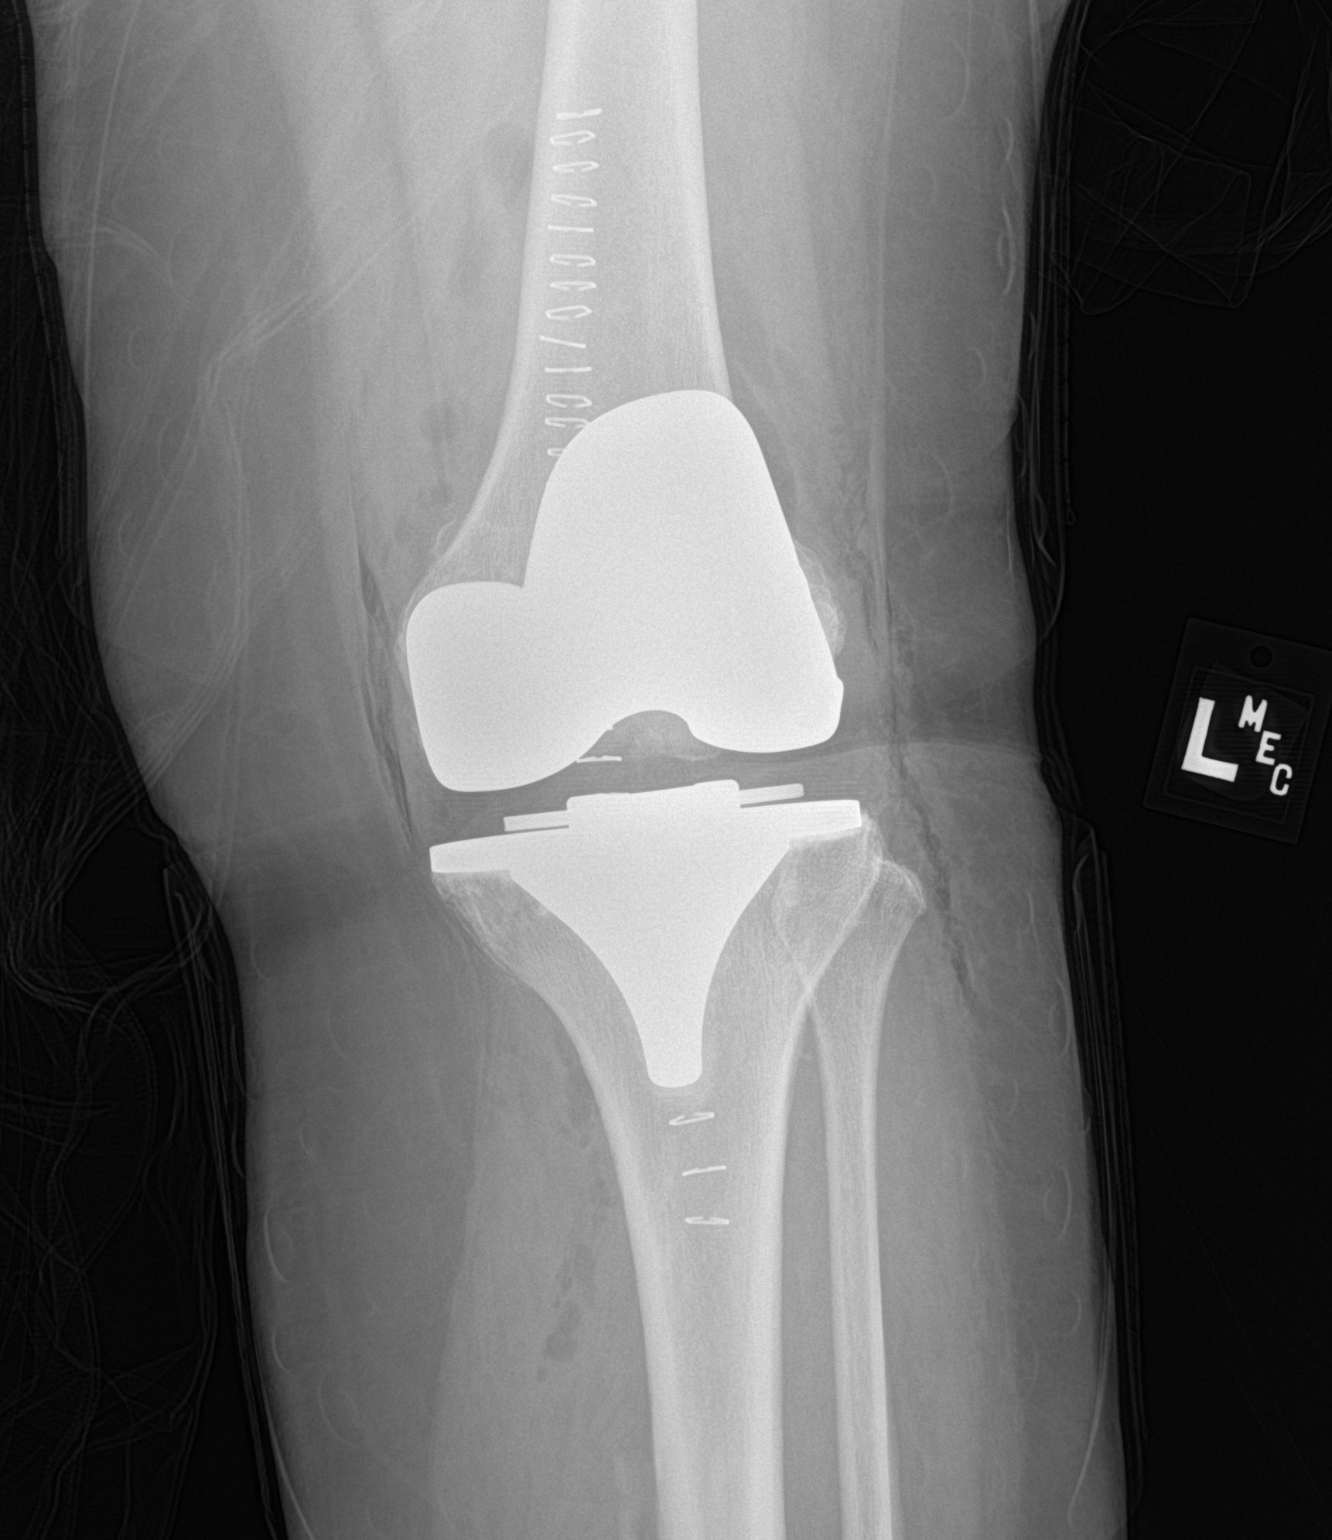

[knee lat]
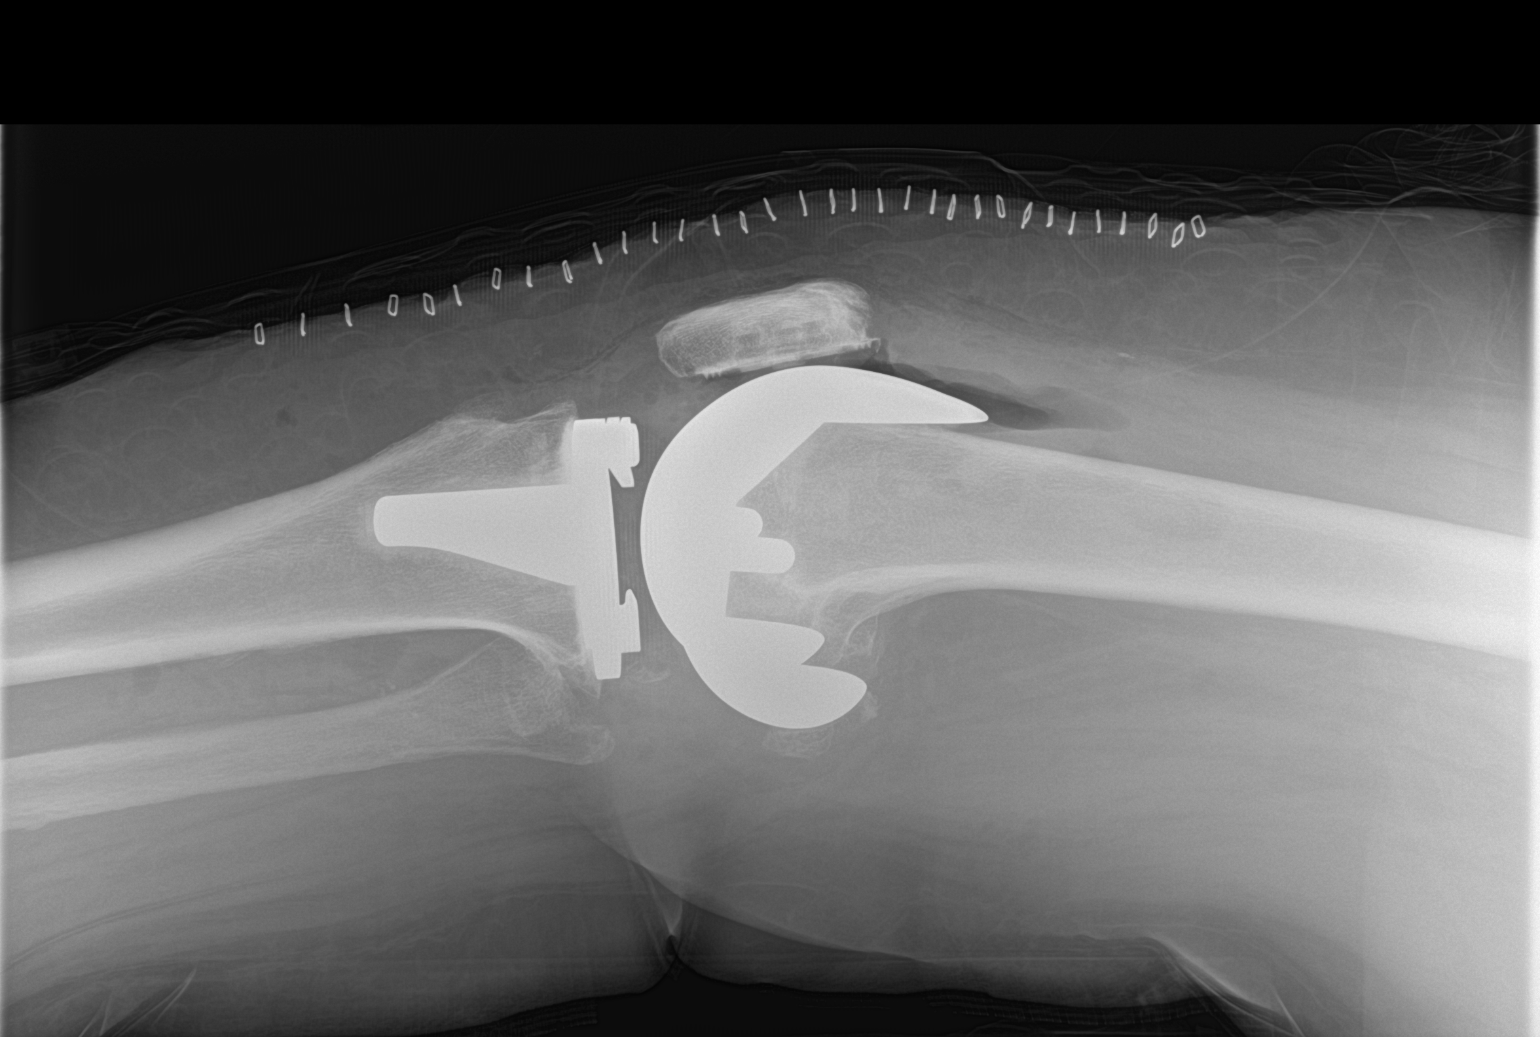

[2 of 2 positions shown; findings below may reference images not displayed]

FINDINGS: Status post left total knee arthroplasty. Arthroplasty components
are in their expected alignment without periprosthetic fracture.
Expected postoperative changes to the overlying soft tissues.
IMPRESSION: Satisfactory postoperative appearance status post left total knee
arthroplasty.

## 2023-08-22 ENCOUNTER — Encounter: Payer: Self-pay | Admitting: Family Medicine

## 2023-08-27 ENCOUNTER — Other Ambulatory Visit: Payer: Self-pay | Admitting: Family Medicine

## 2023-08-27 ENCOUNTER — Ambulatory Visit
Admission: RE | Admit: 2023-08-27 | Discharge: 2023-08-27 | Disposition: A | Discharge status: Home / Self Care | Source: Ambulatory Visit | Attending: Family Medicine | Admitting: Family Medicine

## 2023-08-27 DIAGNOSIS — M25519 Pain in unspecified shoulder: Secondary | ICD-10-CM | POA: Diagnosis present
